# Patient Record
Sex: Male | Born: 1965 | Race: White | Hispanic: No | Marital: Married | Smoking: Current every day smoker
Health system: Southern US, Community
[De-identification: ages and names within clinical notes are randomized; demographics above are authoritative.]

## PROBLEM LIST (undated history)

## (undated) DIAGNOSIS — K219 Gastro-esophageal reflux disease without esophagitis: Secondary | ICD-10-CM

## (undated) DIAGNOSIS — J189 Pneumonia, unspecified organism: Secondary | ICD-10-CM

## (undated) DIAGNOSIS — M542 Cervicalgia: Secondary | ICD-10-CM

## (undated) DIAGNOSIS — R200 Anesthesia of skin: Secondary | ICD-10-CM

## (undated) DIAGNOSIS — J45909 Unspecified asthma, uncomplicated: Secondary | ICD-10-CM

## (undated) DIAGNOSIS — F419 Anxiety disorder, unspecified: Secondary | ICD-10-CM

## (undated) DIAGNOSIS — I509 Heart failure, unspecified: Secondary | ICD-10-CM

## (undated) DIAGNOSIS — I10 Essential (primary) hypertension: Secondary | ICD-10-CM

## (undated) DIAGNOSIS — D518 Other vitamin B12 deficiency anemias: Secondary | ICD-10-CM

## (undated) DIAGNOSIS — E785 Hyperlipidemia, unspecified: Secondary | ICD-10-CM

## (undated) DIAGNOSIS — J439 Emphysema, unspecified: Secondary | ICD-10-CM

## (undated) DIAGNOSIS — G629 Polyneuropathy, unspecified: Secondary | ICD-10-CM

## (undated) DIAGNOSIS — G2581 Restless legs syndrome: Secondary | ICD-10-CM

## (undated) DIAGNOSIS — R202 Paresthesia of skin: Secondary | ICD-10-CM

## (undated) DIAGNOSIS — J449 Chronic obstructive pulmonary disease, unspecified: Secondary | ICD-10-CM

## (undated) DIAGNOSIS — G4700 Insomnia, unspecified: Secondary | ICD-10-CM

## (undated) DIAGNOSIS — G894 Chronic pain syndrome: Secondary | ICD-10-CM

## (undated) HISTORY — PX: OTHER SURGICAL HISTORY: SHX169

## (undated) HISTORY — DX: Restless legs syndrome: G25.81

## (undated) HISTORY — DX: Polyneuropathy, unspecified: G62.9

## (undated) HISTORY — DX: Heart failure, unspecified: I50.9

## (undated) HISTORY — DX: Cervicalgia: M54.2

## (undated) HISTORY — DX: Anxiety disorder, unspecified: F41.9

## (undated) HISTORY — DX: Essential (primary) hypertension: I10

## (undated) HISTORY — DX: Anesthesia of skin: R20.2

## (undated) HISTORY — DX: Anesthesia of skin: R20.0

## (undated) HISTORY — DX: Hyperlipidemia, unspecified: E78.5

## (undated) HISTORY — DX: Chronic pain syndrome: G89.4

## (undated) HISTORY — DX: Other vitamin B12 deficiency anemias: D51.8

## (undated) HISTORY — DX: Insomnia, unspecified: G47.00

## (undated) HISTORY — PX: APPENDECTOMY: SHX54

## (undated) HISTORY — DX: Gastro-esophageal reflux disease without esophagitis: K21.9

---

## 2005-08-30 HISTORY — PX: APPENDECTOMY: SHX54

## 2006-08-30 HISTORY — PX: BACK SURGERY: SHX140

## 2006-08-30 HISTORY — PX: ELBOW SURGERY: SHX618

## 2013-02-28 ENCOUNTER — Other Ambulatory Visit: Payer: Self-pay | Admitting: Neurosurgery

## 2013-02-28 DIAGNOSIS — M5412 Radiculopathy, cervical region: Secondary | ICD-10-CM

## 2013-03-07 ENCOUNTER — Ambulatory Visit
Admission: RE | Admit: 2013-03-07 | Discharge: 2013-03-07 | Disposition: A | Discharge status: Home / Self Care | Payer: Medicaid Other | Source: Ambulatory Visit | Attending: Neurosurgery | Admitting: Neurosurgery

## 2013-03-07 VITALS — BP 113/72 | HR 63

## 2013-03-07 DIAGNOSIS — M5412 Radiculopathy, cervical region: Secondary | ICD-10-CM

## 2013-03-07 MED ORDER — ONDANSETRON HCL 4 MG/2ML IJ SOLN
4.0000 mg | Freq: Once | INTRAMUSCULAR | Status: AC
  Administered 2013-03-07: 4 mg via INTRAMUSCULAR

## 2013-03-07 MED ORDER — DIAZEPAM 5 MG PO TABS
10.0000 mg | ORAL_TABLET | Freq: Once | ORAL | Status: AC
  Administered 2013-03-07: 10 mg via ORAL

## 2013-03-07 MED ORDER — MEPERIDINE HCL 100 MG/ML IJ SOLN
75.0000 mg | Freq: Once | INTRAMUSCULAR | Status: AC
  Administered 2013-03-07: 75 mg via INTRAMUSCULAR

## 2013-03-07 MED ORDER — IOHEXOL 300 MG/ML  SOLN
10.0000 mL | Freq: Once | INTRAMUSCULAR | Status: AC | PRN
  Administered 2013-03-07: 10 mL via INTRATHECAL

## 2013-03-07 NOTE — Progress Notes (Signed)
Discharge instructions explained to pt and his wife. 

## 2013-05-01 ENCOUNTER — Encounter (HOSPITAL_COMMUNITY): Payer: Self-pay | Admitting: Pharmacy Technician

## 2013-05-01 ENCOUNTER — Other Ambulatory Visit: Payer: Self-pay | Admitting: Neurosurgery

## 2013-05-02 ENCOUNTER — Encounter (HOSPITAL_COMMUNITY): Payer: Self-pay | Admitting: *Deleted

## 2013-05-03 ENCOUNTER — Ambulatory Visit (HOSPITAL_COMMUNITY)
Admission: RE | Admit: 2013-05-03 | Discharge: 2013-05-04 | Disposition: A | Discharge status: Home / Self Care | Payer: Medicaid Other | Source: Ambulatory Visit | Attending: Neurosurgery | Admitting: Neurosurgery

## 2013-05-03 ENCOUNTER — Encounter (HOSPITAL_COMMUNITY): Payer: Self-pay | Admitting: Anesthesiology

## 2013-05-03 ENCOUNTER — Encounter (HOSPITAL_COMMUNITY): Payer: Self-pay | Admitting: *Deleted

## 2013-05-03 ENCOUNTER — Ambulatory Visit (HOSPITAL_COMMUNITY): Payer: Medicaid Other

## 2013-05-03 ENCOUNTER — Encounter (HOSPITAL_COMMUNITY)
Admission: RE | Disposition: A | Discharge status: Home / Self Care | Payer: Medicaid Other | Source: Ambulatory Visit | Attending: Neurosurgery

## 2013-05-03 ENCOUNTER — Ambulatory Visit (HOSPITAL_COMMUNITY): Payer: Medicaid Other | Admitting: Anesthesiology

## 2013-05-03 DIAGNOSIS — M5020 Other cervical disc displacement, unspecified cervical region: Secondary | ICD-10-CM

## 2013-05-03 DIAGNOSIS — M47812 Spondylosis without myelopathy or radiculopathy, cervical region: Secondary | ICD-10-CM | POA: Insufficient documentation

## 2013-05-03 DIAGNOSIS — J449 Chronic obstructive pulmonary disease, unspecified: Secondary | ICD-10-CM | POA: Insufficient documentation

## 2013-05-03 DIAGNOSIS — J4489 Other specified chronic obstructive pulmonary disease: Secondary | ICD-10-CM | POA: Insufficient documentation

## 2013-05-03 HISTORY — DX: Unspecified asthma, uncomplicated: J45.909

## 2013-05-03 HISTORY — PX: ANTERIOR CERVICAL DECOMP/DISCECTOMY FUSION: SHX1161

## 2013-05-03 HISTORY — DX: Emphysema, unspecified: J43.9

## 2013-05-03 HISTORY — DX: Chronic obstructive pulmonary disease, unspecified: J44.9

## 2013-05-03 LAB — CBC
HCT: 44.4 % (ref 39.0–52.0)
Hemoglobin: 16 g/dL (ref 13.0–17.0)
MCH: 31.1 pg (ref 26.0–34.0)
MCHC: 36 g/dL (ref 30.0–36.0)

## 2013-05-03 LAB — BASIC METABOLIC PANEL
BUN: 14 mg/dL (ref 6–23)
GFR calc non Af Amer: >90 mL/min (ref >90)
Glucose, Bld: 101 mg/dL — ABNORMAL HIGH (ref 70–99)
Potassium: 4.2 mEq/L (ref 3.5–5.1)

## 2013-05-03 SURGERY — ANTERIOR CERVICAL DECOMPRESSION/DISCECTOMY FUSION 1 LEVEL
Anesthesia: General | Wound class: Clean

## 2013-05-03 MED ORDER — HYDROMORPHONE HCL PF 1 MG/ML IJ SOLN
0.2500 mg | INTRAMUSCULAR | Status: DC | PRN
  Administered 2013-05-03 (×4): 0.5 mg via INTRAVENOUS

## 2013-05-03 MED ORDER — MOMETASONE FURO-FORMOTEROL FUM 100-5 MCG/ACT IN AERO
2.0000 | INHALATION_SPRAY | Freq: Two times a day (BID) | RESPIRATORY_TRACT | Status: DC
  Administered 2013-05-03 – 2013-05-04 (×2): 2 via RESPIRATORY_TRACT
  Filled 2013-05-03 (×2): qty 8.8

## 2013-05-03 MED ORDER — HEMOSTATIC AGENTS (NO CHARGE) OPTIME
TOPICAL | Status: DC | PRN
  Administered 2013-05-03: 1 via TOPICAL

## 2013-05-03 MED ORDER — ROCURONIUM BROMIDE 100 MG/10ML IV SOLN
INTRAVENOUS | Status: DC | PRN
  Administered 2013-05-03: 50 mg via INTRAVENOUS

## 2013-05-03 MED ORDER — PHENOL 1.4 % MT LIQD: 1.0000 | OROMUCOSAL | Status: DC | PRN

## 2013-05-03 MED ORDER — CYCLOBENZAPRINE HCL 10 MG PO TABS
ORAL_TABLET | ORAL | Status: AC
  Filled 2013-05-03: qty 1

## 2013-05-03 MED ORDER — MIDAZOLAM HCL 5 MG/5ML IJ SOLN
INTRAMUSCULAR | Status: DC | PRN
  Administered 2013-05-03: 2 mg via INTRAVENOUS

## 2013-05-03 MED ORDER — ONDANSETRON HCL 4 MG/2ML IJ SOLN: 4.0000 mg | Freq: Once | INTRAMUSCULAR | Status: DC | PRN

## 2013-05-03 MED ORDER — SODIUM CHLORIDE 0.9 % IJ SOLN: 3.0000 mL | INTRAMUSCULAR | Status: DC | PRN

## 2013-05-03 MED ORDER — NEOSTIGMINE METHYLSULFATE 1 MG/ML IJ SOLN
INTRAMUSCULAR | Status: DC | PRN
  Administered 2013-05-03: 4 mg via INTRAVENOUS

## 2013-05-03 MED ORDER — PHENYLEPHRINE HCL 10 MG/ML IJ SOLN
INTRAMUSCULAR | Status: DC | PRN
  Administered 2013-05-03: 40 ug via INTRAVENOUS

## 2013-05-03 MED ORDER — FENTANYL CITRATE 0.05 MG/ML IJ SOLN
INTRAMUSCULAR | Status: DC | PRN
  Administered 2013-05-03: 100 ug via INTRAVENOUS
  Administered 2013-05-03: 50 ug via INTRAVENOUS
  Administered 2013-05-03: 100 ug via INTRAVENOUS
  Administered 2013-05-03: 50 ug via INTRAVENOUS

## 2013-05-03 MED ORDER — OXYCODONE HCL 5 MG PO TABS
5.0000 mg | ORAL_TABLET | Freq: Once | ORAL | Status: AC | PRN
  Administered 2013-05-03: 5 mg via ORAL

## 2013-05-03 MED ORDER — ZOLPIDEM TARTRATE 5 MG PO TABS: 5.0000 mg | ORAL_TABLET | Freq: Every evening | ORAL | Status: DC | PRN

## 2013-05-03 MED ORDER — HYDROMORPHONE HCL PF 1 MG/ML IJ SOLN
INTRAMUSCULAR | Status: AC
  Filled 2013-05-03: qty 1

## 2013-05-03 MED ORDER — 0.9 % SODIUM CHLORIDE (POUR BTL) OPTIME
TOPICAL | Status: DC | PRN
  Administered 2013-05-03: 1000 mL

## 2013-05-03 MED ORDER — CEFAZOLIN SODIUM-DEXTROSE 2-3 GM-% IV SOLR
INTRAVENOUS | Status: AC
  Administered 2013-05-03: 2 g via INTRAVENOUS
  Filled 2013-05-03: qty 50

## 2013-05-03 MED ORDER — ALBUTEROL SULFATE HFA 108 (90 BASE) MCG/ACT IN AERS
INHALATION_SPRAY | RESPIRATORY_TRACT | Status: DC | PRN
  Administered 2013-05-03: 4 via RESPIRATORY_TRACT

## 2013-05-03 MED ORDER — THROMBIN 5000 UNITS EX SOLR
CUTANEOUS | Status: DC | PRN
  Administered 2013-05-03 (×2): 5000 [IU] via TOPICAL

## 2013-05-03 MED ORDER — CEFAZOLIN SODIUM-DEXTROSE 2-3 GM-% IV SOLR
2.0000 g | Freq: Three times a day (TID) | INTRAVENOUS | Status: AC
  Administered 2013-05-03 – 2013-05-04 (×2): 2 g via INTRAVENOUS
  Filled 2013-05-03 (×2): qty 50

## 2013-05-03 MED ORDER — ACETAMINOPHEN 650 MG RE SUPP: 650.0000 mg | RECTAL | Status: DC | PRN

## 2013-05-03 MED ORDER — MEPERIDINE HCL 25 MG/ML IJ SOLN: 6.2500 mg | INTRAMUSCULAR | Status: DC | PRN

## 2013-05-03 MED ORDER — HYDROMORPHONE HCL PF 1 MG/ML IJ SOLN
1.0000 mg | INTRAMUSCULAR | Status: DC | PRN
  Administered 2013-05-03 – 2013-05-04 (×3): 1 mg via INTRAMUSCULAR
  Filled 2013-05-03 (×3): qty 1

## 2013-05-03 MED ORDER — VECURONIUM BROMIDE 10 MG IV SOLR
INTRAVENOUS | Status: DC | PRN
  Administered 2013-05-03: 2 mg via INTRAVENOUS

## 2013-05-03 MED ORDER — PROPOFOL 10 MG/ML IV BOLUS
INTRAVENOUS | Status: DC | PRN
  Administered 2013-05-03: 120 mg via INTRAVENOUS

## 2013-05-03 MED ORDER — SODIUM CHLORIDE 0.9 % IJ SOLN
3.0000 mL | Freq: Two times a day (BID) | INTRAMUSCULAR | Status: DC
  Administered 2013-05-03: 3 mL via INTRAVENOUS

## 2013-05-03 MED ORDER — DEXAMETHASONE SODIUM PHOSPHATE 4 MG/ML IJ SOLN
4.0000 mg | Freq: Four times a day (QID) | INTRAMUSCULAR | Status: AC
  Administered 2013-05-03: 4 mg via INTRAVENOUS
  Filled 2013-05-03: qty 1

## 2013-05-03 MED ORDER — OXYCODONE HCL 5 MG PO TABS
ORAL_TABLET | ORAL | Status: AC
  Filled 2013-05-03: qty 1

## 2013-05-03 MED ORDER — LACTATED RINGERS IV SOLN
INTRAVENOUS | Status: DC | PRN
  Administered 2013-05-03 (×2): via INTRAVENOUS

## 2013-05-03 MED ORDER — CYCLOBENZAPRINE HCL 10 MG PO TABS
10.0000 mg | ORAL_TABLET | Freq: Three times a day (TID) | ORAL | Status: DC | PRN
  Administered 2013-05-03 – 2013-05-04 (×2): 10 mg via ORAL
  Filled 2013-05-03: qty 1

## 2013-05-03 MED ORDER — MUPIROCIN 2 % EX OINT
TOPICAL_OINTMENT | CUTANEOUS | Status: AC
  Filled 2013-05-03: qty 22

## 2013-05-03 MED ORDER — ROFLUMILAST 500 MCG PO TABS
500.0000 ug | ORAL_TABLET | Freq: Every day | ORAL | Status: DC
  Administered 2013-05-03 – 2013-05-04 (×2): 500 ug via ORAL
  Filled 2013-05-03 (×2): qty 1

## 2013-05-03 MED ORDER — ALBUTEROL SULFATE (5 MG/ML) 0.5% IN NEBU
2.5000 mg | INHALATION_SOLUTION | Freq: Four times a day (QID) | RESPIRATORY_TRACT | Status: DC | PRN
  Administered 2013-05-04: 2.5 mg via RESPIRATORY_TRACT
  Filled 2013-05-03: qty 0.5

## 2013-05-03 MED ORDER — KCL IN DEXTROSE-NACL 20-5-0.45 MEQ/L-%-% IV SOLN
80.0000 mL/h | INTRAVENOUS | Status: DC
  Administered 2013-05-03: 80 mL/h via INTRAVENOUS
  Filled 2013-05-03 (×3): qty 1000

## 2013-05-03 MED ORDER — HYDROCODONE-ACETAMINOPHEN 5-325 MG PO TABS
1.0000 | ORAL_TABLET | ORAL | Status: DC | PRN
  Administered 2013-05-03 – 2013-05-04 (×4): 2 via ORAL
  Filled 2013-05-03 (×4): qty 2

## 2013-05-03 MED ORDER — ACETAMINOPHEN 325 MG PO TABS: 650.0000 mg | ORAL_TABLET | ORAL | Status: DC | PRN

## 2013-05-03 MED ORDER — MENTHOL 3 MG MT LOZG: 1.0000 | LOZENGE | OROMUCOSAL | Status: DC | PRN

## 2013-05-03 MED ORDER — DEXAMETHASONE 4 MG PO TABS
4.0000 mg | ORAL_TABLET | Freq: Four times a day (QID) | ORAL | Status: AC
  Administered 2013-05-04: 4 mg via ORAL
  Filled 2013-05-03 (×2): qty 1

## 2013-05-03 MED ORDER — SODIUM CHLORIDE 0.9 % IR SOLN
Status: DC | PRN
  Administered 2013-05-03: 16:00:00

## 2013-05-03 MED ORDER — GLYCOPYRROLATE 0.2 MG/ML IJ SOLN
INTRAMUSCULAR | Status: DC | PRN
  Administered 2013-05-03: 0.6 mg via INTRAVENOUS

## 2013-05-03 MED ORDER — ALBUTEROL SULFATE HFA 108 (90 BASE) MCG/ACT IN AERS
2.0000 | INHALATION_SPRAY | Freq: Four times a day (QID) | RESPIRATORY_TRACT | Status: DC | PRN
  Filled 2013-05-03: qty 6.7

## 2013-05-03 MED ORDER — ONDANSETRON HCL 4 MG/2ML IJ SOLN: 4.0000 mg | INTRAMUSCULAR | Status: DC | PRN

## 2013-05-03 MED ORDER — IPRATROPIUM BROMIDE 0.02 % IN SOLN
500.0000 ug | Freq: Four times a day (QID) | RESPIRATORY_TRACT | Status: DC | PRN
  Administered 2013-05-04: 500 ug via RESPIRATORY_TRACT
  Filled 2013-05-03: qty 2.5

## 2013-05-03 MED ORDER — ALPRAZOLAM 0.5 MG PO TABS
0.5000 mg | ORAL_TABLET | Freq: Three times a day (TID) | ORAL | Status: DC
  Administered 2013-05-03 – 2013-05-04 (×2): 0.5 mg via ORAL
  Filled 2013-05-03 (×2): qty 1

## 2013-05-03 MED ORDER — LIDOCAINE HCL (CARDIAC) 20 MG/ML IV SOLN
INTRAVENOUS | Status: DC | PRN
  Administered 2013-05-03: 80 mg via INTRATRACHEAL
  Administered 2013-05-03: 100 mg via INTRAVENOUS

## 2013-05-03 MED ORDER — MUPIROCIN CALCIUM 2 % EX CREA
TOPICAL_CREAM | Freq: Two times a day (BID) | CUTANEOUS | Status: DC
  Filled 2013-05-03: qty 15

## 2013-05-03 MED ORDER — OXYCODONE HCL 5 MG/5ML PO SOLN: 5.0000 mg | Freq: Once | ORAL | Status: AC | PRN

## 2013-05-03 MED ORDER — SODIUM CHLORIDE 0.9 % IV SOLN: 250.0000 mL | INTRAVENOUS | Status: DC

## 2013-05-03 SURGICAL SUPPLY — 62 items
BAG DECANTER FOR FLEXI CONT (MISCELLANEOUS) ×2 IMPLANT
BENZOIN TINCTURE PRP APPL 2/3 (GAUZE/BANDAGES/DRESSINGS) ×2 IMPLANT
BIT DRILL TRINICA 2.3MM (BIT) ×1 IMPLANT
BRUSH SCRUB EZ PLAIN DRY (MISCELLANEOUS) ×2 IMPLANT
BUR MATCHSTICK NEURO 3.0 LAGG (BURR) ×2 IMPLANT
CANISTER SUCTION 2500CC (MISCELLANEOUS) ×2 IMPLANT
CLOTH BEACON ORANGE TIMEOUT ST (SAFETY) ×2 IMPLANT
CONT SPEC 4OZ CLIKSEAL STRL BL (MISCELLANEOUS) ×2 IMPLANT
DERMABOND ADVANCED (GAUZE/BANDAGES/DRESSINGS) ×1
DERMABOND ADVANCED .7 DNX12 (GAUZE/BANDAGES/DRESSINGS) ×1 IMPLANT
DRAPE C-ARM 42X72 X-RAY (DRAPES) ×4 IMPLANT
DRAPE LAPAROTOMY 100X72 PEDS (DRAPES) ×2 IMPLANT
DRAPE MICROSCOPE LEICA (MISCELLANEOUS) ×2 IMPLANT
DRAPE MICROSCOPE ZEISS OPMI (DRAPES) IMPLANT
DRAPE POUCH INSTRU U-SHP 10X18 (DRAPES) ×2 IMPLANT
DRAPE SURG 17X23 STRL (DRAPES) ×4 IMPLANT
DRESSING TELFA 8X3 (GAUZE/BANDAGES/DRESSINGS) ×2 IMPLANT
DRILL BIT TRINICA 2.3MM (BIT) ×2
DRSG OPSITE POSTOP 3X4 (GAUZE/BANDAGES/DRESSINGS) ×2 IMPLANT
DURAPREP 6ML APPLICATOR 50/CS (WOUND CARE) ×2 IMPLANT
ELECT COATED BLADE 2.86 ST (ELECTRODE) ×2 IMPLANT
ELECT REM PT RETURN 9FT ADLT (ELECTROSURGICAL) ×2
ELECTRODE REM PT RTRN 9FT ADLT (ELECTROSURGICAL) ×1 IMPLANT
GAUZE SPONGE 4X4 16PLY XRAY LF (GAUZE/BANDAGES/DRESSINGS) IMPLANT
GLOVE BIO SURGEON STRL SZ 6.5 (GLOVE) ×4 IMPLANT
GLOVE BIO SURGEON STRL SZ8 (GLOVE) ×2 IMPLANT
GLOVE BIOGEL PI IND STRL 6.5 (GLOVE) ×1 IMPLANT
GLOVE BIOGEL PI IND STRL 8.5 (GLOVE) ×1 IMPLANT
GLOVE BIOGEL PI INDICATOR 6.5 (GLOVE) ×1
GLOVE BIOGEL PI INDICATOR 8.5 (GLOVE) ×1
GLOVE ECLIPSE 8.0 STRL XLNG CF (GLOVE) ×2 IMPLANT
GLOVE EXAM NITRILE LRG STRL (GLOVE) IMPLANT
GLOVE EXAM NITRILE XL STR (GLOVE) IMPLANT
GLOVE EXAM NITRILE XS STR PU (GLOVE) IMPLANT
GOWN BRE IMP SLV AUR LG STRL (GOWN DISPOSABLE) ×2 IMPLANT
GOWN BRE IMP SLV AUR XL STRL (GOWN DISPOSABLE) ×4 IMPLANT
GOWN STRL REIN 2XL LVL4 (GOWN DISPOSABLE) IMPLANT
HEAD HALTER (SOFTGOODS) ×2 IMPLANT
INTERBODY TM 11X14X5-7DEG ANG (Metal Cage) ×2 IMPLANT
KIT BASIN OR (CUSTOM PROCEDURE TRAY) ×2 IMPLANT
KIT ROOM TURNOVER OR (KITS) ×2 IMPLANT
NEEDLE SPNL 20GX3.5 QUINCKE YW (NEEDLE) ×2 IMPLANT
NS IRRIG 1000ML POUR BTL (IV SOLUTION) ×2 IMPLANT
PACK LAMINECTOMY NEURO (CUSTOM PROCEDURE TRAY) ×2 IMPLANT
PAD ARMBOARD 7.5X6 YLW CONV (MISCELLANEOUS) ×2 IMPLANT
PATTIES SURGICAL .25X.25 (GAUZE/BANDAGES/DRESSINGS) IMPLANT
PATTIES SURGICAL .75X.75 (GAUZE/BANDAGES/DRESSINGS) ×2 IMPLANT
PLATE 22MM (Plate) ×2 IMPLANT
PUTTY BONE GRAFT KIT 2.5ML (Bone Implant) ×2 IMPLANT
RUBBERBAND STERILE (MISCELLANEOUS) ×4 IMPLANT
SCREW SELF DRILL FIXED 14MM (Screw) ×8 IMPLANT
SPONGE GAUZE 4X4 12PLY (GAUZE/BANDAGES/DRESSINGS) ×2 IMPLANT
SPONGE INTESTINAL PEANUT (DISPOSABLE) ×2 IMPLANT
SPONGE SURGIFOAM ABS GEL SZ50 (HEMOSTASIS) ×2 IMPLANT
STRIP CLOSURE SKIN 1/2X4 (GAUZE/BANDAGES/DRESSINGS) ×2 IMPLANT
SUT PDS AB 5-0 P3 18 (SUTURE) ×2 IMPLANT
SUT VIC AB 3-0 CP2 18 (SUTURE) ×2 IMPLANT
SYR 20ML ECCENTRIC (SYRINGE) ×2 IMPLANT
TOWEL OR 17X24 6PK STRL BLUE (TOWEL DISPOSABLE) ×2 IMPLANT
TOWEL OR 17X26 10 PK STRL BLUE (TOWEL DISPOSABLE) ×2 IMPLANT
TRAP SPECIMEN MUCOUS 40CC (MISCELLANEOUS) IMPLANT
WATER STERILE IRR 1000ML POUR (IV SOLUTION) ×2 IMPLANT

## 2013-05-03 NOTE — Op Note (Signed)
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 Localizing fluoroscopy was used prior to incision to identify the appropriate level. Transverse incision was made in the right anterior neck started the midline and headed towards the medial aspect of the sternocleidomastoid muscle. The platysma muscle was incised transversely and the natural fascial plane between the strap muscles medially and the sternal cremaster laterally was identified and followed down to the anterior aspect the cervical spine. Longus Cole muscles were identified split in the midline to play bilaterally with unipolar coagulation and Kitner dissection. Subcutaneous tract was placed for exposure and x-ray showed approach the appropriate level. Using a 15 blade the Buxton disc at C3-4 was incised. Using pituitary rongeurs and curettes approximately 90% of the disc material was removed. High-speed drill was used to widen the interspace and bony shavings were saved for use later in the case. At this time the microscope was draped brought in the field and used for the remainder of the case. Using microdissection technique the remainder of the disc material down the posterior longitudinal ligament was removed. Ligament was then incised transversely and the cut edges removed a Kerrison punch. Thorough decompression was carried out on the spinal dura into the foramen bilaterally particularly on the right, symptomatic side where aggressive decompression was carried out of the underlying C4 nerve root. At this time inspection was carried out in all directions for any evidence of residual compression and none could be identified.  Irrigation was carried out and any bleeding control proper coagulation Gelfoam. Measurements were taken and a 5 mm lordotic trabecular metal graft was chosen and filled with a mixture of autologous bone morselized allograft. After irrigated once more and confirming hemostasis the plug was impacted difficulty and fossae showed to be in excellent position. An appropriately length Trinica anterior cervical plate was then chosen. Under fluoroscopic guidance drill holes were placed followed by placing of 14 mm screws x4. Locking mechanism was rotated locked position and final fluoroscopy showed excellent position of the plates screws and plugs. Irrigation was carried out and any bleeding control proper coagulation. The was then closed with inverted Vicryl on the platysma slow the subcuticular layer and Steri-Strips on the skin. A sterile dressing a soft collar applied and the patient was extubated and taken to recovery room in stable condition.

## 2013-05-03 NOTE — Transfer of Care (Signed)
Immediate Anesthesia Transfer of Care Note  Patient: Jason Ballard  Procedure(s) Performed: Procedure(s): ANTERIOR CERVICAL DECOMPRESSION/DISCECTOMY FUSION Cervical Three-Four (N/A)  Patient Location: PACU  Anesthesia Type:General  Level of Consciousness: awake, alert  and oriented  Airway & Oxygen Therapy: Patient Spontanous Breathing and Patient connected to nasal cannula oxygen  Post-op Assessment: Report given to PACU RN and Post -op Vital signs reviewed and stable  Post vital signs: Reviewed and stable  Complications: No apparent anesthesia complications

## 2013-05-03 NOTE — Anesthesia Preprocedure Evaluation (Signed)
Anesthesia Evaluation  Patient identified by MRN, date of birth, ID band Patient awake    Reviewed: Allergy & Precautions, H&P , NPO status , Patient's Chart, lab work & pertinent test results  Airway Mallampati: I TM Distance: >3 FB Neck ROM: Full    Dental   Pulmonary COPD         Cardiovascular     Neuro/Psych    GI/Hepatic   Endo/Other    Renal/GU      Musculoskeletal   Abdominal   Peds  Hematology   Anesthesia Other Findings   Reproductive/Obstetrics                           Anesthesia Physical Anesthesia Plan  ASA: III  Anesthesia Plan: General   Post-op Pain Management:    Induction: Intravenous  Airway Management Planned: Oral ETT  Additional Equipment:   Intra-op Plan:   Post-operative Plan: Extubation in OR  Informed Consent: I have reviewed the patients History and Physical, chart, labs and discussed the procedure including the risks, benefits and alternatives for the proposed anesthesia with the patient or authorized representative who has indicated his/her understanding and acceptance.     Plan Discussed with: CRNA and Surgeon  Anesthesia Plan Comments:         Anesthesia Quick Evaluation

## 2013-05-03 NOTE — Progress Notes (Signed)
Orthopedic Tech Progress Note Patient Details:  Jibri Schriefer Jan 30, 1966 [id number]  Ortho Devices Type of Ortho Device: Soft collar Ortho Device/Splint Location: neck Ortho Device/Splint Interventions: Ordered;Application   Jennye Moccasin 05/03/2013, 6:15 PM

## 2013-05-03 NOTE — H&P (Signed)
Jason Ballard is an 47 y.o. male.   Chief Complaint: Neck pain into the right arm HPI: The patient is a 47 year old gentleman who was evaluated in the office for neck and radiation the right arm and some cramping of his hands. He's had this problem for 18 months. He had an anterior cervical discectomy 2008 which occurred report we 6 months after the accident. Initially did better after surgery but has not been getting worse for the last 18 months. He is seen in a pain clinic [place] MRI scan of the neck was done is referred for evaluation. Was in the office his left arm is asymptomatic. The MRI scan was reviewed which is that of a fairly unremarkable. Because of the persistent difficulty and went myelography which showed nerve root impingement at C3-4 on the right side. This level was clinical problem. Discussed the options the patient requested surgery and now comes for an anterior cervical discectomy with fusion and plating. I had a long discussion with him regarding the risks and benefits of surgical intervention. The risks discussed include but are not limited to bleeding infection weakness and also spinal fluid leak coma quadriplegia hoarseness and death. We've discussed alternative methods of therapy although risks and benefits of nonintervention. He's had the opportunity to ask numerous questions and appears to understand. With this information in hand he has requested we proceed with surgery.  Past Medical History  Diagnosis Date  . COPD (chronic obstructive pulmonary disease)   . Asthma   . Emphysema     Past Surgical History  Procedure Laterality Date  . Back surgery  2008    neck surgery  . Nerve replacement in right elbow, 2008    . Appendectomy      History reviewed. No pertinent family history. Social History:  reports that he quit smoking about 8 months ago. His smoking use included Cigarettes. He has a 33 pack-year smoking history. He has never used smokeless tobacco. He  reports that  drinks alcohol. He reports that he does not use illicit drugs.  Allergies: No Known Allergies  Medications Prior to Admission  Medication Sig Dispense Refill  . Aclidinium Bromide (TUDORZA PRESSAIR) 400 MCG/ACT AEPB Inhale 2 puffs into the lungs 2 (two) times daily.      Marland Kitchen albuterol (PROVENTIL HFA;VENTOLIN HFA) 108 (90 BASE) MCG/ACT inhaler Inhale 2 puffs into the lungs every 6 (six) hours as needed for wheezing.      Marland Kitchen albuterol (PROVENTIL) (2.5 MG/3ML) 0.083% nebulizer solution Take 2.5 mg by nebulization every 6 (six) hours as needed for wheezing.      Marland Kitchen ALPRAZolam (XANAX) 0.5 MG tablet Take 0.5 mg by mouth 3 (three) times daily.      Marland Kitchen HYDROcodone-acetaminophen (NORCO/VICODIN) 5-325 MG per tablet Take 1 tablet by mouth 3 (three) times daily.      Marland Kitchen ipratropium (ATROVENT) 0.02 % nebulizer solution Take 500 mcg by nebulization every 6 (six) hours as needed for wheezing.      . mometasone-formoterol (DULERA) 100-5 MCG/ACT AERO Inhale 2 puffs into the lungs 2 (two) times daily.      . roflumilast (DALIRESP) 500 MCG TABS tablet Take 500 mcg by mouth daily.        Results for orders placed during the hospital encounter of 05/03/13 (from the past 48 hour(s))  CBC     Status: None   Collection Time    05/03/13  1:02 PM      Result Value Range   WBC 8.8  4.0 - 10.5 K/uL   RBC 5.14  4.22 - 5.81 MIL/uL   Hemoglobin 16.0  13.0 - 17.0 g/dL   HCT 45.4  09.8 - 11.9 %   MCV 86.4  78.0 - 100.0 fL   MCH 31.1  26.0 - 34.0 pg   MCHC 36.0  30.0 - 36.0 g/dL   RDW 14.7  82.9 - 56.2 %   Platelets 317  150 - 400 K/uL  BASIC METABOLIC PANEL     Status: Abnormal   Collection Time    05/03/13  1:02 PM      Result Value Range   Sodium 137  135 - 145 mEq/L   Potassium 4.2  3.5 - 5.1 mEq/L   Chloride 102  96 - 112 mEq/L   CO2 27  19 - 32 mEq/L   Glucose, Bld 101 (*) 70 - 99 mg/dL   BUN 14  6 - 23 mg/dL   Creatinine, Ser 1.30  0.50 - 1.35 mg/dL   Calcium 9.4  8.4 - 86.5 mg/dL   GFR calc  non Af Amer >90  >90 mL/min   GFR calc Af Amer >90  >90 mL/min   Comment: (NOTE)     The eGFR has been calculated using the CKD EPI equation.     This calculation has not been validated in all clinical situations.     eGFR's persistently <90 mL/min signify possible Chronic Kidney     Disease.   Dg Chest 2 View  05/03/2013   CLINICAL DATA:  COPD. Preop respiratory exam.  EXAM: CHEST  2 VIEW  COMPARISON:  None.  FINDINGS: Pulmonary hyperinflation seen, consistent with COPD. No evidence of pulmonary infiltrate or edema. No evidence of pleural effusion. Heart size and mediastinal contours are normal.  IMPRESSION: COPD. No active disease.   Electronically Signed   By: Myles Rosenthal   On: 05/03/2013 13:14    Review of systems is positive for asthma emphysema shortness of breath loss of arm weakness because of problems with sinus problem chest pain high blood pressure gastrointestinal or genitourinary problems  Blood pressure 133/91, pulse 70, temperature 96.7 F (35.9 C), temperature source Oral, resp. rate 18, SpO2 97.00%.  The patient is awake alert and oriented. His no facial asymmetry. His gait is nonantalgic. His decreased reflexes on the left upper extremity. His sensation is intact. He is normal strength. 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 Plan is for a C3-4 anterior cervical discectomy with fusion and plating.  Reinaldo Meeker, MD 05/03/2013, 3:15 PM

## 2013-05-03 NOTE — Anesthesia Procedure Notes (Signed)
Procedure Name: Intubation Date/Time: 05/03/2013 3:51 PM Performed by: Lovie Chol Pre-anesthesia Checklist: Patient identified, Emergency Drugs available, Suction available, Patient being monitored and Timeout performed Patient Re-evaluated:Patient Re-evaluated prior to inductionOxygen Delivery Method: Circle system utilized Preoxygenation: Pre-oxygenation with 100% oxygen Intubation Type: IV induction Ventilation: Mask ventilation without difficulty Laryngoscope Size: Miller and 2 Grade View: Grade I Tube type: Oral Tube size: 7.5 mm Number of attempts: 1 Airway Equipment and Method: Stylet Placement Confirmation: ETT inserted through vocal cords under direct vision,  positive ETCO2,  CO2 detector and breath sounds checked- equal and bilateral Secured at: 22 cm Tube secured with: Tape Dental Injury: Teeth and Oropharynx as per pre-operative assessment  Comments: Tracheal/laryngeal topical anesthesia applied. Lidocaine 2% 80mg  via atomizer.

## 2013-05-03 NOTE — Anesthesia Postprocedure Evaluation (Signed)
Anesthesia Post Note  Patient: Jason Ballard  Procedure(s) Performed: Procedure(s) (LRB): ANTERIOR CERVICAL DECOMPRESSION/DISCECTOMY FUSION Cervical Three-Four (N/A)  Anesthesia type: General  Patient location: PACU  Post pain: Pain level controlled  Post assessment: Patient's Cardiovascular Status Stable  Last Vitals:  Filed Vitals:   05/03/13 1931  BP: 124/72  Pulse: 73  Temp:   Resp: 22    Post vital signs: Reviewed and stable  Level of consciousness: alert  Complications: No apparent anesthesia complications

## 2013-05-03 NOTE — Preoperative (Signed)
Beta Blockers   Reason not to administer Beta Blockers:Not Applicable 

## 2013-05-04 ENCOUNTER — Encounter (HOSPITAL_COMMUNITY): Payer: Self-pay | Admitting: Neurosurgery

## 2013-05-04 MED ORDER — PNEUMOCOCCAL VAC POLYVALENT 25 MCG/0.5ML IJ INJ
0.5000 mL | INJECTION | Freq: Once | INTRAMUSCULAR | Status: AC
  Administered 2013-05-04: 0.5 mL via INTRAMUSCULAR
  Filled 2013-05-04: qty 0.5

## 2013-05-04 MED ORDER — PNEUMOCOCCAL VAC POLYVALENT 25 MCG/0.5ML IJ INJ: 0.5000 mL | INJECTION | INTRAMUSCULAR | Status: DC

## 2013-05-04 MED ORDER — HYDROMORPHONE HCL 4 MG PO TABS: 4.0000 mg | ORAL_TABLET | ORAL | Status: DC | PRN

## 2013-05-04 MED FILL — Mupirocin Oint 2%: CUTANEOUS | Qty: 22 | Status: AC

## 2013-05-04 NOTE — Progress Notes (Signed)
Patient ambulated in the hallway with no assistance. Tolerated very well.

## 2013-05-04 NOTE — Anesthesia Postprocedure Evaluation (Signed)
  Anesthesia Post-op Note  Patient: Jason Ballard  Procedure(s) Performed: Procedure(s): ANTERIOR CERVICAL DECOMPRESSION/DISCECTOMY FUSION Cervical Three-Four (N/A)  Patient discharged from PACU with no apparent anesthetic complications

## 2013-05-04 NOTE — Discharge Summary (Signed)
  Physician Discharge Summary  Patient ID: Jason Ballard MRN: [id number] DOB/AGE: 1965/10/19 47 y.o.  Admit date: 05/03/2013 Discharge date: 05/04/2013  Admission Diagnoses:  Discharge Diagnoses:  Active Problems:   * No active hospital problems. *   Discharged Condition: good  Hospital Course: Surgery one day with one level acdf. Did well. Arm pain much better post op. Wound fine. Ambulated well. Home pod 1, specific instructions given.  Consults: None  Significant Diagnostic Studies: none  Treatments: surgery: C 34 acdf with plating  Discharge Exam: Blood pressure 118/58, pulse 89, temperature 97.2 F (36.2 C), temperature source Axillary, resp. rate 18, height 5\' 9"  (1.753 m), weight 79.833 kg (176 lb), SpO2 95.00%. Incision/Wound:clean and dry; no new neuro issues   Disposition: Final discharge disposition not confirmed     Medication List    ASK your doctor about these medications       albuterol (2.5 MG/3ML) 0.083% nebulizer solution  Commonly known as:  PROVENTIL  Take 2.5 mg by nebulization every 6 (six) hours as needed for wheezing.     albuterol 108 (90 BASE) MCG/ACT inhaler  Commonly known as:  PROVENTIL HFA;VENTOLIN HFA  Inhale 2 puffs into the lungs every 6 (six) hours as needed for wheezing.     ALPRAZolam 0.5 MG tablet  Commonly known as:  XANAX  Take 0.5 mg by mouth 3 (three) times daily.     DALIRESP 500 MCG Tabs tablet  Generic drug:  roflumilast  Take 500 mcg by mouth daily.     DULERA 100-5 MCG/ACT Aero  Generic drug:  mometasone-formoterol  Inhale 2 puffs into the lungs 2 (two) times daily.     HYDROcodone-acetaminophen 5-325 MG per tablet  Commonly known as:  NORCO/VICODIN  Take 1 tablet by mouth 3 (three) times daily.     ipratropium 0.02 % nebulizer solution  Commonly known as:  ATROVENT  Take 500 mcg by nebulization every 6 (six) hours as needed for wheezing.     TUDORZA PRESSAIR 400 MCG/ACT Aepb  Generic drug:  Aclidinium  Bromide  Inhale 2 puffs into the lungs 2 (two) times daily.         At home rest most of the time. Get up 9 or 10 times each day and take a 15 or 20 minute walk. No riding in the car and to your first postoperative appointment. If you have neck surgery you may shower from the chest down starting on the third postoperative day. If you had back surgery he may start showering on the third postoperative day with saran wrap wrapped around your incisional area 3 times. After the shower remove the saran wrap. Take pain medicine as needed and other medications as instructed. Call my office for an appointment.  SignedReinaldo Meeker, MD 05/04/2013, 8:49 AM

## 2014-12-11 ENCOUNTER — Inpatient Hospital Stay (HOSPITAL_COMMUNITY)
Admission: EM | Admit: 2014-12-11 | Discharge: 2014-12-24 | DRG: 163 | Disposition: A | Discharge status: Home / Self Care | Payer: Medicare Other | Source: Other Acute Inpatient Hospital | Attending: Internal Medicine | Admitting: Internal Medicine

## 2014-12-11 ENCOUNTER — Inpatient Hospital Stay (HOSPITAL_COMMUNITY): Payer: Medicare Other

## 2014-12-11 DIAGNOSIS — J441 Chronic obstructive pulmonary disease with (acute) exacerbation: Secondary | ICD-10-CM | POA: Diagnosis present

## 2014-12-11 DIAGNOSIS — J984 Other disorders of lung: Secondary | ICD-10-CM

## 2014-12-11 DIAGNOSIS — G894 Chronic pain syndrome: Secondary | ICD-10-CM | POA: Diagnosis present

## 2014-12-11 DIAGNOSIS — J9383 Other pneumothorax: Secondary | ICD-10-CM | POA: Diagnosis present

## 2014-12-11 DIAGNOSIS — J939 Pneumothorax, unspecified: Secondary | ICD-10-CM

## 2014-12-11 DIAGNOSIS — J9620 Acute and chronic respiratory failure, unspecified whether with hypoxia or hypercapnia: Secondary | ICD-10-CM | POA: Diagnosis present

## 2014-12-11 DIAGNOSIS — I10 Essential (primary) hypertension: Secondary | ICD-10-CM | POA: Diagnosis present

## 2014-12-11 DIAGNOSIS — J95812 Postprocedural air leak: Secondary | ICD-10-CM | POA: Diagnosis present

## 2014-12-11 DIAGNOSIS — J860 Pyothorax with fistula: Secondary | ICD-10-CM | POA: Diagnosis present

## 2014-12-11 DIAGNOSIS — J9621 Acute and chronic respiratory failure with hypoxia: Secondary | ICD-10-CM | POA: Diagnosis not present

## 2014-12-11 DIAGNOSIS — J9382 Other air leak: Secondary | ICD-10-CM | POA: Diagnosis present

## 2014-12-11 DIAGNOSIS — J869 Pyothorax without fistula: Secondary | ICD-10-CM | POA: Diagnosis not present

## 2014-12-11 DIAGNOSIS — T797XXA Traumatic subcutaneous emphysema, initial encounter: Secondary | ICD-10-CM | POA: Diagnosis present

## 2014-12-11 DIAGNOSIS — Z87891 Personal history of nicotine dependence: Secondary | ICD-10-CM | POA: Diagnosis not present

## 2014-12-11 DIAGNOSIS — J9311 Primary spontaneous pneumothorax: Secondary | ICD-10-CM

## 2014-12-11 DIAGNOSIS — R0781 Pleurodynia: Secondary | ICD-10-CM | POA: Diagnosis not present

## 2014-12-11 DIAGNOSIS — R0602 Shortness of breath: Secondary | ICD-10-CM

## 2014-12-11 DIAGNOSIS — S270XXD Traumatic pneumothorax, subsequent encounter: Secondary | ICD-10-CM | POA: Diagnosis not present

## 2014-12-11 DIAGNOSIS — J90 Pleural effusion, not elsewhere classified: Secondary | ICD-10-CM | POA: Diagnosis present

## 2014-12-11 DIAGNOSIS — T797XXD Traumatic subcutaneous emphysema, subsequent encounter: Secondary | ICD-10-CM | POA: Diagnosis not present

## 2014-12-11 DIAGNOSIS — J189 Pneumonia, unspecified organism: Secondary | ICD-10-CM

## 2014-12-11 DIAGNOSIS — Z9889 Other specified postprocedural states: Secondary | ICD-10-CM

## 2014-12-11 DIAGNOSIS — J850 Gangrene and necrosis of lung: Principal | ICD-10-CM | POA: Diagnosis present

## 2014-12-11 DIAGNOSIS — Z9289 Personal history of other medical treatment: Secondary | ICD-10-CM

## 2014-12-11 DIAGNOSIS — Z9689 Presence of other specified functional implants: Secondary | ICD-10-CM

## 2014-12-11 LAB — CBC WITH DIFFERENTIAL/PLATELET
BASOS ABS: 0 10*3/uL (ref 0.0–0.1)
Basophils Relative: 0 % (ref 0–1)
EOS ABS: 0 10*3/uL (ref 0.0–0.7)
EOS PCT: 0 % (ref 0–5)
HCT: 37.9 % — ABNORMAL LOW (ref 39.0–52.0)
Hemoglobin: 12.7 g/dL — ABNORMAL LOW (ref 13.0–17.0)
LYMPHS PCT: 4 % — AB (ref 12–46)
Lymphs Abs: 0.8 10*3/uL (ref 0.7–4.0)
MCH: 30.2 pg (ref 26.0–34.0)
MCHC: 33.5 g/dL (ref 30.0–36.0)
MCV: 90.2 fL (ref 78.0–100.0)
Monocytes Absolute: 0.5 10*3/uL (ref 0.1–1.0)
Monocytes Relative: 3 % (ref 3–12)
NEUTROS PCT: 93 % — AB (ref 43–77)
Neutro Abs: 19 10*3/uL — ABNORMAL HIGH (ref 1.7–7.7)
Platelets: 272 10*3/uL (ref 150–400)
RBC: 4.2 MIL/uL — ABNORMAL LOW (ref 4.22–5.81)
RDW: 13 % (ref 11.5–15.5)
WBC: 20.3 10*3/uL — ABNORMAL HIGH (ref 4.0–10.5)

## 2014-12-11 LAB — COMPREHENSIVE METABOLIC PANEL
ALBUMIN: 2.5 g/dL — AB (ref 3.5–5.2)
ALK PHOS: 72 U/L (ref 39–117)
ALT: 19 U/L (ref 0–53)
AST: 24 U/L (ref 0–37)
Anion gap: 10 (ref 5–15)
BILIRUBIN TOTAL: 0.5 mg/dL (ref 0.3–1.2)
BUN: 17 mg/dL (ref 6–23)
CHLORIDE: 99 mmol/L (ref 96–112)
CO2: 30 mmol/L (ref 19–32)
Calcium: 8.4 mg/dL (ref 8.4–10.5)
Creatinine, Ser: 0.7 mg/dL (ref 0.50–1.35)
GFR calc Af Amer: >90 mL/min (ref >90)
GFR calc non Af Amer: >90 mL/min (ref >90)
Glucose, Bld: 173 mg/dL — ABNORMAL HIGH (ref 70–99)
POTASSIUM: 4.5 mmol/L (ref 3.5–5.1)
Sodium: 139 mmol/L (ref 135–145)
Total Protein: 5.5 g/dL — ABNORMAL LOW (ref 6.0–8.3)

## 2014-12-11 LAB — GLUCOSE, CAPILLARY: Glucose-Capillary: 131 mg/dL — ABNORMAL HIGH (ref 70–99)

## 2014-12-11 LAB — MRSA PCR SCREENING: MRSA by PCR: NEGATIVE

## 2014-12-11 MED ORDER — CETYLPYRIDINIUM CHLORIDE 0.05 % MT LIQD
7.0000 mL | Freq: Two times a day (BID) | OROMUCOSAL | Status: DC
  Administered 2014-12-11 – 2014-12-18 (×15): 7 mL via OROMUCOSAL

## 2014-12-11 MED ORDER — IPRATROPIUM-ALBUTEROL 0.5-2.5 (3) MG/3ML IN SOLN
3.0000 mL | RESPIRATORY_TRACT | Status: DC
  Administered 2014-12-11 – 2014-12-12 (×2): 3 mL via RESPIRATORY_TRACT
  Filled 2014-12-11 (×2): qty 3

## 2014-12-11 MED ORDER — INSULIN ASPART 100 UNIT/ML ~~LOC~~ SOLN: 0.0000 [IU] | Freq: Every day | SUBCUTANEOUS | Status: DC

## 2014-12-11 MED ORDER — ACETAMINOPHEN 650 MG RE SUPP: 650.0000 mg | Freq: Four times a day (QID) | RECTAL | Status: DC | PRN

## 2014-12-11 MED ORDER — ALBUTEROL SULFATE (2.5 MG/3ML) 0.083% IN NEBU: 2.5000 mg | INHALATION_SOLUTION | RESPIRATORY_TRACT | Status: DC | PRN

## 2014-12-11 MED ORDER — GUAIFENESIN 100 MG/5ML PO SYRP
200.0000 mg | ORAL_SOLUTION | ORAL | Status: DC | PRN
  Administered 2014-12-16 (×2): 200 mg via ORAL
  Filled 2014-12-11 (×4): qty 10

## 2014-12-11 MED ORDER — HYDROMORPHONE HCL 1 MG/ML IJ SOLN
0.5000 mg | INTRAMUSCULAR | Status: DC | PRN
  Administered 2014-12-11 (×2): 1 mg via INTRAVENOUS
  Administered 2014-12-11: 0.5 mg via INTRAVENOUS
  Administered 2014-12-12 (×3): 1 mg via INTRAVENOUS
  Filled 2014-12-11 (×6): qty 1

## 2014-12-11 MED ORDER — ROFLUMILAST 500 MCG PO TABS
500.0000 ug | ORAL_TABLET | Freq: Every day | ORAL | Status: DC
  Administered 2014-12-11 – 2014-12-24 (×13): 500 ug via ORAL
  Filled 2014-12-11 (×14): qty 1

## 2014-12-11 MED ORDER — PANTOPRAZOLE SODIUM 40 MG PO TBEC
40.0000 mg | DELAYED_RELEASE_TABLET | Freq: Every day | ORAL | Status: DC
  Administered 2014-12-11 – 2014-12-24 (×13): 40 mg via ORAL
  Filled 2014-12-11 (×13): qty 1

## 2014-12-11 MED ORDER — METHYLPREDNISOLONE SODIUM SUCC 40 MG IJ SOLR
40.0000 mg | Freq: Four times a day (QID) | INTRAMUSCULAR | Status: DC
  Administered 2014-12-11: 40 mg via INTRAVENOUS
  Filled 2014-12-11 (×2): qty 1

## 2014-12-11 MED ORDER — SODIUM CHLORIDE 0.9 % IV SOLN: INTRAVENOUS | Status: DC

## 2014-12-11 MED ORDER — MOMETASONE FURO-FORMOTEROL FUM 100-5 MCG/ACT IN AERO
2.0000 | INHALATION_SPRAY | Freq: Two times a day (BID) | RESPIRATORY_TRACT | Status: DC
  Filled 2014-12-11: qty 8.8

## 2014-12-11 MED ORDER — ACETAMINOPHEN 325 MG PO TABS
650.0000 mg | ORAL_TABLET | Freq: Four times a day (QID) | ORAL | Status: DC | PRN
  Administered 2014-12-18 – 2014-12-19 (×3): 650 mg via ORAL
  Filled 2014-12-11 (×3): qty 2

## 2014-12-11 MED ORDER — INSULIN ASPART 100 UNIT/ML ~~LOC~~ SOLN
0.0000 [IU] | Freq: Three times a day (TID) | SUBCUTANEOUS | Status: DC
  Administered 2014-12-12 (×3): 2 [IU] via SUBCUTANEOUS
  Administered 2014-12-13: 1 [IU] via SUBCUTANEOUS
  Administered 2014-12-13 (×2): 2 [IU] via SUBCUTANEOUS
  Administered 2014-12-14 (×2): 1 [IU] via SUBCUTANEOUS

## 2014-12-11 MED ORDER — CEFTRIAXONE SODIUM IN DEXTROSE 20 MG/ML IV SOLN
1.0000 g | INTRAVENOUS | Status: DC
  Administered 2014-12-11 – 2014-12-15 (×5): 1 g via INTRAVENOUS
  Filled 2014-12-11 (×7): qty 50

## 2014-12-11 MED ORDER — HEPARIN SODIUM (PORCINE) 5000 UNIT/ML IJ SOLN
5000.0000 [IU] | Freq: Three times a day (TID) | INTRAMUSCULAR | Status: DC
  Administered 2014-12-11 – 2014-12-19 (×23): 5000 [IU] via SUBCUTANEOUS
  Filled 2014-12-11 (×31): qty 1

## 2014-12-11 MED ORDER — DEXTROSE 5 % IV SOLN
250.0000 mg | INTRAVENOUS | Status: DC
  Administered 2014-12-11 – 2014-12-12 (×2): 250 mg via INTRAVENOUS
  Filled 2014-12-11 (×4): qty 250

## 2014-12-11 MED ORDER — ALPRAZOLAM 0.5 MG PO TABS
0.5000 mg | ORAL_TABLET | Freq: Three times a day (TID) | ORAL | Status: DC
  Administered 2014-12-11 – 2014-12-24 (×37): 0.5 mg via ORAL
  Filled 2014-12-11 (×38): qty 1

## 2014-12-11 NOTE — H&P (Signed)
Name: Jason Ballard MRN: [id number] DOB: 1966-05-01    LOS: 0  PCCM ADMISSION NOTE  History of Present Illness:  Pt is a 49 y/o male w/ PMHx of COPD and asthma who presents from [facility]  for management of rt chest tube and possible VADS. He was admitted to [facility] on 4/10 for PNA and tx with azithromycin and rocephin for RML CAP found to be a cavitating PNA by CT chest and developed a pneumothorax after which a chest tube was placed. Pt was placed on BIPAP while admitted which caused a chest tube leak and subsequent subcutaneous emphysema. Pt had a fever of 103.0 deg F on presentation to [facility]. Sputum culture obtained there was negative.   Lines / Drains: Rt chest tube 4/11>> Lt PIV 4/13>>  Cultures: Blood cultures x 2 4/11, from OSH >>NGTD (verified by phone on 4/13) Sputum culture 4/11, from OSH>> negative  Antibiotics: Rocephin 4/11>>> Azithromycin 4/11>>>  Tests / Events: CT chest 4/12>> cavitating PNA rt upper lobe, largest cavity 18mm, small parapneumonic effusion, rt chest tube w/ trace residual pneumothorax PCXR 4/13>> stable chest tube w/ progressive subcutaneous emphysema on the rt, no pneumothorax.    Past Medical History  Diagnosis Date  . COPD (chronic obstructive pulmonary disease)   . Asthma   . Emphysema    Past Surgical History  Procedure Laterality Date  . Back surgery  2008    neck surgery  . Nerve replacement in right elbow, 2008    . Appendectomy    . Anterior cervical decomp/discectomy fusion N/A 05/03/2013    Procedure: ANTERIOR CERVICAL DECOMPRESSION/DISCECTOMY FUSION Cervical Three-Four;  Surgeon: Jason Ghee, MD;  Location: [facility];  Service: Neurosurgery;  Laterality: N/A;   Prior to Admission medications   Medication Sig Start Date End Date Taking? Authorizing Provider  Aclidinium Bromide (TUDORZA PRESSAIR) 400 MCG/ACT AEPB Inhale 2 puffs into the lungs 2 (two) times daily.    Historical Provider, MD   albuterol (PROVENTIL HFA;VENTOLIN HFA) 108 (90 BASE) MCG/ACT inhaler Inhale 2 puffs into the lungs every 6 (six) hours as needed for wheezing.    Historical Provider, MD  albuterol (PROVENTIL) (2.5 MG/3ML) 0.083% nebulizer solution Take 2.5 mg by nebulization every 6 (six) hours as needed for wheezing.    Historical Provider, MD  ALPRAZolam Jason Ballard) 0.5 MG tablet Take 0.5 mg by mouth 3 (three) times daily.    Historical Provider, MD  HYDROmorphone (DILAUDID) 4 MG tablet Take 1 tablet (4 mg total) by mouth every 4 (four) hours as needed for pain. 05/04/13   Jason Chimera, MD  ipratropium (ATROVENT) 0.02 % nebulizer solution Take 500 mcg by nebulization every 6 (six) hours as needed for wheezing.    Historical Provider, MD  mometasone-formoterol (DULERA) 100-5 MCG/ACT AERO Inhale 2 puffs into the lungs 2 (two) times daily.    Historical Provider, MD  roflumilast (DALIRESP) 500 MCG TABS tablet Take 500 mcg by mouth daily.    Historical Provider, MD   Allergies No Known Allergies  Family History No family history on file.  Social History  reports that he quit smoking about 2 years ago. His smoking use included Cigarettes. He has a 33 pack-year smoking history. He has never used smokeless tobacco. He reports that he drinks alcohol. He reports that he does not use illicit drugs.  Review Of Systems  11 points review of systems is negative with an exception of listed in HPI.  Vital Signs: Temp:  [98.5 F (36.9 C)]  98.5 F (36.9 C) (04/13 1817) Pulse Rate:  [85] 85 (04/13 1900) Resp:  [15] 15 (04/13 1900) BP: (120-125)/(69-75) 125/75 mmHg (04/13 1900) SpO2:  [97 %-100 %] 97 % (04/13 1900) FiO2 (%):  [35 %] 35 % (04/13 1817)    Physical Examination: General:  NAD Neuro:  AAOx3, moving all 4 extremities HEENT:  Moist mucous membranes, 2L O2 [place] Neck:  Neg for crepitus and neck swelling Chest Wall: rt chest wall crepitus and tenderness to palpation, rt chest tube in place, dressing dry and  intact Cardiovascular:  RRR, no murmurs Lungs:  Coarse crackles and wheezing b/l Abdomen:  Active bowel sounds, non tender, soft Musculoskeletal:  Neg for pedal edema Skin:  Dry, warm, and intact   Labs and Imaging:  Reviewed.  Please refer to the Assessment and Plan section for relevant results.  Assessment and Plan:  PULMONARY A: Acute on chronic respiratory failure- due to PNA on COPD, not intubated Cavitating PNA- seen on CT chest at OSH, CT sx following Pneumothorax- resolved w/ rt chest tube per CXR on admission COPD- on high dose steroids, scheduled nebs  P: [place] to maintain sats >92% abx for CAP and solumedrol 40mg  q6h Resumed home dulera and daliresp Would avoid BIPAP to avoid spread of subcutaneous emphysema  Robitussin for cough   CARDIOVASCULAR A:  Hx of HTN, not on any home meds P:  Will continue to monitor  GI A: no active issues P:  Carb mod diet PPI while on steroids   ENDOCRINE A:  On high dose steroids No hx of diabetes ?adrenal insufficiency P: SSI sensitive Solumedrol 40mg  q6h  INFECTIOUS A: Abx: Rocephin 4/11>> Azithromycin 4/11 >> P: Continue abx for CAP CXR ordered for the morning  Hematology A: Leukocytosis 2/2 CAP vs steroid use P: CBC in the morning Cont abx   NEURO A:  Mild confusion, AAOx3 Chronic benzo use, at risk for benzo w/d P: Resumed home xanax TID Caution for steroid psychosis    Best practices / Disposition: -->ICU status under PCCM -->full code -->Heparin for DVT Px -->wife updated at bedside 4/13  In summary Jason Ballard is a 49 y/o male w/ PMHx of COPD and asthma who presents for chest tube management and VADs  from cavitating PNA complicated by pneumothorax and subcutaneous emphysema. Pneumothorax resolved per CXR 4/13. Pt is hemodynamically stable, CT sx following w/ no indication for sx at this time. Will continue abx and steroids.     Jason Ballard 12/11/2014, 8:04 PM   PCCM ATTENDING: I  have reviewed pt's initial presentation, consultants notes and hospital database in detail.  The above assessment and plan was formulated under my direction.  In summary: Hx above has been confirmed by me i have examined pt and confirmed the above findings The principal diagnosis is RUL necrotizing PNA complicated by PTX.  He has been seen in consultation by Dr Cyndia Bent of [facility] and there is no indication for surgical intervention presently Will treat with abx-he will need a prolonged course for necrotizing PNA Chest tube is well positioned and appears to be functional Dr Vivi Martens input is appreciated Will also treat COPD with BDs I have counseled re need for smoking cessation He has chronic pain syndrome - continue PRN analgesics  Merton Border, MD;  PCCM service; Mobile [phone]

## 2014-12-11 NOTE — Progress Notes (Signed)
Patient arrived to ICU on Bipap.  Unable to obtain good seal to place patient on Bipap.  Noticed that patient was not in distress and placed on 3L nasal cannula.  Sats currently 97%.  Will continue to monitor.

## 2014-12-11 NOTE — Consult Note (Signed)
[address]       [place] [address]             [phone]      Cardiothoracic Surgery Consultation   Reason for Consult: Right lung necrotizing pneumonia with spontaneous pneumothorax Referring Physician: Dr. Norva Karvonen Gravlin is an 49 y.o. male.  HPI:   The patient is a 49 year old smoker with a history of COPD on disability and chronic pain medicine dependence who was admitted to [facility] on 12/09/2014 with a few day history of cough with brownish sputum, hemoptysis, fever and feeling poorly. He was admitted with a diagnosis of pneumonia and COPD flare and treated with antibiotics. He reportedly had a fever of 103 and a leukocytosis of 25 K on admission. He was treated with Bipap for hypoxemic respiratory failure and developed a spontaneous right pneumothorax treated with a right chest tube by general surgery two days ago. He had a persistent air leak and I was called by the hospitalist physician at [facility] today who requested transfer. His wife is here with him.  Past Medical History  Diagnosis Date  . COPD (chronic obstructive pulmonary disease)   . Asthma   . Emphysema     Past Surgical History  Procedure Laterality Date  . Back surgery  2008    neck surgery  . Nerve replacement in right elbow, 2008    . Appendectomy    . Anterior cervical decomp/discectomy fusion N/A 05/03/2013    Procedure: ANTERIOR CERVICAL DECOMPRESSION/DISCECTOMY FUSION Cervical Three-Four;  Surgeon: Faythe Ghee, MD;  Location: [facility];  Service: Neurosurgery;  Laterality: N/A;    No family history on file.  Social History:  reports that he quit smoking about 2 years ago. His smoking use included Cigarettes. He has a 33 pack-year smoking history. He has never used smokeless tobacco. He reports that he drinks alcohol. He reports that he does not use illicit drugs.  Allergies: No Known Allergies  Medications:  I have reviewed the patient's current  medications. Prior to Admission:  Prescriptions prior to admission  Medication Sig Dispense Refill Last Dose  . Aclidinium Bromide (TUDORZA PRESSAIR) 400 MCG/ACT AEPB Inhale 2 puffs into the lungs 2 (two) times daily.   05/03/2013 at 0500  . albuterol (PROVENTIL HFA;VENTOLIN HFA) 108 (90 BASE) MCG/ACT inhaler Inhale 2 puffs into the lungs every 6 (six) hours as needed for wheezing.   05/03/2013 at 0500  . albuterol (PROVENTIL) (2.5 MG/3ML) 0.083% nebulizer solution Take 2.5 mg by nebulization every 6 (six) hours as needed for wheezing.   05/02/2013 at Unknown  . ALPRAZolam (XANAX) 0.5 MG tablet Take 0.5 mg by mouth 3 (three) times daily.   05/02/2013 at Unknown  . HYDROmorphone (DILAUDID) 4 MG tablet Take 1 tablet (4 mg total) by mouth every 4 (four) hours as needed for pain. 45 tablet 0   . ipratropium (ATROVENT) 0.02 % nebulizer solution Take 500 mcg by nebulization every 6 (six) hours as needed for wheezing.   05/03/2013 at 0500  . mometasone-formoterol (DULERA) 100-5 MCG/ACT AERO Inhale 2 puffs into the lungs 2 (two) times daily.   05/03/2013 at 0500  . roflumilast (DALIRESP) 500 MCG TABS tablet Take 500 mcg by mouth daily.   05/03/2013 at 0500   Scheduled: . ALPRAZolam  0.5 mg Oral TID  . azithromycin  250 mg Intravenous Q24H  . cefTRIAXone (ROCEPHIN)  IV  1 g Intravenous Q24H  . heparin  5,000 Units Subcutaneous 3 times per day  . insulin aspart  0-5 Units Subcutaneous QHS  . [START ON 12/12/2014] insulin aspart  0-9 Units Subcutaneous TID WC  . ipratropium-albuterol  3 mL Nebulization Q4H  . [START ON 12/12/2014] methylPREDNISolone (SOLU-MEDROL) injection  40 mg Intravenous Q6H  . mometasone-formoterol  2 puff Inhalation BID  . pantoprazole  40 mg Oral Daily  . roflumilast  500 mcg Oral Daily   Continuous:  [id number] **OR** acetaminophen, albuterol, guaifenesin, HYDROmorphone (DILAUDID) injection Anti-infectives    Start     Dose/Rate Route Frequency Ordered Stop   12/11/14 2100   azithromycin (ZITHROMAX) 250 mg in dextrose 5 % 125 mL IVPB     250 mg 125 mL/hr over 60 Minutes Intravenous Every 24 hours 12/11/14 2024     12/11/14 2045  cefTRIAXone (ROCEPHIN) 1 g in dextrose 5 % 50 mL IVPB - Premix     1 g 100 mL/hr over 30 Minutes Intravenous Every 24 hours 12/11/14 2031         Results for orders placed or performed during the hospital encounter of 12/11/14 (from the past 48 hour(s))  Glucose, capillary     Status: Abnormal   Collection Time: 12/11/14  8:09 PM  Result Value Ref Range   Glucose-Capillary 131 (H) 70 - 99 mg/dL   Comment 1 Venous Specimen     Dg Chest Port 1 View  12/11/2014   CLINICAL DATA:  Pneumonia.  COPD.  EXAM: PORTABLE CHEST - 1 VIEW  COMPARISON:  12/11/2014.  FINDINGS: The right-sided chest tube is stable. Interval worsening of subcutaneous emphysema suggesting an air leak. No obvious right-sided pneumothorax. Persistent right mid lung infiltrate. The left lung is clear.  IMPRESSION: Stable chest tube but progressive subcutaneous emphysema on the right side. No obvious pneumothorax.   Electronically Signed   By: Marijo Sanes M.D.   On: 12/11/2014 19:30    Review of Systems  Constitutional: Positive for fever and malaise/fatigue. Negative for chills and weight loss.  HENT:       Has had problems with his teeth for years.   Eyes: Negative.   Respiratory: Positive for cough, hemoptysis, sputum production, shortness of breath and wheezing.   Cardiovascular: Negative for chest pain.  Gastrointestinal: Negative.   Genitourinary: Negative.   Musculoskeletal: Positive for back pain and neck pain.  Skin: Negative.   Neurological: Negative.   Endo/Heme/Allergies: Negative.   Psychiatric/Behavioral: Positive for substance abuse.       Wife has noticed confusion since this started.   Blood pressure 130/57, pulse 93, temperature 98.5 F (36.9 C), temperature source Oral, resp. rate 21, SpO2 97 %. Physical Exam  Constitutional: He is oriented  to person, place, and time.  Looks ill   HENT:  Head: Normocephalic and atraumatic.  Poor dentition  Eyes: EOM are normal.  Neck: Normal range of motion. Neck supple. No thyromegaly present.  Cardiovascular: Normal rate, regular rhythm, normal heart sounds and intact distal pulses.   No murmur heard. Respiratory: He has wheezes.  Decreased breath sounds on the right.  Subcutaneous emphysema over the right anterior chest and neck, reportedly worse than earlier today.  GI: Soft. Bowel sounds are normal. He exhibits no distension and no mass. There is no tenderness.  Musculoskeletal: He exhibits no edema or tenderness.  Lymphadenopathy:    He has no cervical adenopathy.  Neurological: He is alert and oriented to person, place, and time. He has normal strength. No cranial nerve deficit.  Skin:  Skin is warm and dry.  Psychiatric:  Mild delirium   CLINICAL DATA: Pneumonia. COPD.  EXAM: PORTABLE CHEST - 1 VIEW  COMPARISON: 12/11/2014.  FINDINGS: The right-sided chest tube is stable. Interval worsening of subcutaneous emphysema suggesting an air leak. No obvious right-sided pneumothorax. Persistent right mid lung infiltrate. The left lung is clear.  IMPRESSION: Stable chest tube but progressive subcutaneous emphysema on the right side. No obvious pneumothorax.   Electronically Signed  By: Marijo Sanes M.D.  On: 12/11/2014 19:30   Assessment/Plan:  I have personally reviewed his CT scan of the chest done today at [facility]. There is a necrotizing pneumonia involving the posterior aspect of the right upper lobe with cavity formation with air fluid levels, the largest measuring 2.4 cm. This resulted in a pneumothorax. The chest tube is in good position with minimal residual ptx. There is a small parapneumonic effusion at the base that does not appear loculated. There is a small continuous air leak from the chest tube. There is subcutaneous emphysema that is  probably due to the small continuous air leak with the tube possibly getting kinked during transport and not being on suction. I don't think there is any indication for surgical treatment at this time. I would keep the chest tube to suction and treat his pneumonia with appropriate antibiotics and pulmonary toilet. It is certainly possible that he may develop an increasing right pleural effusion and empyema that could require surgical drainage but I don't think surgical drainage is indicated at this time. I reviewed the CT scan findings and plans with the patient and his wife and answered their questions.  Gaye Pollack 12/11/2014, 9:38 PM

## 2014-12-12 ENCOUNTER — Inpatient Hospital Stay (HOSPITAL_COMMUNITY): Payer: Medicare Other

## 2014-12-12 DIAGNOSIS — J9311 Primary spontaneous pneumothorax: Secondary | ICD-10-CM

## 2014-12-12 LAB — GLUCOSE, CAPILLARY
GLUCOSE-CAPILLARY: 156 mg/dL — AB (ref 70–99)
GLUCOSE-CAPILLARY: 200 mg/dL — AB (ref 70–99)
Glucose-Capillary: 133 mg/dL — ABNORMAL HIGH (ref 70–99)
Glucose-Capillary: 170 mg/dL — ABNORMAL HIGH (ref 70–99)

## 2014-12-12 LAB — BASIC METABOLIC PANEL
Anion gap: 10 (ref 5–15)
BUN: 13 mg/dL (ref 6–23)
CO2: 29 mmol/L (ref 19–32)
Calcium: 8.4 mg/dL (ref 8.4–10.5)
Chloride: 100 mmol/L (ref 96–112)
Creatinine, Ser: 0.65 mg/dL (ref 0.50–1.35)
GFR calc non Af Amer: >90 mL/min (ref >90)
GLUCOSE: 150 mg/dL — AB (ref 70–99)
POTASSIUM: 4 mmol/L (ref 3.5–5.1)
Sodium: 139 mmol/L (ref 135–145)

## 2014-12-12 LAB — CBC
HEMATOCRIT: 37.9 % — AB (ref 39.0–52.0)
HEMOGLOBIN: 12.6 g/dL — AB (ref 13.0–17.0)
MCH: 30.1 pg (ref 26.0–34.0)
MCHC: 33.2 g/dL (ref 30.0–36.0)
MCV: 90.7 fL (ref 78.0–100.0)
PLATELETS: 310 10*3/uL (ref 150–400)
RBC: 4.18 MIL/uL — ABNORMAL LOW (ref 4.22–5.81)
RDW: 13 % (ref 11.5–15.5)
WBC: 21.2 10*3/uL — ABNORMAL HIGH (ref 4.0–10.5)

## 2014-12-12 MED ORDER — METHYLPREDNISOLONE SODIUM SUCC 40 MG IJ SOLR
40.0000 mg | Freq: Two times a day (BID) | INTRAMUSCULAR | Status: AC
  Administered 2014-12-12 – 2014-12-13 (×4): 40 mg via INTRAVENOUS
  Filled 2014-12-12 (×4): qty 1

## 2014-12-12 MED ORDER — OXYCODONE-ACETAMINOPHEN 5-325 MG PO TABS
2.0000 | ORAL_TABLET | ORAL | Status: DC | PRN
Start: 2014-12-12 — End: 2014-12-16
  Administered 2014-12-12 – 2014-12-16 (×22): 2 via ORAL
  Filled 2014-12-12 (×22): qty 2

## 2014-12-12 MED ORDER — HYDROMORPHONE HCL 1 MG/ML IJ SOLN
0.5000 mg | INTRAMUSCULAR | Status: DC | PRN
  Administered 2014-12-12 – 2014-12-16 (×36): 1 mg via INTRAVENOUS
  Filled 2014-12-12 (×38): qty 1

## 2014-12-12 MED ORDER — BUDESONIDE 0.25 MG/2ML IN SUSP
0.2500 mg | Freq: Four times a day (QID) | RESPIRATORY_TRACT | Status: DC
  Administered 2014-12-12 – 2014-12-15 (×13): 0.25 mg via RESPIRATORY_TRACT
  Filled 2014-12-12 (×19): qty 2

## 2014-12-12 MED ORDER — IPRATROPIUM-ALBUTEROL 0.5-2.5 (3) MG/3ML IN SOLN
3.0000 mL | Freq: Four times a day (QID) | RESPIRATORY_TRACT | Status: DC
  Administered 2014-12-12 – 2014-12-15 (×13): 3 mL via RESPIRATORY_TRACT
  Filled 2014-12-12 (×15): qty 3

## 2014-12-12 NOTE — Progress Notes (Signed)
UR Completed.  [phone]  

## 2014-12-12 NOTE — Evaluation (Signed)
Physical Therapy Evaluation Patient Details Name: Jason Ballard MRN: [id number] DOB: 1966-02-14 Today's Date: 12/12/2014   History of Present Illness  Jason Ballard is a 49 y/o male w/ PMHx of COPD, emphysema, and asthma who was admitted for chest tube management and VADs from cavitating PNA complicated by pneumothorax.  Clinical Impression  Pt admitted with the above complications. Pt currently with functional limitations due to the deficits listed below (see PT Problem List). Ambulating slowly with min guard assist for safety. SpO2 87% on room air, 92% with cues for pursed lip breathing. Has strong family support from wife who will be available 24/7 at discharge. Pt with some confusion (unsure of month or city in which he lives until cued,) wife reports she feels this is improving since admission. Pt will benefit from skilled PT to increase their independence and safety with mobility to allow discharge to the venue listed below.       Follow Up Recommendations Home health PT (level of assistance pending resolution of confusion)    Equipment Recommendations  Rolling walker with 5" wheels    Recommendations for Other Services OT consult     Precautions / Restrictions Precautions Precautions: Other (comment) (Chest tube) Precaution Comments: Chest tube management Restrictions Weight Bearing Restrictions: No      Mobility  Bed Mobility Overal bed mobility: Needs Assistance Bed Mobility: Supine to Sit     Supine to sit: HOB elevated;Min guard     General bed mobility comments: Min guard for safety and to manage chest tube. Cues for technique. Did not require physical assist but did require extra time.  Transfers Overall transfer level: Needs assistance Equipment used: Rolling walker (2 wheeled) Transfers: Sit to/from Stand Sit to Stand: Min guard         General transfer comment: Min guard for safety from lowest bed setting and BSC. Cues to place hands on RW for  stability upon standing. Denies dizziness but states he needs a moment to adjust prior to attempting ambulating.  Ambulation/Gait Ambulation/Gait assistance: Min guard;+2 safety/equipment Ambulation Distance (Feet): 165 Feet Assistive device: Rolling walker (2 wheeled) Gait Pattern/deviations: Step-through pattern;Decreased stride length;Trunk flexed Gait velocity: decreased Gait velocity interpretation: Below normal speed for age/gender General Gait Details: Demonstrates good control of RW in open and narrow spaces. Min guard for safety with +2 for equipment. VC for upright posture with forward gaze intermittently. SpO2 lowest at 87% on room air, but flucuating to 92% with cues for pursed lip breathing throughout ambulatory bout. HR low 100s. Minimal dyspnea noted and did not requre physical assist for balance.  Stairs            Wheelchair Mobility    Modified Rankin (Stroke Patients Only)       Balance Overall balance assessment: Needs assistance Sitting-balance support: No upper extremity supported;Feet supported Sitting balance-Leahy Scale: Good     Standing balance support: No upper extremity supported Standing balance-Leahy Scale: Fair Standing balance comment: Stood briefly on 2 occasions without holding RW for support.                             Pertinent Vitals/Pain Pain Assessment: 0-10 Pain Score: 3  (0 at start of therapy ) Pain Location: Rt chest tube site Pain Intervention(s): Monitored during session;Repositioned;RN gave pain meds during session  Start of therapy: HR 78, SpO2 96% on 2L [place], BP 131/72 During therapy: SpO2 87-92% on room air, HR <110, SBP 141  after sitting    Home Living Family/patient expects to be discharged to:: Private residence Living Arrangements: Spouse/significant other Available Help at Discharge: Family;Available 24 hours/day Type of Home: Mobile home Home Access: Stairs to enter Entrance Stairs-Rails:  Left Entrance Stairs-Number of Steps: 3-4 Home Layout: One level Home Equipment: Cane - single point      Prior Function Level of Independence: Independent         Comments: Rides motorcycles     Hand Dominance        Extremity/Trunk Assessment   Upper Extremity Assessment: Defer to OT evaluation           Lower Extremity Assessment: Overall WFL for tasks assessed         Communication   Communication: No difficulties  Cognition Arousal/Alertness: Awake/alert Behavior During Therapy: WFL for tasks assessed/performed Overall Cognitive Status: Impaired/Different from baseline Area of Impairment: Orientation;Memory;Following commands;Problem solving Orientation Level: Disoriented to;Time (Unsure of month, forgetful of where he lives Therapist, art))   Memory: Decreased short-term memory Following Commands: Follows one step commands consistently;Follows multi-step commands with increased time     Problem Solving: Slow processing;Requires verbal cues General Comments: Wife states his confusion is improving slowly since admission. Oriented to year, place, and situation. Unsure of month, where he lives (stated Jason Ballard and Jason Ballard - wife reports Jason Ballard.)     General Comments General comments (skin integrity, edema, etc.): Pt had a BM in BSC. Wife assisted with hygiene while PT guarded for safety and managed lines/leads. Notable suction noise coming from chest tube, RN notified and reports team is aware.    Exercises General Exercises - Lower Extremity Ankle Circles/Pumps: AROM;Both;10 reps;Seated Long Arc Quad: Strengthening;Both;5 reps;Seated Hip Flexion/Marching: Strengthening;Both;5 reps;Seated      Assessment/Plan    PT Assessment Patient needs continued PT services  PT Diagnosis Difficulty walking;Abnormality of gait;Acute pain   PT Problem List Decreased activity tolerance;Decreased balance;Decreased mobility;Decreased cognition;Decreased knowledge of  use of DME;Cardiopulmonary status limiting activity;Pain  PT Treatment Interventions DME instruction;Gait training;Stair training;Functional mobility training;Therapeutic activities;Therapeutic exercise;Balance training;Neuromuscular re-education;Cognitive remediation;Patient/family education;Modalities   PT Goals (Current goals can be found in the Care Plan section) Acute Rehab PT Goals Patient Stated Goal: Ride his motorcycle PT Goal Formulation: With patient/family Time For Goal Achievement: 12/26/14 Potential to Achieve Goals: Good    Frequency Min 3X/week   Barriers to discharge        Co-evaluation               End of Session Equipment Utilized During Treatment: Oxygen (Applied 2L at end of therapy, pt couging sats 87%) Activity Tolerance: Patient tolerated treatment well Patient left: in chair;with call bell/phone within reach;with family/visitor present Nurse Communication: Mobility status;Other (comment) (Audible suction from chest tube)         Time: 1223-1258 PT Time Calculation (min) (ACUTE ONLY): 35 min   Charges:   PT Evaluation $Initial PT Evaluation Tier I: 1 Procedure PT Treatments $Gait Training: 8-22 mins   PT G Codes:        Ellouise Newer 12/12/2014, 1:48 PM Elayne Snare, [place]

## 2014-12-12 NOTE — Progress Notes (Signed)
Notified E-link Re: continued right side chest tube pain.  New orders received at this time.  Will continue to monitor.

## 2014-12-12 NOTE — Progress Notes (Signed)
Chaplain responded to page from 2S to minister to pt's wife who was tearful. I visited with her in 2S waiting area while she waited for pt to be situated in his room. Pt had just arrived from [place] provided empathic listening and ministry of presence.

## 2014-12-12 NOTE — Progress Notes (Signed)
[place] Progress Note Patient Name: Kalim Kissel DOB: 26-Feb-1966 MRN: [id number]   Date of Service  12/12/2014  HPI/Events of Note  Patient c/o pain.  eICU Interventions  Increase Dilaudid dose frequency to Q 1 hour PRN.     Intervention Category Minor Interventions: Routine modifications to care plan (e.g. PRN medications for pain, fever);Communication with other healthcare providers and/or family  Lysle Dingwall 12/12/2014, 5:37 PM

## 2014-12-12 NOTE — Progress Notes (Signed)
Name: Jason Ballard MRN: [id number] DOB: [birth year]    LOS: 1  PCCM ADMISSION NOTE  History of Present Illness:  Pt is a 49 y/o male w/ PMHx of COPD and asthma who presents from [facility]  for management of rt chest tube and possible VADS. He was admitted to [facility] on 4/10 for PNA and tx with azithromycin and rocephin for RML CAP found to be a cavitating PNA by CT chest and developed a pneumothorax after which a chest tube was placed. Pt was placed on BIPAP while admitted which caused a chest tube leak and subsequent subcutaneous emphysema. Pt had a fever of 103.0 deg F on presentation to [facility]. Sputum culture obtained there was negative.   Lines / Drains: Rt chest tube 4/11>> Lt PIV 4/13>>  Cultures: Blood cultures x 2 4/11, from OSH >>NGTD (verified by phone on 4/13) Sputum culture 4/11, from OSH>> negative  Antibiotics: Rocephin 4/11>>> Azithromycin 4/11>>>  Tests / Events: CT chest 4/12>> cavitating PNA rt upper lobe, largest cavity 87mm, small parapneumonic effusion, rt chest tube w/ trace residual pneumothorax PCXR 4/13>> stable chest tube w/ progressive subcutaneous emphysema on the rt, no pneumothorax.    Past Medical History  Diagnosis Date  . COPD (chronic obstructive pulmonary disease)   . Asthma   . Emphysema    Past Surgical History  Procedure Laterality Date  . Back surgery  2008    neck surgery  . Nerve replacement in right elbow, 2008    . Appendectomy    . Anterior cervical decomp/discectomy fusion N/A 05/03/2013    Procedure: ANTERIOR CERVICAL DECOMPRESSION/DISCECTOMY FUSION Cervical Three-Four;  Surgeon: Faythe Ghee, MD;  Location: [place] [facility];  Service: Neurosurgery;  Laterality: N/A;   Prior to Admission medications   Medication Sig Start Date End Date Taking? Authorizing Provider  Aclidinium Bromide (TUDORZA PRESSAIR) 400 MCG/ACT AEPB Inhale 2 puffs into the lungs 2 (two) times daily.    Historical Provider, MD   albuterol (PROVENTIL HFA;VENTOLIN HFA) 108 (90 BASE) MCG/ACT inhaler Inhale 2 puffs into the lungs every 6 (six) hours as needed for wheezing.    Historical Provider, MD  albuterol (PROVENTIL) (2.5 MG/3ML) 0.083% nebulizer solution Take 2.5 mg by nebulization every 6 (six) hours as needed for wheezing.    Historical Provider, MD  ALPRAZolam Duanne Moron) 0.5 MG tablet Take 0.5 mg by mouth 3 (three) times daily.    Historical Provider, MD  HYDROmorphone (DILAUDID) 4 MG tablet Take 1 tablet (4 mg total) by mouth every 4 (four) hours as needed for pain. 05/04/13   Karie Chimera, MD  ipratropium (ATROVENT) 0.02 % nebulizer solution Take 500 mcg by nebulization every 6 (six) hours as needed for wheezing.    Historical Provider, MD  mometasone-formoterol (DULERA) 100-5 MCG/ACT AERO Inhale 2 puffs into the lungs 2 (two) times daily.    Historical Provider, MD  roflumilast (DALIRESP) 500 MCG TABS tablet Take 500 mcg by mouth daily.    Historical Provider, MD   Allergies No Known Allergies  Family History No family history on file.  Social History  reports that he quit smoking about 2 years ago. His smoking use included Cigarettes. He has a 33 pack-year smoking history. He has never used smokeless tobacco. He reports that he drinks alcohol. He reports that he does not use illicit drugs.  Review Of Systems  11 points review of systems is negative with an exception of listed in HPI.  Vital Signs: Temp:  [98 F (36.7 C)-98.9  F (37.2 C)] 98.9 F (37.2 C) (04/14 0812) Pulse Rate:  [78-112] 78 (04/14 0900) Resp:  [14-25] 23 (04/14 0900) BP: (102-138)/(55-92) 132/92 mmHg (04/14 0900) SpO2:  [90 %-100 %] 97 % (04/14 0900) FiO2 (%):  [35 %] 35 % (04/13 1817) Weight:  [80.7 kg (177 lb 14.6 oz)] 80.7 kg (177 lb 14.6 oz) (04/14 0400) I/O last 3 completed shifts: In: 175 [IV Piggyback:175] Out: 825 [Urine:675; Chest Tube:150]  Physical Examination: General:  NAD Neuro:  AAOx3, moving all 4  extremities HEENT:  Moist mucous membranes, 2L O2 [place] Neck:  Neg for crepitus and neck swelling Chest Wall: rt chest wall crepitus and tenderness to palpation, rt chest tube in place, dressing dry and intact Cardiovascular:  RRR, no murmurs Lungs:  Coarse crackles and wheezing b/l Abdomen:  Active bowel sounds, non tender, soft Musculoskeletal:  Neg for pedal edema Skin:  Dry, warm, and intact   Labs and Imaging:  Reviewed.  Please refer to the Assessment and Plan section for relevant results.  Assessment and Plan:  PULMONARY A: Acute on chronic respiratory failure- due to PNA on COPD, not intubated, stable, d/c BiPAP given PTX. Cavitating PNA- seen on CT chest at OSH, CT sx following Pneumothorax- worsening, continue CT to suction COPD- on high dose steroids, scheduled nebs P: [place] to maintain sats >92% Abx for CAP and solumedrol 40mg  q6h Resumed home dulera and daliresp D/C BiPAP No surgical interventions per CVTS Robitussin for cough  CARDIOVASCULAR A:  Worsening hypertension P:  Add beta blockers  GI A: no active issues P:  Regular diet. PPI while on steroids  ENDOCRINE A:  On high dose steroids No hx of diabetes ?adrenal insufficiency P: SSI sensitive Decrease Solumedrol 40mg  q12h  INFECTIOUS A: Abx: Rocephin 4/11>> Azithromycin 4/11 >> P: Continue abx for CAP CXR ordered for the morning  Hematology A: Leukocytosis 2/2 CAP vs steroid use P: CBC in the morning Cont abx  NEURO A:  Mild confusion, AAOx3 Chronic benzo use, at risk for benzo w/d P: Resumed home xanax TID Caution for steroid psychosis   Transfer to SDU and to [place], CVTS to manage chest tube, PCCM will sign off, please call back if needed.  Rush Farmer, M.D. [facility] Pulmonary/Critical Care Medicine. Pager: [phone]. After hours pager: [phone].  12/12/2014, 10:24 AM

## 2014-12-12 NOTE — Progress Notes (Signed)
  Subjective:  Complains of right chest pain today  Objective: Vital signs in last 24 hours: Temp:  [98 F (36.7 C)-98.9 F (37.2 C)] 98.3 F (36.8 C) (04/14 1638) Pulse Rate:  [69-112] 82 (04/14 1700) Cardiac Rhythm:  [-] Normal sinus rhythm (04/14 0800) Resp:  [14-25] 16 (04/14 1700) BP: (102-138)/(50-92) 119/50 mmHg (04/14 1700) SpO2:  [90 %-100 %] 97 % (04/14 1700) FiO2 (%):  [35 %] 35 % (04/13 1817) Weight:  [80.7 kg (177 lb 14.6 oz)] 80.7 kg (177 lb 14.6 oz) (04/14 0400)  Hemodynamic parameters for last 24 hours:    Intake/Output from previous day: 04/13 0701 - 04/14 0700 In: 175 [IV Piggyback:175] Out: 825 [Urine:675; Chest Tube:150] Intake/Output this shift: Total I/O In: 960 [P.O.:960] Out: 100 [Chest Tube:100]  General appearance: alert, cooperative and looks uncomfortable Heart: regular rate and rhythm, S1, S2 normal, no murmur, click, rub or gallop Lungs: diminished breath sounds RLL and RUL Small intermittent air leak from chest tube. Subcutaneous air stable. Lab Results:  Recent Labs  12/11/14 2151 12/12/14 0235  WBC 20.3* 21.2*  HGB 12.7* 12.6*  HCT 37.9* 37.9*  PLT 272 310   BMET:  Recent Labs  12/11/14 2151 12/12/14 0235  NA 139 139  K 4.5 4.0  CL 99 100  CO2 30 29  GLUCOSE 173* 150*  BUN 17 13  CREATININE 0.70 0.65  CALCIUM 8.4 8.4    PT/INR: No results for input(s): LABPROT, INR in the last 72 hours. ABG No results found for: PHART, HCO3, TCO2, ACIDBASEDEF, O2SAT CBG (last 3)   Recent Labs  12/12/14 0839 12/12/14 1157 12/12/14 1634  GLUCAP 156* 133* 200*   CLINICAL DATA: Followup pneumonia.  EXAM: PORTABLE CHEST - 1 VIEW  COMPARISON: 12/11/2014  FINDINGS: Since the previous day's study, a small right pneumothorax has become apparent, estimated at 20%. Right-sided chest tube is stable. Significant right-sided subcutaneous emphysema is also without substantial change.  Airspace consolidation in right upper  lobe extending from the right hilum is similar to the prior exam. Reticular opacities are noted in both lower lungs, increased from the previous day's study, likely atelectasis.  No left pneumothorax. No convincing pleural effusion.  IMPRESSION: 1. Approximately 20% right-sided pneumothorax, new from the previous day's study. 2. Stable right upper lobe consolidation. 3. Mild increase in lung base reticular opacity most likely atelectasis. No other change.   Electronically Signed  By: Lajean Manes M.D.  On: 12/12/2014 08:02  Assessment/Plan:  Necrotizing pneumonia RUL with spontaneous right pneumothorax. The air leak is fairly small. There was a small ptx this am on the CXR that was not apparent yesterday. It is possible that the tube was kinked or got walled off by the lung. It seems to be functioning now. I would continue to suction. He still has a leukocytosis but is on steroids.   LOS: 1 day    Gaye Pollack 12/12/2014

## 2014-12-13 ENCOUNTER — Inpatient Hospital Stay (HOSPITAL_COMMUNITY): Payer: Medicare Other

## 2014-12-13 DIAGNOSIS — T797XXD Traumatic subcutaneous emphysema, subsequent encounter: Secondary | ICD-10-CM

## 2014-12-13 DIAGNOSIS — S270XXD Traumatic pneumothorax, subsequent encounter: Secondary | ICD-10-CM

## 2014-12-13 DIAGNOSIS — J9621 Acute and chronic respiratory failure with hypoxia: Secondary | ICD-10-CM

## 2014-12-13 DIAGNOSIS — J850 Gangrene and necrosis of lung: Principal | ICD-10-CM

## 2014-12-13 LAB — HEMOGLOBIN A1C
Hgb A1c MFr Bld: 5.7 % — ABNORMAL HIGH (ref 4.8–5.6)
Mean Plasma Glucose: 117 mg/dL

## 2014-12-13 LAB — CBC
HCT: 34.2 % — ABNORMAL LOW (ref 39.0–52.0)
HEMOGLOBIN: 11.3 g/dL — AB (ref 13.0–17.0)
MCH: 29.7 pg (ref 26.0–34.0)
MCHC: 33 g/dL (ref 30.0–36.0)
MCV: 90 fL (ref 78.0–100.0)
Platelets: 307 10*3/uL (ref 150–400)
RBC: 3.8 MIL/uL — AB (ref 4.22–5.81)
RDW: 13.1 % (ref 11.5–15.5)
WBC: 14.6 10*3/uL — ABNORMAL HIGH (ref 4.0–10.5)

## 2014-12-13 LAB — MAGNESIUM: MAGNESIUM: 2.5 mg/dL (ref 1.5–2.5)

## 2014-12-13 LAB — BASIC METABOLIC PANEL
ANION GAP: 8 (ref 5–15)
BUN: 15 mg/dL (ref 6–23)
CALCIUM: 7.8 mg/dL — AB (ref 8.4–10.5)
CO2: 31 mmol/L (ref 19–32)
Chloride: 101 mmol/L (ref 96–112)
Creatinine, Ser: 0.51 mg/dL (ref 0.50–1.35)
GFR calc Af Amer: >90 mL/min (ref >90)
GFR calc non Af Amer: >90 mL/min (ref >90)
Glucose, Bld: 146 mg/dL — ABNORMAL HIGH (ref 70–99)
Potassium: 3.6 mmol/L (ref 3.5–5.1)
SODIUM: 140 mmol/L (ref 135–145)

## 2014-12-13 LAB — GLUCOSE, CAPILLARY
GLUCOSE-CAPILLARY: 178 mg/dL — AB (ref 70–99)
Glucose-Capillary: 134 mg/dL — ABNORMAL HIGH (ref 70–99)
Glucose-Capillary: 144 mg/dL — ABNORMAL HIGH (ref 70–99)
Glucose-Capillary: 134 mg/dL — ABNORMAL HIGH (ref 70–99)

## 2014-12-13 LAB — PHOSPHORUS: PHOSPHORUS: 2.4 mg/dL (ref 2.3–4.6)

## 2014-12-13 MED ORDER — AZITHROMYCIN 250 MG PO TABS
250.0000 mg | ORAL_TABLET | Freq: Every day | ORAL | Status: AC
  Administered 2014-12-13 – 2014-12-15 (×3): 250 mg via ORAL
  Filled 2014-12-13 (×3): qty 1

## 2014-12-13 NOTE — Progress Notes (Signed)
  Subjective:  No complaints  Objective: Vital signs in last 24 hours: Temp:  [97.6 F (36.4 C)-98.3 F (36.8 C)] 98.1 F (36.7 C) (04/15 1614) Pulse Rate:  [72-92] 77 (04/15 1600) Cardiac Rhythm:  [-] Normal sinus rhythm (04/15 0845) Resp:  [16-22] 21 (04/15 1600) BP: (114-140)/(50-96) 133/84 mmHg (04/15 1600) SpO2:  [92 %-97 %] 93 % (04/15 1600)  Hemodynamic parameters for last 24 hours:    Intake/Output from previous day: 04/14 0701 - 04/15 0700 In: 1495 [P.O.:1320; IV Piggyback:175] Out: 835 [Urine:600; Chest Tube:235] Intake/Output this shift: Total I/O In: 480 [P.O.:480] Out: 680 [Urine:600; Chest Tube:80]  General appearance: alert and cooperative Heart: regular rate and rhythm, S1, S2 normal, no murmur, click, rub or gallop Lungs: improved breath sounds on the right small intermittent air leak from chest tube with cough  Lab Results:  Recent Labs  12/12/14 0235 12/13/14 0340  WBC 21.2* 14.6*  HGB 12.6* 11.3*  HCT 37.9* 34.2*  PLT 310 307   BMET:  Recent Labs  12/12/14 0235 12/13/14 0340  NA 139 140  K 4.0 3.6  CL 100 101  CO2 29 31  GLUCOSE 150* 146*  BUN 13 15  CREATININE 0.65 0.51  CALCIUM 8.4 7.8*    PT/INR: No results for input(s): LABPROT, INR in the last 72 hours. ABG No results found for: PHART, HCO3, TCO2, ACIDBASEDEF, O2SAT CBG (last 3)   Recent Labs  12/12/14 2203 12/13/14 0806 12/13/14 1154  GLUCAP 170* 134* 144*    Assessment/Plan:  Necrotizing pneumonia with spontaneous ptx. He seems to be improving on antibiotics. No fever and WBC decreasing. Continue chest tube but will decrease suction to 10.  LOS: 2 days    Gaye Pollack 12/13/2014

## 2014-12-13 NOTE — Clinical Social Work Note (Signed)
CSW consulted for advanced directives.  CSW provided pt and pt wife with paper work and explained process.  CSW signing off.  Domenica Reamer, [place] Social Worker [phone]

## 2014-12-13 NOTE — Progress Notes (Signed)
RN received phone call from a male individual that did not want to be named concerning the pt taking medication not provided by the hospital.  The caller stated that the pts wife is giving him outside pills and the caller is concerned about the pills being given to him and the potential outcomes.  The caller wanted to know if we could "check his blood" for the pills.  The caller did not want [place] to discuss the concern with the pt or the individuals in the room and she stated that she has family members that have visited and have seen pills given to him at least two times.  RN advised that there is no way to check for unknown pills being given but we would continue to monitor sedation, levels of consciousness, and pt behaviors.

## 2014-12-13 NOTE — Progress Notes (Signed)
[place] TEAM 1 - Stepdown/ICU TEAM Progress Note  Shae Augello SKA:[id number] DOB: 15-Dec-1965 DOA: 12/11/2014 PCP: Bonnita Nasuti, MD  Admit HPI / Brief Narrative: 49 y/o male w/ Hx of COPD and asthma who presented from [facility] management of rt chest tube and possible VATS. He was admitted to [facility] on 4/10 for RML PNA and tx with azithromycin and rocephin.  CT chest revealed a cavitating PNA and the pt developed a pneumothorax after which a chest tube was placed on 4/11. Pt was placed on BIPAP while at the OSH which caused a chest tube leak and subsequent subcutaneous emphysema. Sputum culture and blood cultures obtained at the OSH were all confimed negative on 4/13.   HPI/Subjective: Patient is presently resting comfortably.  He states that his shortness of breath has improved.  He has some pain the chest tube insertion site.  He denies abdominal pain nausea vomiting or diarrhea at present.  Assessment/Plan:  Acute on chronic respiratory failure due to severe cavitary community-acquired pneumonia Respiratory failure has essentially resolved with patient now requiring only 1 L of nasal cannula oxygen  RUL Cavitary pneumonia Continue empiric antibiotic therapy  Pneumothorax Chest tube management per TCTS  COPD Well compensated at present without active wheeze  Hypertension Reasonably controlled at this time - follow without change in medical therapy today  Chronic benzodiazepine use Continue low dose scheduled benzodiazepine to avoid withdrawal  Code Status: FULL Family Communication: no family present at time of exam Disposition Plan: SDU  Consultants: TCTS  Antibiotics: Rocephin 4/11 > Azithromycin 4/11 >  DVT prophylaxis: Subcutaneous heparin  Objective: Blood pressure 140/76, pulse 81, temperature 98 F (36.7 C), temperature source Oral, resp. rate 16, height 5\' 9"  (1.753 m), weight 80.7 kg (177 lb 14.6 oz), SpO2 95 %.  Intake/Output  Summary (Last 24 hours) at 12/13/14 1539 Last data filed at 12/13/14 1456  Gross per 24 hour  Intake   1015 ml  Output   1415 ml  Net   -400 ml   Exam: General: No acute respiratory distress at rest in bed Lungs: without wheezes or focal crackles - diffuse subcutaneous air auscultated on exam Cardiovascular: Regular rate and rhythm without murmur gallop or rub normal S1 and S2 Abdomen: Nontender, nondistended, soft, bowel sounds positive, no rebound, no ascites, no appreciable mass Extremities: No significant cyanosis, clubbing, or edema bilateral lower extremities  Data Reviewed: Basic Metabolic Panel:  Recent Labs Lab 12/11/14 2151 12/12/14 0235 12/13/14 0340  NA 139 139 140  K 4.5 4.0 3.6  CL 99 100 101  CO2 30 29 31   GLUCOSE 173* 150* 146*  BUN 17 13 15   CREATININE 0.70 0.65 0.51  CALCIUM 8.4 8.4 7.8*  MG  --   --  2.5  PHOS  --   --  2.4    Liver Function Tests:  Recent Labs Lab 12/11/14 2151  AST 24  ALT 19  ALKPHOS 72  BILITOT 0.5  PROT 5.5*  ALBUMIN 2.5*   CBC:  Recent Labs Lab 12/11/14 2151 12/12/14 0235 12/13/14 0340  WBC 20.3* 21.2* 14.6*  NEUTROABS 19.0*  --   --   HGB 12.7* 12.6* 11.3*  HCT 37.9* 37.9* 34.2*  MCV 90.2 90.7 90.0  PLT 272 310 307   CBG:  Recent Labs Lab 12/12/14 1157 12/12/14 1634 12/12/14 2203 12/13/14 0806 12/13/14 1154  GLUCAP 133* 200* 170* 134* 144*    Recent Results (from the past 240 hour(s))  MRSA PCR Screening  Status: None   Collection Time: 12/11/14  9:49 PM  Result Value Ref Range Status   MRSA by PCR NEGATIVE NEGATIVE Final    Comment:        The GeneXpert MRSA Assay (FDA approved for NASAL specimens only), is one component of a comprehensive MRSA colonization surveillance program. It is not intended to diagnose MRSA infection nor to guide or monitor treatment for MRSA infections.      Studies:   Recent x-ray studies have been reviewed in detail by the Attending  Physician  Scheduled Meds:  Scheduled Meds: . ALPRAZolam  0.5 mg Oral TID  . antiseptic oral rinse  7 mL Mouth Rinse BID  . azithromycin  250 mg Oral QHS  . budesonide  0.25 mg Nebulization Q6H  . cefTRIAXone (ROCEPHIN)  IV  1 g Intravenous Q24H  . heparin  5,000 Units Subcutaneous 3 times per day  . insulin aspart  0-5 Units Subcutaneous QHS  . insulin aspart  0-9 Units Subcutaneous TID WC  . ipratropium-albuterol  3 mL Nebulization Q6H  . methylPREDNISolone (SOLU-MEDROL) injection  40 mg Intravenous Q12H  . pantoprazole  40 mg Oral Daily  . roflumilast  500 mcg Oral Daily    Time spent on care of this patient: 35 mins   Primitivo Merkey T , MD   [facility] Office  [phone] Pager - Text Page per Shea Evans as per below:  On-Call/Text Page:      [email or web address]      password [email or web address]  If 7PM-7AM, please contact night-coverage [email or web address] Password [email or web address] 12/13/2014, 3:39 PM   LOS: 2 days

## 2014-12-14 ENCOUNTER — Inpatient Hospital Stay (HOSPITAL_COMMUNITY): Payer: Medicare Other

## 2014-12-14 LAB — GLUCOSE, CAPILLARY
GLUCOSE-CAPILLARY: 137 mg/dL — AB (ref 70–99)
GLUCOSE-CAPILLARY: 122 mg/dL — AB (ref 70–99)
Glucose-Capillary: 147 mg/dL — ABNORMAL HIGH (ref 70–99)
Glucose-Capillary: 121 mg/dL — ABNORMAL HIGH (ref 70–99)

## 2014-12-14 NOTE — Progress Notes (Signed)
   12/14/14 1300  Clinical Encounter Type  Visited With Patient and family together;Health care provider  Visit Type Initial  Referral From Nurse  Advance Directives (For Healthcare)  Does patient have an advance directive? No  Would patient like information on creating an advanced directive? Yes - Educational materials given   Chaplain was paged at 12:12 PM to patient's room. Chaplain was notified that the patient needed some advanced directive assistance. When chaplain arrived patient was being visited by several family members. Chaplain inquired about what the patient needed in regards to an advanced directive. Patient replied that he has completed the healthcare power of attorney portion of the form and wished to get that completed and notarized. Chaplain looked over the patient's advanced directive and it has been filled out correctly. Chaplain let patient know that it is hard to find a notary on the weekend but that he would be happy to send a chaplain to facilitate the completion of document on Monday morning. Patient affirmed that this was ok. Chaplain will notify spiritual care department and ensure that someone follows up with patient on Monday.  Amyri Frenz, Claudius Sis, Chaplain  1:31 PM

## 2014-12-14 NOTE — Progress Notes (Addendum)
       [address]       [place] [address]             [phone]               Subjective: Still having productive cough, sore at CT site.   Objective: Vital signs in last 24 hours: Patient Vitals for the past 24 hrs:  BP Temp Temp src Pulse Resp SpO2 Weight  12/14/14 0853 - - - - - 92 % -  12/14/14 0759 131/72 mmHg 97.6 F (36.4 C) Oral 76 (!) 27 94 % -  12/14/14 0435 110/73 mmHg 97.7 F (36.5 C) Oral 60 16 92 % 175 lb 4.3 oz (79.5 kg)  12/14/14 0255 122/73 mmHg - - 65 (!) 21 95 % -  12/13/14 2344 123/64 mmHg 98.3 F (36.8 C) Oral 68 14 100 % -  12/13/14 2057 131/77 mmHg - - 62 16 93 % -  12/13/14 1952 121/89 mmHg 98.2 F (36.8 C) Oral 74 18 94 % -  12/13/14 1614 - 98.1 F (36.7 C) Oral - - - -  12/13/14 1600 133/84 mmHg - - 77 (!) 21 93 % -  12/13/14 1412 - - - - - 95 % -  12/13/14 1155 140/76 mmHg 98 F (36.7 C) Oral 81 16 92 % -   Current Weight  12/14/14 175 lb 4.3 oz (79.5 kg)     Intake/Output from previous day: 04/15 0701 - 04/16 0700 In: 752 [P.O.:702; IV Piggyback:50] Out: 1060 [Urine:900; Chest Tube:160]    PHYSICAL EXAM:  Heart: RRR Lungs: Coarse rhonchi on R Chest tube: Intermittent air leak    Lab Results: CBC: Recent Labs  12/12/14 0235 12/13/14 0340  WBC 21.2* 14.6*  HGB 12.6* 11.3*  HCT 37.9* 34.2*  PLT 310 307   BMET:  Recent Labs  12/12/14 0235 12/13/14 0340  NA 139 140  K 4.0 3.6  CL 100 101  CO2 29 31  GLUCOSE 150* 146*  BUN 13 15  CREATININE 0.65 0.51  CALCIUM 8.4 7.8*    PT/INR: No results for input(s): LABPROT, INR in the last 72 hours.  CXR: FINDINGS: Cardiac shadow is within normal limits. The left lung is clear. A right-sided chest tube is again identified. No definitive pneumothorax is seen. Considerable subcutaneous emphysema is again noted. Mild right upper lobe cavitary lesion is again identified and stable.  IMPRESSION: No right-sided pneumothorax. The overall appearance  is stable from the prior exam.   Assessment/Plan: Necrotizing pneumonia with spontaneous ptx- CXR stable, CT with small intermittent air leak. Continue CT to low suction for now. Medical care per hospitalist service.   LOS: 3 days    COLLINS,GINA H 12/14/2014  Patient seen and examined, agree with above No pneumo, still has subcutaneous air and a small air leak  Remo Lipps C. Roxan Hockey, MD [facility] [phone]

## 2014-12-14 NOTE — Progress Notes (Signed)
[place] TEAM 1 - Stepdown/ICU TEAM Progress Note  Jason Ballard BSW:[id number] DOB: March 26, 1966 DOA: 12/11/2014 PCP: Bonnita Nasuti, MD  Admit HPI / Brief Narrative: 49 y/o male w/ Hx of COPD and asthma who presented from [facility] management of rt chest tube and possible VATS. He was admitted to [facility] on 4/10 for RML PNA and tx with azithromycin and rocephin.  CT chest revealed a cavitating PNA and the pt developed a pneumothorax after which a chest tube was placed on 4/11. Pt was placed on BIPAP while at the OSH which caused a chest tube leak and subsequent subcutaneous emphysema. Sputum culture and blood cultures obtained at the OSH were all confimed negative on 4/13.   HPI/Subjective: No new complaints today.  Continues to experience some pain in the insertion site of his chest tube.  Assessment/Plan:  Acute on chronic respiratory failure due to severe cavitary community-acquired pneumonia Respiratory failure has essentially resolved with patient now requiring only 1 L of nasal cannula oxygen  RUL Cavitary pneumonia Continue empiric antibiotic therapy  Pneumothorax Chest tube management per TCTS - air leak persists  COPD Well compensated at present without active wheeze  Hypertension Reasonably controlled at this time - follow without change in medical therapy today  Chronic benzodiazepine use Continue low dose scheduled benzodiazepine to avoid withdrawal  Code Status: FULL Family Communication: Discussed plan of care with patient and his wife in the bedside Disposition Plan: SDU  Consultants: TCTS  Antibiotics: Rocephin 4/11 > Azithromycin 4/11 >  DVT prophylaxis: Subcutaneous heparin  Objective: Blood pressure 116/55, pulse 66, temperature 98 F (36.7 C), temperature source Oral, resp. rate 16, height 5\' 9"  (1.753 m), weight 79.5 kg (175 lb 4.3 oz), SpO2 93 %.  Intake/Output Summary (Last 24 hours) at 12/14/14 1606 Last data filed at  12/14/14 1300  Gross per 24 hour  Intake    752 ml  Output    580 ml  Net    172 ml   Exam: General: No acute respiratory distress in bed Lungs: without wheezes or focal crackles - diffuse subcutaneous air auscultated on exam again Cardiovascular: Regular rate and rhythm without murmur gallop or rub normal S1 and S2 Abdomen: Nontender, nondistended, soft, bowel sounds positive, no rebound, no ascites, no appreciable mass Extremities: No significant cyanosis, clubbing, or edema bilateral lower extremities  Data Reviewed: Basic Metabolic Panel:  Recent Labs Lab 12/11/14 2151 12/12/14 0235 12/13/14 0340  NA 139 139 140  K 4.5 4.0 3.6  CL 99 100 101  CO2 30 29 31   GLUCOSE 173* 150* 146*  BUN 17 13 15   CREATININE 0.70 0.65 0.51  CALCIUM 8.4 8.4 7.8*  MG  --   --  2.5  PHOS  --   --  2.4    Liver Function Tests:  Recent Labs Lab 12/11/14 2151  AST 24  ALT 19  ALKPHOS 72  BILITOT 0.5  PROT 5.5*  ALBUMIN 2.5*   CBC:  Recent Labs Lab 12/11/14 2151 12/12/14 0235 12/13/14 0340  WBC 20.3* 21.2* 14.6*  NEUTROABS 19.0*  --   --   HGB 12.7* 12.6* 11.3*  HCT 37.9* 37.9* 34.2*  MCV 90.2 90.7 90.0  PLT 272 310 307   CBG:  Recent Labs Lab 12/13/14 1154 12/13/14 1701 12/13/14 2128 12/14/14 0758 12/14/14 1148  GLUCAP 144* 178* 134* 147* 137*    Recent Results (from the past 240 hour(s))  MRSA PCR Screening     Status: None  Collection Time: 12/11/14  9:49 PM  Result Value Ref Range Status   MRSA by PCR NEGATIVE NEGATIVE Final    Comment:        The GeneXpert MRSA Assay (FDA approved for NASAL specimens only), is one component of a comprehensive MRSA colonization surveillance program. It is not intended to diagnose MRSA infection nor to guide or monitor treatment for MRSA infections.      Studies:   Recent x-ray studies have been reviewed in detail by the Attending Physician  Scheduled Meds:  Scheduled Meds: . ALPRAZolam  0.5 mg Oral TID  .  antiseptic oral rinse  7 mL Mouth Rinse BID  . azithromycin  250 mg Oral QHS  . budesonide  0.25 mg Nebulization Q6H  . cefTRIAXone (ROCEPHIN)  IV  1 g Intravenous Q24H  . heparin  5,000 Units Subcutaneous 3 times per day  . insulin aspart  0-5 Units Subcutaneous QHS  . insulin aspart  0-9 Units Subcutaneous TID WC  . ipratropium-albuterol  3 mL Nebulization Q6H  . pantoprazole  40 mg Oral Daily  . roflumilast  500 mcg Oral Daily    Time spent on care of this patient: 25 mins   Cherene Altes , MD   [facility] Office  [phone] Pager - Text Page per Shea Evans as per below:  On-Call/Text Page:      [email or web address]      password [email or web address]  If 7PM-7AM, please contact night-coverage [email or web address] Password [email or web address] 12/14/2014, 4:06 PM   LOS: 3 days

## 2014-12-15 ENCOUNTER — Inpatient Hospital Stay (HOSPITAL_COMMUNITY): Payer: Medicare Other

## 2014-12-15 DIAGNOSIS — J9383 Other pneumothorax: Secondary | ICD-10-CM

## 2014-12-15 LAB — GLUCOSE, CAPILLARY
GLUCOSE-CAPILLARY: 91 mg/dL (ref 70–99)
GLUCOSE-CAPILLARY: 96 mg/dL (ref 70–99)
Glucose-Capillary: 134 mg/dL — ABNORMAL HIGH (ref 70–99)
Glucose-Capillary: 144 mg/dL — ABNORMAL HIGH (ref 70–99)

## 2014-12-15 MED ORDER — UMECLIDINIUM BROMIDE 62.5 MCG/INH IN AEPB: 1.0000 | INHALATION_SPRAY | Freq: Every day | RESPIRATORY_TRACT | Status: DC

## 2014-12-15 MED ORDER — IPRATROPIUM-ALBUTEROL 0.5-2.5 (3) MG/3ML IN SOLN
3.0000 mL | RESPIRATORY_TRACT | Status: DC | PRN
  Administered 2014-12-17: 3 mL via RESPIRATORY_TRACT
  Filled 2014-12-15 (×2): qty 3

## 2014-12-15 MED ORDER — MOMETASONE FURO-FORMOTEROL FUM 200-5 MCG/ACT IN AERO
2.0000 | INHALATION_SPRAY | Freq: Two times a day (BID) | RESPIRATORY_TRACT | Status: DC
  Administered 2014-12-15 – 2014-12-24 (×15): 2 via RESPIRATORY_TRACT
  Filled 2014-12-15 (×4): qty 8.8

## 2014-12-15 MED ORDER — FLUTICASONE FUROATE-VILANTEROL 100-25 MCG/INH IN AEPB: 1.0000 | INHALATION_SPRAY | Freq: Every day | RESPIRATORY_TRACT | Status: DC

## 2014-12-15 NOTE — Progress Notes (Signed)
  [place] TEAM 1 - Stepdown/ICU TEAM  Vitals, laboratories, chart in general reviewed.  All active medical issues at present are being addressed per TCTS.  I have opted to not visit the patient today and will follow-up in the morning.  Cherene Altes, MD [facility] For Consults/Admissions - Flow Manager [phone] Office  [phone]  Contact MD directly via text page:      [email or web address]      password [facility]

## 2014-12-15 NOTE — Progress Notes (Addendum)
       [address]       [place], [address]             [phone]               Subjective: Still with cough, but feels better today.   Objective: Vital signs in last 24 hours: Patient Vitals for the past 24 hrs:  BP Temp Temp src Pulse Resp SpO2  12/15/14 0800 110/66 mmHg - - 85 (!) 23 95 %  12/15/14 0718 - - - - - 93 %  12/15/14 0417 (!) 119/59 mmHg 98.3 F (36.8 C) Oral 69 14 96 %  12/15/14 0156 - - - 70 15 95 %  12/14/14 2328 123/66 mmHg 98.1 F (36.7 C) Oral 70 16 94 %  12/14/14 2040 - - - - - 94 %  12/14/14 2021 124/75 mmHg 98 F (36.7 C) Oral 68 19 91 %  12/14/14 1600 138/78 mmHg - - 63 (!) 27 95 %  12/14/14 1200 125/67 mmHg - - 64 17 93 %  12/14/14 1147 (!) 116/55 mmHg 98 F (36.7 C) Oral 66 16 93 %   Current Weight  12/14/14 175 lb 4.3 oz (79.5 kg)     Intake/Output from previous day: 04/16 0701 - 04/17 0700 In: 650 [P.O.:600; IV Piggyback:50] Out: 0768 [Urine:1275; Chest Tube:400]    PHYSICAL EXAM:  Heart: RRR Lungs: Few coarse BS on R, overall improved from yesterday Chest tube: Mostly tidaling with cough, minimal air leak    Lab Results: CBC: Recent Labs  12/13/14 0340  WBC 14.6*  HGB 11.3*  HCT 34.2*  PLT 307   BMET:  Recent Labs  12/13/14 0340  NA 140  K 3.6  CL 101  CO2 31  GLUCOSE 146*  BUN 15  CREATININE 0.51  CALCIUM 7.8*    PT/INR: No results for input(s): LABPROT, INR in the last 72 hours.  CXR: FINDINGS: Unchanged positioning of the right-sided chest tube. A trace right apical pneumothorax is present, 5% or less. Extensive right chest wall emphysema is stable.  The cavitary pneumonia in the right upper lobe it is again noted. Clear left chest. Normal heart size.  IMPRESSION: 1. Trace right apical pneumothorax with unchanged chest tube positioning. 2. Cavitary right upper lobe pneumonia.  Assessment/Plan: Necrotizing pneumonia with spontaneous ptx- CXR stable, CT with small  intermittent air leak. May be able to decrease CT to water seal. Medical care per hospitalist service.   LOS: 4 days    COLLINS,GINA H 12/15/2014  Patient seen and examined Agree with above, but he definitely does have a small air leak at present time  Remo Lipps C. Roxan Hockey, MD [facility] [phone]

## 2014-12-16 ENCOUNTER — Inpatient Hospital Stay (HOSPITAL_COMMUNITY): Payer: Medicare Other

## 2014-12-16 DIAGNOSIS — J189 Pneumonia, unspecified organism: Secondary | ICD-10-CM

## 2014-12-16 DIAGNOSIS — R0781 Pleurodynia: Secondary | ICD-10-CM

## 2014-12-16 LAB — COMPREHENSIVE METABOLIC PANEL
ALT: 53 U/L (ref 0–53)
AST: 27 U/L (ref 0–37)
Albumin: 2.1 g/dL — ABNORMAL LOW (ref 3.5–5.2)
Alkaline Phosphatase: 72 U/L (ref 39–117)
Anion gap: 9 (ref 5–15)
BILIRUBIN TOTAL: 0.7 mg/dL (ref 0.3–1.2)
BUN: 11 mg/dL (ref 6–23)
CALCIUM: 7.9 mg/dL — AB (ref 8.4–10.5)
CHLORIDE: 98 mmol/L (ref 96–112)
CO2: 29 mmol/L (ref 19–32)
CREATININE: 0.67 mg/dL (ref 0.50–1.35)
GFR calc Af Amer: >90 mL/min (ref >90)
Glucose, Bld: 104 mg/dL — ABNORMAL HIGH (ref 70–99)
Potassium: 4.2 mmol/L (ref 3.5–5.1)
SODIUM: 136 mmol/L (ref 135–145)
Total Protein: 5.1 g/dL — ABNORMAL LOW (ref 6.0–8.3)

## 2014-12-16 LAB — CBC
HCT: 43.7 % (ref 39.0–52.0)
Hemoglobin: 14.4 g/dL (ref 13.0–17.0)
MCH: 29.9 pg (ref 26.0–34.0)
MCHC: 33 g/dL (ref 30.0–36.0)
MCV: 90.7 fL (ref 78.0–100.0)
Platelets: 336 10*3/uL (ref 150–400)
RBC: 4.82 MIL/uL (ref 4.22–5.81)
RDW: 13.5 % (ref 11.5–15.5)
WBC: 24.2 10*3/uL — ABNORMAL HIGH (ref 4.0–10.5)

## 2014-12-16 LAB — GLUCOSE, CAPILLARY
GLUCOSE-CAPILLARY: 88 mg/dL (ref 70–99)
GLUCOSE-CAPILLARY: 107 mg/dL — AB (ref 70–99)
Glucose-Capillary: 113 mg/dL — ABNORMAL HIGH (ref 70–99)

## 2014-12-16 MED ORDER — DEXTROSE 5 % IV SOLN
1.0000 g | Freq: Three times a day (TID) | INTRAVENOUS | Status: DC
  Administered 2014-12-16 – 2014-12-18 (×6): 1 g via INTRAVENOUS
  Filled 2014-12-16 (×11): qty 1

## 2014-12-16 MED ORDER — OXYCODONE HCL 5 MG PO TABS
5.0000 mg | ORAL_TABLET | ORAL | Status: DC | PRN
  Administered 2014-12-16 – 2014-12-17 (×5): 10 mg via ORAL
  Filled 2014-12-16 (×5): qty 2

## 2014-12-16 MED ORDER — VANCOMYCIN HCL IN DEXTROSE 1-5 GM/200ML-% IV SOLN
1000.0000 mg | Freq: Three times a day (TID) | INTRAVENOUS | Status: DC
Start: 2014-12-16 — End: 2014-12-18
  Administered 2014-12-16 – 2014-12-18 (×6): 1000 mg via INTRAVENOUS
  Filled 2014-12-16 (×13): qty 200

## 2014-12-16 MED ORDER — HYDROMORPHONE HCL 1 MG/ML IJ SOLN
0.5000 mg | INTRAMUSCULAR | Status: DC | PRN
  Administered 2014-12-16 – 2014-12-17 (×6): 1 mg via INTRAVENOUS
  Filled 2014-12-16 (×6): qty 1

## 2014-12-16 NOTE — Progress Notes (Addendum)
Pt with Chest drainage system from [facility]. Drainage system changed to the [place] chest drain system pt tolerated well will monitor patient. Ediberto Sens, Bettina Gavia RN

## 2014-12-16 NOTE — Progress Notes (Signed)
ANTIBIOTIC CONSULT NOTE - INITIAL  Pharmacy Consult for Vancomcyin Indication: pneumonia  No Known Allergies  Patient Measurements: Height: 5\' 9"  (175.3 cm) Weight: 165 lb 9.1 oz (75.1 kg) IBW/kg (Calculated) : 70.7  Vital Signs: Temp: 98 F (36.7 C) (04/18 1200) Temp Source: Oral (04/18 1200) BP: 113/72 mmHg (04/18 1200) Pulse Rate: 76 (04/18 1200) Intake/Output from previous day: 04/17 0701 - 04/18 0700 In: 770 [P.O.:720; IV Piggyback:50] Out: 1630 [Urine:1000; Chest Tube:630] Intake/Output from this shift: Total I/O In: 120 [P.O.:120] Out: 1 [Stool:1]  Labs:  Recent Labs  12/16/14 0305  WBC 24.2*  HGB 14.4  PLT 336  CREATININE 0.67   Estimated Creatinine Clearance: 111.7 mL/min (by C-G formula based on Cr of 0.67). No results for input(s): VANCOTROUGH, VANCOPEAK, VANCORANDOM, GENTTROUGH, GENTPEAK, GENTRANDOM, TOBRATROUGH, TOBRAPEAK, TOBRARND, AMIKACINPEAK, AMIKACINTROU, AMIKACIN in the last 72 hours.   Microbiology: Recent Results (from the past 720 hour(s))  MRSA PCR Screening     Status: None   Collection Time: 12/11/14  9:49 PM  Result Value Ref Range Status   MRSA by PCR NEGATIVE NEGATIVE Final    Comment:        The GeneXpert MRSA Assay (FDA approved for NASAL specimens only), is one component of a comprehensive MRSA colonization surveillance program. It is not intended to diagnose MRSA infection nor to guide or monitor treatment for MRSA infections.     Medical History: Past Medical History  Diagnosis Date  . COPD (chronic obstructive pulmonary disease)   . Asthma   . Emphysema     Medications:  Prescriptions prior to admission  Medication Sig Dispense Refill Last Dose  . acetaminophen (TYLENOL) 500 MG tablet Take 500 mg by mouth every 6 (six) hours as needed for fever.   12/08/2014  . albuterol (PROVENTIL HFA;VENTOLIN HFA) 108 (90 BASE) MCG/ACT inhaler Inhale 2 puffs into the lungs every 6 (six) hours as needed for wheezing.   12/05/2014   . ALPRAZolam (XANAX) 1 MG tablet Take 1 mg by mouth 3 (three) times daily.   12/05/2014  . BREO ELLIPTA 100-25 MCG/INH AEPB Inhale 1 puff into the lungs daily at 12 noon.   12/05/2014  . ibuprofen (ADVIL,MOTRIN) 200 MG tablet Take 400 mg by mouth every 6 (six) hours as needed for fever.   12/08/2014  . oxyCODONE-acetaminophen (PERCOCET) 10-325 MG per tablet Take 1 tablet by mouth 5 (five) times daily.   12/05/2014  . roflumilast (DALIRESP) 500 MCG TABS tablet Take 500 mcg by mouth daily.   12/05/2014  . Umeclidinium Bromide 62.5 MCG/INH AEPB Inhale 1 puff into the lungs daily at 12 noon.   12/05/2014  . HYDROmorphone (DILAUDID) 4 MG tablet Take 1 tablet (4 mg total) by mouth every 4 (four) hours as needed for pain. (Patient not taking: Reported on 12/12/2014) 45 tablet 0 Not Taking at Unknown time   Assessment: 49 y.o. male presents from [facility] on 4/13 for management of R chest tube. Pt has been on abx (Rocephin and Azithromycin) Day #6 for RUL cavitary PNA. To broaden antibiotics to Vancomycin and Cefepime to provide HCAP coverage with newly developed LLL infiltrate on CXR. SCr 0.67, est CrCl > 100 ml/min. WBC up to 24.2. Afeb.  Goal of Therapy:  Vancomycin trough level 15-20 mcg/ml  Plan:  Vancomycin 1gm IV q8h Will f/u micro data, pt's clinical condition, and renal function Vanc trough prn  Sherlon Handing, PharmD, BCPS Clinical pharmacist, pager [phone] 12/16/2014,2:58 PM

## 2014-12-16 NOTE — Progress Notes (Signed)
[place] TEAM 1 - Stepdown/ICU TEAM Progress Note  Jason Ballard QPR:[id number] DOB: 1966/03/05 DOA: 12/11/2014 PCP: Bonnita Nasuti, MD  Admit HPI / Brief Narrative: 49 y/o male w/ Hx of COPD and asthma who presented from [facility] management of rt chest tube and possible VATS. He was admitted to [facility] on 4/10 for RML PNA and tx with azithromycin and rocephin.  CT chest revealed a cavitating PNA and the pt developed a pneumothorax after which a chest tube was placed on 4/11. Pt was placed on BIPAP while at the OSH which caused a chest tube leak and subsequent subcutaneous emphysema. Sputum culture and blood cultures obtained at the OSH were all confimed negative on 4/13.   HPI/Subjective: Patient is having more difficulty with chest pain focused on his chest tube today.  He denies current shortness of breath abdominal pain nausea or vomiting.  Assessment/Plan:  Acute on chronic respiratory failure due to severe cavitary community-acquired pneumonia Respiratory failure has essentially resolved with patient now requiring only 1-2 L of nasal cannula oxygen  RUL Cavitary pneumonia Continue empiric antibiotic therapy - given her newly developed left lower lobe infiltrate on x-ray (which may simply be atelectasis) combined with climbing white blood cell count will broaden antibiotic coverage to provide healthcare acquired pneumonia coverage and follow  Pneumothorax Chest tube management per TCTS  COPD Well compensated at present without active wheeze  Hypertension controlled at this time - follow without change in medical therapy today  Chronic benzodiazepine use Continue low dose scheduled benzodiazepine to avoid withdrawal  Code Status: FULL Family Communication: Discussed plan of care with patient and his wife in the bedside Disposition Plan: Stable for transfer to unit 2 W - chest tube removal at discretion of thoracic surgery - follow with broadening  antibiotic therapy  Consultants: TCTS  Antibiotics: Rocephin 4/11 > 4/17 Azithromycin 4/11 > 4/17  DVT prophylaxis: Subcutaneous heparin  Objective: Blood pressure 113/72, pulse 76, temperature 98 F (36.7 C), temperature source Oral, resp. rate 22, height 5\' 9"  (1.753 m), weight 75.1 kg (165 lb 9.1 oz), SpO2 96 %.  Intake/Output Summary (Last 24 hours) at 12/16/14 1359 Last data filed at 12/16/14 0847  Gross per 24 hour  Intake    650 ml  Output   1356 ml  Net   -706 ml   Exam: General: No acute respiratory distress in bed  -alert and conversant Lungs: Left basilar crackles - no wheeze  Cardiovascular: Regular rate and rhythm without murmur gallop or rub  Abdomen: Nontender, nondistended, soft, bowel sounds positive, no rebound, no ascites, no appreciable mass Extremities: No significant cyanosis, clubbing, or edema bilateral lower extremities  Data Reviewed: Basic Metabolic Panel:  Recent Labs Lab 12/11/14 2151 12/12/14 0235 12/13/14 0340 12/16/14 0305  NA 139 139 140 136  K 4.5 4.0 3.6 4.2  CL 99 100 101 98  CO2 30 29 31 29   GLUCOSE 173* 150* 146* 104*  BUN 17 13 15 11   CREATININE 0.70 0.65 0.51 0.67  CALCIUM 8.4 8.4 7.8* 7.9*  MG  --   --  2.5  --   PHOS  --   --  2.4  --     Liver Function Tests:  Recent Labs Lab 12/11/14 2151 12/16/14 0305  AST 24 27  ALT 19 53  ALKPHOS 72 72  BILITOT 0.5 0.7  PROT 5.5* 5.1*  ALBUMIN 2.5* 2.1*   CBC:  Recent Labs Lab 12/11/14 2151 12/12/14 0235 12/13/14 0340 12/16/14 0305  WBC 20.3* 21.2* 14.6* 24.2*  NEUTROABS 19.0*  --   --   --   HGB 12.7* 12.6* 11.3* 14.4  HCT 37.9* 37.9* 34.2* 43.7  MCV 90.2 90.7 90.0 90.7  PLT 272 310 307 336   CBG:  Recent Labs Lab 12/15/14 1240 12/15/14 1635 12/15/14 2116 12/16/14 0844 12/16/14 1205  GLUCAP 96 134* 144* 113* 88    Recent Results (from the past 240 hour(s))  MRSA PCR Screening     Status: None   Collection Time: 12/11/14  9:49 PM  Result Value  Ref Range Status   MRSA by PCR NEGATIVE NEGATIVE Final    Comment:        The GeneXpert MRSA Assay (FDA approved for NASAL specimens only), is one component of a comprehensive MRSA colonization surveillance program. It is not intended to diagnose MRSA infection nor to guide or monitor treatment for MRSA infections.      Studies:   Recent x-ray studies have been reviewed in detail by the Attending Physician  Scheduled Meds:  Scheduled Meds: . ALPRAZolam  0.5 mg Oral TID  . antiseptic oral rinse  7 mL Mouth Rinse BID  . cefTRIAXone (ROCEPHIN)  IV  1 g Intravenous Q24H  . heparin  5,000 Units Subcutaneous 3 times per day  . mometasone-formoterol  2 puff Inhalation BID  . pantoprazole  40 mg Oral Daily  . roflumilast  500 mcg Oral Daily  . Umeclidinium Bromide  1 puff Inhalation Q1200    Time spent on care of this patient: 35 mins   Sufian Ravi T , MD   [facility] Office  [phone] Pager - Text Page per Shea Evans as per below:  On-Call/Text Page:      [email or web address]      password [email or web address]  If 7PM-7AM, please contact night-coverage [email or web address] Password [email or web address] 12/16/2014, 1:59 PM   LOS: 5 days

## 2014-12-16 NOTE — Progress Notes (Signed)
  Subjective:  Soreness at chest tube site.  Objective: Vital signs in last 24 hours: Temp:  [97.8 F (36.6 C)-98.2 F (36.8 C)] 98 F (36.7 C) (04/18 1200) Pulse Rate:  [70-94] 76 (04/18 1200) Cardiac Rhythm:  [-] Normal sinus rhythm (04/18 1200) Resp:  [17-28] 22 (04/18 1200) BP: (109-128)/(62-78) 113/72 mmHg (04/18 1200) SpO2:  [90 %-96 %] 96 % (04/18 1200) Weight:  [75.1 kg (165 lb 9.1 oz)] 75.1 kg (165 lb 9.1 oz) (04/18 0435)  Hemodynamic parameters for last 24 hours:    Intake/Output from previous day: 04/17 0701 - 04/18 0700 In: 770 [P.O.:720; IV Piggyback:50] Out: 1630 [Urine:1000; Chest Tube:630] Intake/Output this shift: Total I/O In: 360 [P.O.:360] Out: 201 [Urine:200; Stool:1]  General appearance: alert and cooperative Lungs: bibasilar crackles no air leak from chest tube but still tidals  Lab Results:  Recent Labs  12/16/14 0305  WBC 24.2*  HGB 14.4  HCT 43.7  PLT 336   BMET:  Recent Labs  12/16/14 0305  NA 136  K 4.2  CL 98  CO2 29  GLUCOSE 104*  BUN 11  CREATININE 0.67  CALCIUM 7.9*    PT/INR: No results for input(s): LABPROT, INR in the last 72 hours. ABG No results found for: PHART, HCO3, TCO2, ACIDBASEDEF, O2SAT CBG (last 3)   Recent Labs  12/15/14 2116 12/16/14 0844 12/16/14 1205  GLUCAP 144* 113* 88   CXR: no ptx.  Assessment/Plan:  Necrotizing pneumonia with right ptx. No air leak today so will put to water seal and observe. Check CXR in am.  Antibiotic coverage broadened by medicine due to rising WBC ct. I think his CXR looks about the same. It is certainly possible that he could develop an empyema so may need to repeat his chest CT.    LOS: 5 days    Gaye Pollack 12/16/2014

## 2014-12-16 NOTE — Progress Notes (Signed)
Report given to receiving nurse Cyril Mourning. Patient transferred to 2W20.

## 2014-12-16 NOTE — Progress Notes (Signed)
Pt transferred from [place] to 2w20, upon initial assessment VSS, CT to water seal and Air leak present. Will monitor patient. Shaia Porath, Bettina Gavia RN

## 2014-12-16 NOTE — Progress Notes (Signed)
PT Cancellation Note  Patient Details Name: Javone Ybanez MRN: [id number] DOB: 10-14-1965   Cancelled Treatment:    Reason Eval/Treat Not Completed: Pain limiting ability to participate (pt reports pain 8/10 and doesn't want to move until pain under control. RN aware. Will attempt later as time and pt status allow.)   Lanetta Inch [facility] 12/16/2014, 8:44 AM Elwyn Reach, [place]

## 2014-12-17 ENCOUNTER — Inpatient Hospital Stay (HOSPITAL_COMMUNITY): Payer: Medicare Other

## 2014-12-17 ENCOUNTER — Encounter (HOSPITAL_COMMUNITY): Payer: Self-pay | Admitting: Radiology

## 2014-12-17 LAB — CBC
HCT: 45.9 % (ref 39.0–52.0)
Hemoglobin: 15.7 g/dL (ref 13.0–17.0)
MCH: 31 pg (ref 26.0–34.0)
MCHC: 34.2 g/dL (ref 30.0–36.0)
MCV: 90.5 fL (ref 78.0–100.0)
PLATELETS: ADEQUATE 10*3/uL (ref 150–400)
RBC: 5.07 MIL/uL (ref 4.22–5.81)
RDW: 13.8 % (ref 11.5–15.5)
WBC: 38.3 10*3/uL — ABNORMAL HIGH (ref 4.0–10.5)

## 2014-12-17 LAB — HIV ANTIBODY (ROUTINE TESTING W REFLEX): HIV Screen 4th Generation wRfx: NONREACTIVE

## 2014-12-17 MED ORDER — IOHEXOL 350 MG/ML SOLN
80.0000 mL | Freq: Once | INTRAVENOUS | Status: AC | PRN
  Administered 2014-12-17: 80 mL via INTRAVENOUS

## 2014-12-17 MED ORDER — OXYCODONE HCL 5 MG PO TABS
5.0000 mg | ORAL_TABLET | ORAL | Status: DC | PRN
  Administered 2014-12-17 – 2014-12-18 (×5): 10 mg via ORAL
  Filled 2014-12-17 (×6): qty 2

## 2014-12-17 MED ORDER — HYDROMORPHONE HCL 1 MG/ML IJ SOLN
2.0000 mg | INTRAMUSCULAR | Status: DC | PRN
  Administered 2014-12-17 – 2014-12-18 (×8): 2 mg via INTRAVENOUS
  Filled 2014-12-17 (×9): qty 2

## 2014-12-17 MED ORDER — BENZONATATE 100 MG PO CAPS
100.0000 mg | ORAL_CAPSULE | Freq: Three times a day (TID) | ORAL | Status: DC
  Administered 2014-12-17 – 2014-12-24 (×20): 100 mg via ORAL
  Filled 2014-12-17 (×28): qty 1

## 2014-12-17 MED ORDER — GUAIFENESIN-CODEINE 100-10 MG/5ML PO SOLN: 5.0000 mL | Freq: Four times a day (QID) | ORAL | Status: DC | PRN

## 2014-12-17 NOTE — Progress Notes (Signed)
Physical Therapy Treatment Patient Details Name: Jason Ballard MRN: [id number] DOB: [birth year] Today's Date: 12/17/2014    History of Present Illness Jason Ballard is a 49 y/o male w/ PMHx of COPD, emphysema, and asthma who was admitted for chest tube management and VADs from cavitating PNA complicated by pneumothorax.    PT Comments    Pt with increased pain at chest tube site at time of therapy session. Pt was sleeping on his stomach when therapist entered and refuses to participate in session until he ate breakfast. Pt was agreeable to get set-up in the recliner chair to eat. Will continue to follow and progress as able per POC.   Follow Up Recommendations  Home health PT (evel of assistance pending resolution of confusion)     Equipment Recommendations  Rolling walker with 5" wheels    Recommendations for Other Services       Precautions / Restrictions Precautions Precautions: Other (comment) (Chest tube) Precaution Comments: Chest tube management Restrictions Weight Bearing Restrictions: No    Mobility  Bed Mobility Overal bed mobility: Needs Assistance Bed Mobility: Supine to Sit     Supine to sit: Supervision     General bed mobility comments: Pt initially laying on his stomach sleeping and was able to maneuver himself around to sitting EOB without assist and with management of chest tube.   Transfers Overall transfer level: Needs assistance Equipment used: Rolling walker (2 wheeled) Transfers: Sit to/from Stand Sit to Stand: Min guard         General transfer comment: Min guard for safety as pt powered up to full standing position. Initially slight unsteadiness noted.   Ambulation/Gait Ambulation/Gait assistance: Min guard Ambulation Distance (Feet): 5 Feet Assistive device: 1 person hand held assist Gait Pattern/deviations: Decreased stride length;Trunk flexed;Step-through pattern Gait velocity: decreased Gait velocity interpretation: Below normal  speed for age/gender General Gait Details: Pt only agreeable to transition to the chair which was about ~5 feet from bed.    Stairs            Wheelchair Mobility    Modified Rankin (Stroke Patients Only)       Balance Overall balance assessment: Needs assistance Sitting-balance support: Feet supported;No upper extremity supported Sitting balance-Leahy Scale: Good     Standing balance support: Single extremity supported Standing balance-Leahy Scale: Fair                      Cognition Arousal/Alertness: Awake/alert Behavior During Therapy: WFL for tasks assessed/performed Overall Cognitive Status: Impaired/Different from baseline Area of Impairment: Memory;Following commands;Safety/judgement;Problem solving     Memory: Decreased short-term memory Following Commands: Follows one step commands consistently;Follows one step commands with increased time Safety/Judgement: Decreased awareness of safety;Decreased awareness of deficits   Problem Solving: Slow processing;Decreased initiation;Difficulty sequencing;Requires verbal cues General Comments: Pt not able to recall if RN had given pain meds. Check with nursing who states pt has asked multiple times if he had gotten pain medication (which he had).     Exercises      General Comments        Pertinent Vitals/Pain Pain Assessment: Faces Pain Score: 0-No pain Faces Pain Scale: Hurts little more Pain Location: R chest tube site Pain Descriptors / Indicators: Aching Pain Intervention(s): Limited activity within patient's tolerance;Monitored during session;Repositioned;Premedicated before session    Home Living                      Prior Function  PT Goals (current goals can now be found in the care plan section) Acute Rehab PT Goals Patient Stated Goal: Ride his motorcycle PT Goal Formulation: With patient Time For Goal Achievement: 12/26/14 Potential to Achieve Goals:  Good Progress towards PT goals: Progressing toward goals    Frequency  Min 3X/week    PT Plan Current plan remains appropriate    Co-evaluation             End of Session   Activity Tolerance: Patient tolerated treatment well Patient left: in chair;with call bell/phone within reach;with family/visitor present     Time: 0851-0905 PT Time Calculation (min) (ACUTE ONLY): 14 min  Charges:  $Therapeutic Activity: 8-22 mins                    G Codes:      Rolinda Roan December 28, 2014, 1:03 PM  Rolinda Roan, PT, DPT Acute Rehabilitation Services Pager: [phone]

## 2014-12-17 NOTE — Progress Notes (Signed)
PRN HHN given for wheezing

## 2014-12-17 NOTE — Progress Notes (Signed)
[facility]                                                                              Patient Demographics  Toribio Seiber, is a 49 y.o. male, DOB - Oct 15, 1965, XAJ:[id number]  Admit date - 12/11/2014   Admitting Physician Juanito Doom, MD  Outpatient Primary MD for the patient is HAGUE, Rosalyn Charters, MD  LOS - 6    chief complaint : Shortness of breath      Brief HPI   Patient is a 49 year old male with history of COPD, asthma, presented from [facility] with acute respiratory failure. He was admitted to [facility] on 4/10 with a right middle lobe pneumonia. Patient was treated with Zithromax and Rocephin. CT chest revealed a cavitating pneumonia and subsequently patient developed pneumothorax. Chest tube was placed on 4/11 and patient was placed on BiPAP while at the OSH which caused a chest tube leak and subsequent subcutaneous emphysema. Sputum culture and blood cultures obtained at the OSH were all confimed negative on 4/13. Patient was transferred to [facility] for right chest tube and possible VATS.    Assessment & Plan    Principal Problems: Acute on chronic respiratory failure due to severe cavitary community-acquired pneumonia, right upper lung cavitary pneumonia - Currently improving, transferred out of the stepdown, O2 sats 94% on 2 L  - Currently on IV vancomycin and cefepime   Active problems  Pneumothorax Chest tube management per TCTS  COPD currently improving, no active wheezing  Hypertension controlled at this time   Chronic benzodiazepine use Continue low dose xanax to avoid withdrawal  Chronic pain issues- states pain is not well controlled Placed on IV Dilaudid, patient takes oral Dilaudid and Percocet at home  Code Status: full code  Family Communication: Discussed in detail with the patient, all imaging results, lab results explained to the patient   Disposition Plan: No disposition plans until chest tubes  still on  Time Spent in minutes   25 minutes  Procedures  Chest tube   Consults   Cardiothoracic surgery  DVT Prophylaxis   heparin Medications  Scheduled Meds: . ALPRAZolam  0.5 mg Oral TID  . antiseptic oral rinse  7 mL Mouth Rinse BID  . benzonatate  100 mg Oral TID  . ceFEPime (MAXIPIME) IV  1 g Intravenous Q8H  . heparin  5,000 Units Subcutaneous 3 times per day  . mometasone-formoterol  2 puff Inhalation BID  . pantoprazole  40 mg Oral Daily  . roflumilast  500 mcg Oral Daily  . Umeclidinium Bromide  1 puff Inhalation Q1200  . vancomycin  1,000 mg Intravenous Q8H   Continuous Infusions:  PRN Meds:.acetaminophen **OR** acetaminophen, guaiFENesin-codeine, HYDROmorphone (DILAUDID) injection, ipratropium-albuterol, oxyCODONE   Antibiotics   Anti-infectives    Start     Dose/Rate Route Frequency Ordered Stop   12/16/14 1600  vancomycin (VANCOCIN) IVPB 1000 mg/200 mL premix     1,000 mg 200 mL/hr over 60 Minutes Intravenous Every 8 hours 12/16/14 1508     12/16/14 1500  ceFEPIme (MAXIPIME) 1 g in dextrose 5 % 50 mL IVPB  1 g 100 mL/hr over 30 Minutes Intravenous Every 8 hours 12/16/14 1430 12/24/14 1459   12/13/14 2200  azithromycin (ZITHROMAX) tablet 250 mg     250 mg Oral Daily at bedtime 12/13/14 0821 12/15/14 2232   12/11/14 2100  azithromycin (ZITHROMAX) 250 mg in dextrose 5 % 125 mL IVPB  Status:  Discontinued     250 mg 125 mL/hr over 60 Minutes Intravenous Every 24 hours 12/11/14 2024 12/13/14 0821   12/11/14 2045  cefTRIAXone (ROCEPHIN) 1 g in dextrose 5 % 50 mL IVPB - Premix  Status:  Discontinued     1 g 100 mL/hr over 30 Minutes Intravenous Every 24 hours 12/11/14 2031 12/16/14 1427        Subjective:   Tivon Lemoine was seen and examined today. Complaining of chronic pain issues, states that current pain medications are not working. Patient denies dizziness, abdominal pain, N/V/D/C, new weakness, numbess, tingling. No acute events overnight.  No  chest pain at current time.  Objective:   Blood pressure 108/58, pulse 77, temperature 98.6 F (37 C), temperature source Oral, resp. rate 18, height 5\' 9"  (1.753 m), weight 75.1 kg (165 lb 9.1 oz), SpO2 94 %.  Wt Readings from Last 3 Encounters:  12/16/14 75.1 kg (165 lb 9.1 oz)     Intake/Output Summary (Last 24 hours) at 12/17/14 1221 Last data filed at 12/17/14 0900  Gross per 24 hour  Intake    120 ml  Output   1150 ml  Net  -1030 ml    Exam  General: Alert and oriented x 3, NAD  HEENT:  PERRLA, EOMI, Anicteic Sclera, mucous membranes moist.   Neck: Supple, no JVD, no masses  CVS: S1 S2 auscultated, no rubs, murmurs or gallops. Regular rate and rhythm.  Respiratory:Left basilar crackles, chest tube  Extremities: cyanosis clubbing or edema  Neuro: AAOx3, Cr N's II- XII. Strength 5/5 upper and lower extremities bilaterally  Skin: No rashes  Psych: Normal affect and demeanor, alert and oriented x3    Data Review   Micro Results Recent Results (from the past 240 hour(s))  MRSA PCR Screening     Status: None   Collection Time: 12/11/14  9:49 PM  Result Value Ref Range Status   MRSA by PCR NEGATIVE NEGATIVE Final    Comment:        The GeneXpert MRSA Assay (FDA approved for NASAL specimens only), is one component of a comprehensive MRSA colonization surveillance program. It is not intended to diagnose MRSA infection nor to guide or monitor treatment for MRSA infections.     Radiology Reports Dg Chest 2 View  12/17/2014   CLINICAL DATA:  49 year old male with shortness of breath and cough. Cavitary right upper lobe pneumonia. Right chest tube. Initial encounter.  EXAM: CHEST  2 VIEW  COMPARISON:  12/16/2014 and earlier.  FINDINGS: Right chest tube in place with moderate volume right chest wall and neck subcutaneous emphysema. Small right pneumothorax, pleural edge now visible in the apex.  Since 12/09/2014 mildly regressed right upper lobe airspace  opacity, but increased right lung base opacity. Right pleural effusion evident on 12/11/2014 CT. The left lung is stable and clear. Stable cardiac size and mediastinal contours. Visualized tracheal air column is within normal limits. No acute osseous abnormality identified.  IMPRESSION: 1. Stable right chest tube with moderate volume right chest wall and neck subcutaneous emphysema. 2. Recurrent small right apical pneumothorax. 3. Mildly regressed right upper lobe pneumonia since 12/09/2014, but worsening  right lung base opacity in part due to pleural effusion.   Electronically Signed   By: Genevie Ann M.D.   On: 12/17/2014 08:04   Dg Chest Port 1 View  12/16/2014   CLINICAL DATA:  Pneumothorax follow-up, acute and chronic respiratory failure, long history of tobacco use now discontinued.  EXAM: PORTABLE CHEST - 1 VIEW  COMPARISON:  Portable chest x-ray of December 15, 2014  FINDINGS: There remains a large amount of subcutaneous emphysema in the right axillary region and base of the neck. Two right-sided chest tubes are present and unchanged in position. No pneumothorax is visible. Confluent alveolar opacities persist in the right perihilar region with areas of lucency consistent with known cavitation. Increased right infrahilar alveolar opacities are noted as well. There is no significant pleural effusion. There is no mediastinal shift. The left lung is clear. The heart and pulmonary vascularity are normal.  IMPRESSION: 1. No pneumothorax is visible today. The chest tubes are unchanged in position. 2. Known cavitary pneumonia on the right, unchanged. Increasing right basilar airspace disease.   Electronically Signed   By: David  Martinique   On: 12/16/2014 07:26   Dg Chest Port 1 View  12/15/2014   CLINICAL DATA:  Pneumothorax  EXAM: PORTABLE CHEST - 1 VIEW  COMPARISON:  12/14/2014  FINDINGS: Unchanged positioning of the right-sided chest tube. A trace right apical pneumothorax is present, 5% or less. Extensive right  chest wall emphysema is stable.  The cavitary pneumonia in the right upper lobe it is again noted. Clear left chest. Normal heart size.  IMPRESSION: 1. Trace right apical pneumothorax with unchanged chest tube positioning. 2. Cavitary right upper lobe pneumonia.   Electronically Signed   By: Monte Fantasia M.D.   On: 12/15/2014 06:08   Dg Chest Port 1 View  12/14/2014   CLINICAL DATA:  Status post chest tube placement  EXAM: PORTABLE CHEST - 1 VIEW  COMPARISON:  12/13/2014  FINDINGS: Cardiac shadow is within normal limits. The left lung is clear. A right-sided chest tube is again identified. No definitive pneumothorax is seen. Considerable subcutaneous emphysema is again noted. Mild right upper lobe cavitary lesion is again identified and stable.  IMPRESSION: No right-sided pneumothorax. The overall appearance is stable from the prior exam.   Electronically Signed   By: Inez Catalina M.D.   On: 12/14/2014 07:14   Dg Chest Port 1 View  12/13/2014   CLINICAL DATA:  Right pneumothorax.  Chest tube.  EXAM: PORTABLE CHEST - 1 VIEW  COMPARISON:  12/12/2014  FINDINGS: The right pneumothorax has resolved. Decreased subcutaneous emphysema on the right. Chest tube remains in good position. Improving cavitary infiltrate in the right upper lobe. Slight progression of new atelectasis at the left lung base laterally. Heart size and pulmonary vascularity are normal.  IMPRESSION: 1. Resolution of right pneumothorax. 2. Improving cavitary infiltrate in the right upper lobe. 3. Slightly increasing left base atelectasis laterally.   Electronically Signed   By: Lorriane Shire M.D.   On: 12/13/2014 07:52   Portable Chest 1 View  12/12/2014   CLINICAL DATA:  Followup pneumonia.  EXAM: PORTABLE CHEST - 1 VIEW  COMPARISON:  12/11/2014  FINDINGS: Since the previous day's study, a small right pneumothorax has become apparent, estimated at 20%. Right-sided chest tube is stable. Significant right-sided subcutaneous emphysema is also  without substantial change.  Airspace consolidation in right upper lobe extending from the right hilum is similar to the prior exam. Reticular opacities are noted in  both lower lungs, increased from the previous day's study, likely atelectasis.  No left pneumothorax.  No convincing pleural effusion.  IMPRESSION: 1. Approximately 20% right-sided pneumothorax, new from the previous day's study. 2. Stable right upper lobe consolidation. 3. Mild increase in lung base reticular opacity most likely atelectasis. No other change.   Electronically Signed   By: Lajean Manes M.D.   On: 12/12/2014 08:02   Dg Chest Port 1 View  12/11/2014   CLINICAL DATA:  Pneumonia.  COPD.  EXAM: PORTABLE CHEST - 1 VIEW  COMPARISON:  12/11/2014.  FINDINGS: The right-sided chest tube is stable. Interval worsening of subcutaneous emphysema suggesting an air leak. No obvious right-sided pneumothorax. Persistent right mid lung infiltrate. The left lung is clear.  IMPRESSION: Stable chest tube but progressive subcutaneous emphysema on the right side. No obvious pneumothorax.   Electronically Signed   By: Marijo Sanes M.D.   On: 12/11/2014 19:30    CBC  Recent Labs Lab 12/11/14 2151 12/12/14 0235 12/13/14 0340 12/16/14 0305 12/17/14 0550  WBC 20.3* 21.2* 14.6* 24.2* 38.3*  HGB 12.7* 12.6* 11.3* 14.4 15.7  HCT 37.9* 37.9* 34.2* 43.7 45.9  PLT 272 310 307 336 PLATELET CLUMPS NOTED ON SMEAR, COUNT APPEARS ADEQUATE  MCV 90.2 90.7 90.0 90.7 90.5  MCH 30.2 30.1 29.7 29.9 31.0  MCHC 33.5 33.2 33.0 33.0 34.2  RDW 13.0 13.0 13.1 13.5 13.8  LYMPHSABS 0.8  --   --   --   --   MONOABS 0.5  --   --   --   --   EOSABS 0.0  --   --   --   --   BASOSABS 0.0  --   --   --   --     Chemistries   Recent Labs Lab 12/11/14 2151 12/12/14 0235 12/13/14 0340 12/16/14 0305  NA 139 139 140 136  K 4.5 4.0 3.6 4.2  CL 99 100 101 98  CO2 30 29 31 29   GLUCOSE 173* 150* 146* 104*  BUN 17 13 15 11   CREATININE 0.70 0.65 0.51 0.67    CALCIUM 8.4 8.4 7.8* 7.9*  MG  --   --  2.5  --   AST 24  --   --  27  ALT 19  --   --  53  ALKPHOS 72  --   --  72  BILITOT 0.5  --   --  0.7   ------------------------------------------------------------------------------------------------------------------ estimated creatinine clearance is 111.7 mL/min (by C-G formula based on Cr of 0.67). ------------------------------------------------------------------------------------------------------------------ No results for input(s): HGBA1C in the last 72 hours. ------------------------------------------------------------------------------------------------------------------ No results for input(s): CHOL, HDL, LDLCALC, TRIG, CHOLHDL, LDLDIRECT in the last 72 hours. ------------------------------------------------------------------------------------------------------------------ No results for input(s): TSH, T4TOTAL, T3FREE, THYROIDAB in the last 72 hours.  Invalid input(s): FREET3 ------------------------------------------------------------------------------------------------------------------ No results for input(s): VITAMINB12, FOLATE, FERRITIN, TIBC, IRON, RETICCTPCT in the last 72 hours.  Coagulation profile No results for input(s): INR, PROTIME in the last 168 hours.  No results for input(s): DDIMER in the last 72 hours.  Cardiac Enzymes No results for input(s): CKMB, TROPONINI, MYOGLOBIN in the last 168 hours.  Invalid input(s): CK ------------------------------------------------------------------------------------------------------------------ Invalid input(s): [place]  12/15/14 1240 12/15/14 1635 12/15/14 2116 12/16/14 0844 12/16/14 1205 12/16/14 1655  GLUCAP 96 134* 144* 113* 88 107*     Faviola Klare M.D. [facility] 12/17/2014, 12:21 PM  Pager: [phone]   Between 7am to 7pm - call Pager - [phone]  After 7pm go  to [email or web address] - password [email or web address]  Call night coverage person covering  after 7pm

## 2014-12-17 NOTE — Progress Notes (Signed)
Neither I nor the Nurse were able to locate this patient's Dulera. I left a note for pharmacy.

## 2014-12-17 NOTE — Progress Notes (Addendum)
      [address]       ,[place] [id number]             [phone]      Subjective:  Jason Ballard complains of pain at chest tube site.  Objective: Vital signs in last 24 hours: Temp:  [98 F (36.7 C)-99.8 F (37.7 C)] 98.6 F (37 C) (04/19 0412) Pulse Rate:  [76-105] 77 (04/19 0412) Cardiac Rhythm:  [-] Normal sinus rhythm;Sinus tachycardia (04/19 0746) Resp:  [17-26] 18 (04/19 0412) BP: (108-133)/(58-76) 108/58 mmHg (04/19 0412) SpO2:  [93 %-96 %] 94 % (04/19 0412)  Intake/Output from previous day: 04/18 0701 - 04/19 0700 In: 360 [P.O.:360] Out: 1051 [Urine:1000; Stool:1; Chest Tube:50]  General appearance: alert, cooperative and no distress Heart: regular rate and rhythm Lungs: diminished breath sounds bibasilar, some expiratory wheezing Wound: clean and dry  Lab Results:  Recent Labs  12/16/14 0305 12/17/14 0550  WBC 24.2* 38.3*  HGB 14.4 15.7  HCT 43.7 45.9  PLT 336 PLATELET CLUMPS NOTED ON SMEAR, COUNT APPEARS ADEQUATE   BMET:  Recent Labs  12/16/14 0305  NA 136  K 4.2  CL 98  CO2 29  GLUCOSE 104*  BUN 11  CREATININE 0.67  CALCIUM 7.9*    PT/INR: No results for input(s): LABPROT, INR in the last 72 hours. ABG No results found for: PHART, HCO3, TCO2, ACIDBASEDEF, O2SAT CBG (last 3)   Recent Labs  12/16/14 0844 12/16/14 1205 12/16/14 1655  GLUCAP 113* 88 107*   Assessment/Plan:  1. Chest tube- no air leak present, 50 ml output yesterday leave chest tube for now 2. Pain control- patient is chronic narcotic user at home, may benefit from restarting home pain medications for control 3. Dispo- care pre primary    LOS: 6 days    Jason Ballard 12/17/2014   Chart reviewed, patient examined, agree with above. He is afebrile but WBC count is rising to 38K today. He is coughing but not bringing up much sputum.  Will repeat chest CT to assess for undrained infection. Continue chest tube to water seal. I don't see a  definite air leak but there is tidaling.

## 2014-12-18 ENCOUNTER — Inpatient Hospital Stay (HOSPITAL_COMMUNITY): Payer: Medicare Other

## 2014-12-18 ENCOUNTER — Encounter (HOSPITAL_COMMUNITY): Payer: Self-pay | Admitting: Cardiology

## 2014-12-18 DIAGNOSIS — J869 Pyothorax without fistula: Secondary | ICD-10-CM | POA: Diagnosis present

## 2014-12-18 DIAGNOSIS — G894 Chronic pain syndrome: Secondary | ICD-10-CM | POA: Diagnosis present

## 2014-12-18 DIAGNOSIS — J984 Other disorders of lung: Secondary | ICD-10-CM

## 2014-12-18 DIAGNOSIS — J189 Pneumonia, unspecified organism: Secondary | ICD-10-CM

## 2014-12-18 LAB — BLOOD GAS, ARTERIAL
Acid-Base Excess: 6.1 mmol/L — ABNORMAL HIGH (ref 0.0–2.0)
Bicarbonate: 30.7 mEq/L — ABNORMAL HIGH (ref 20.0–24.0)
Drawn by: 41308
FIO2: 0.21 %
O2 Saturation: 88.6 %
Patient temperature: 98.6
TCO2: 32.2 mmol/L (ref 0–100)
pCO2 arterial: 49.8 mmHg — ABNORMAL HIGH (ref 35.0–45.0)
pH, Arterial: 7.406 (ref 7.350–7.450)
pO2, Arterial: 57.3 mmHg — ABNORMAL LOW (ref 80.0–100.0)

## 2014-12-18 LAB — PREPARE RBC (CROSSMATCH)

## 2014-12-18 LAB — COMPREHENSIVE METABOLIC PANEL
ALT: 41 U/L (ref 0–53)
AST: 39 U/L — ABNORMAL HIGH (ref 0–37)
Albumin: 2.1 g/dL — ABNORMAL LOW (ref 3.5–5.2)
Alkaline Phosphatase: 88 U/L (ref 39–117)
Anion gap: 9 (ref 5–15)
BUN: 13 mg/dL (ref 6–23)
CO2: 29 mmol/L (ref 19–32)
Calcium: 8 mg/dL — ABNORMAL LOW (ref 8.4–10.5)
Chloride: 92 mmol/L — ABNORMAL LOW (ref 96–112)
Creatinine, Ser: 0.8 mg/dL (ref 0.50–1.35)
GFR calc Af Amer: >90 mL/min (ref >90)
GFR calc non Af Amer: >90 mL/min (ref >90)
Glucose, Bld: 104 mg/dL — ABNORMAL HIGH (ref 70–99)
Potassium: 4.9 mmol/L (ref 3.5–5.1)
Sodium: 130 mmol/L — ABNORMAL LOW (ref 135–145)
Total Bilirubin: 1.2 mg/dL (ref 0.3–1.2)
Total Protein: 6.3 g/dL (ref 6.0–8.3)

## 2014-12-18 LAB — URINALYSIS, ROUTINE W REFLEX MICROSCOPIC
Bilirubin Urine: NEGATIVE
Glucose, UA: NEGATIVE mg/dL
Hgb urine dipstick: NEGATIVE
Ketones, ur: NEGATIVE mg/dL
Leukocytes, UA: NEGATIVE
Nitrite: NEGATIVE
Protein, ur: NEGATIVE mg/dL
Specific Gravity, Urine: 1.021 (ref 1.005–1.030)
Urobilinogen, UA: 0.2 mg/dL (ref 0.0–1.0)
pH: 5 (ref 5.0–8.0)

## 2014-12-18 LAB — PROTIME-INR
INR: 1.09 (ref 0.00–1.49)
Prothrombin Time: 14.2 seconds (ref 11.6–15.2)

## 2014-12-18 LAB — CBC
HCT: 42.7 % (ref 39.0–52.0)
Hemoglobin: 14 g/dL (ref 13.0–17.0)
MCH: 30.2 pg (ref 26.0–34.0)
MCHC: 32.8 g/dL (ref 30.0–36.0)
MCV: 92 fL (ref 78.0–100.0)
Platelets: 330 10*3/uL (ref 150–400)
RBC: 4.64 MIL/uL (ref 4.22–5.81)
RDW: 14 % (ref 11.5–15.5)
WBC: 16.7 10*3/uL — ABNORMAL HIGH (ref 4.0–10.5)

## 2014-12-18 LAB — VANCOMYCIN, TROUGH: Vancomycin Tr: 11 ug/mL (ref 10.0–20.0)

## 2014-12-18 LAB — APTT: aPTT: 35 seconds (ref 24–37)

## 2014-12-18 MED ORDER — OXYCODONE HCL ER 15 MG PO T12A
15.0000 mg | EXTENDED_RELEASE_TABLET | Freq: Two times a day (BID) | ORAL | Status: DC
  Administered 2014-12-18 (×2): 15 mg via ORAL
  Filled 2014-12-18 (×2): qty 1

## 2014-12-18 MED ORDER — SODIUM CHLORIDE 0.9 % IV SOLN
1250.0000 mg | INTRAVENOUS | Status: AC
  Administered 2014-12-18: 1250 mg via INTRAVENOUS
  Filled 2014-12-18: qty 1250

## 2014-12-18 MED ORDER — DEXTROSE 5 % IV SOLN
1.5000 g | INTRAVENOUS | Status: AC
  Administered 2014-12-19: 1.5 g via INTRAVENOUS
  Filled 2014-12-18: qty 1.5

## 2014-12-18 MED ORDER — HYDROMORPHONE HCL 1 MG/ML IJ SOLN
2.0000 mg | INTRAMUSCULAR | Status: DC | PRN
  Administered 2014-12-18 – 2014-12-19 (×7): 2 mg via INTRAVENOUS
  Filled 2014-12-18 (×6): qty 2

## 2014-12-18 MED ORDER — PIPERACILLIN-TAZOBACTAM 3.375 G IVPB
3.3750 g | Freq: Three times a day (TID) | INTRAVENOUS | Status: DC
  Administered 2014-12-18 – 2014-12-23 (×14): 3.375 g via INTRAVENOUS
  Filled 2014-12-18 (×20): qty 50

## 2014-12-18 MED ORDER — VANCOMYCIN HCL 10 G IV SOLR
1250.0000 mg | Freq: Three times a day (TID) | INTRAVENOUS | Status: DC
  Administered 2014-12-19 – 2014-12-23 (×12): 1250 mg via INTRAVENOUS
  Filled 2014-12-18 (×16): qty 1250

## 2014-12-18 NOTE — Progress Notes (Signed)
[facility]                                                                              Patient Demographics  Jason Ballard, is a 49 y.o. male, DOB - 1966-03-28, KKO:[id number]  Admit date - 12/11/2014   Admitting Physician Juanito Doom, MD  Outpatient Primary MD for the patient is HAGUE, Rosalyn Charters, MD  LOS - 7    chief complaint : Shortness of breath      Brief HPI   Patient is a 49 year old male with history of COPD, asthma, presented from [facility] with acute respiratory failure. He was admitted to [facility] on 4/10 with a right middle lobe pneumonia. Patient was treated with Zithromax and Rocephin. CT chest revealed a cavitating pneumonia and subsequently patient developed pneumothorax. Chest tube was placed on 4/11 and patient was placed on BiPAP while at the OSH which caused a chest tube leak and subsequent subcutaneous emphysema. Sputum culture and blood cultures obtained at the OSH were all confimed negative on 4/13. Patient was transferred to [facility] for right chest tube and possible VATS.    Assessment & Plan    Principal Problems: Acute on chronic respiratory failure due to severe cavitary community-acquired pneumonia, right upper lung cavitary pneumonia - Patient now spiking fevers, Tmax 101.7 this morning - change to IV vancomycin and Zosyn - CT angios of the chest showed large multiloculated thick-walled cavitary mass with a moderate to large probably loculated pleural effusion posteriorly with which the right chest tube does not communicate. Possibility of broncho-pleural fistula. - CT surgery following, recommending posterolateral thoracotomy to drain the empyema in the right chest is not drained effectively percutaneously.  Active problems  Pneumothorax Chest tube management and further planning of possible posterolateral thoracotomy per TCTS  COPD currently improving, no active wheezing  Hypertension controlled at  this time   Chronic benzodiazepine use Continue low dose xanax to avoid withdrawal  Chronic pain issues- states pain is not well controlled, patient is on oral Dilaudid and oxycodone at home chronically likely causing the narcotic tolerance I have increased IV Dilaudid to 2 mg every 2 hours as needed, continue OxyIR for breakthrough pain, placed on OxyContin sustained release 15 mg twice a day   Code Status: full code  Family Communication: Discussed in detail with the patient, all imaging results, lab results explained to the patient   Disposition Plan: No disposition plans until chest tubes still on  Time Spent in minutes   25 minutes  Procedures  Chest tube   Consults   Cardiothoracic surgery  DVT Prophylaxis   heparin Medications  Scheduled Meds: . ALPRAZolam  0.5 mg Oral TID  . antiseptic oral rinse  7 mL Mouth Rinse BID  . benzonatate  100 mg Oral TID  . ceFEPime (MAXIPIME) IV  1 g Intravenous Q8H  . heparin  5,000 Units Subcutaneous 3 times per day  . mometasone-formoterol  2 puff Inhalation BID  . pantoprazole  40 mg Oral Daily  . roflumilast  500 mcg Oral Daily  . Umeclidinium Bromide  1 puff Inhalation Q1200  . vancomycin  1,000 mg  Intravenous Q8H   Continuous Infusions:  PRN Meds:.acetaminophen **OR** acetaminophen, guaiFENesin-codeine, HYDROmorphone (DILAUDID) injection, ipratropium-albuterol, oxyCODONE   Antibiotics   Anti-infectives    Start     Dose/Rate Route Frequency Ordered Stop   12/16/14 1600  vancomycin (VANCOCIN) IVPB 1000 mg/200 mL premix     1,000 mg 200 mL/hr over 60 Minutes Intravenous Every 8 hours 12/16/14 1508     12/16/14 1500  ceFEPIme (MAXIPIME) 1 g in dextrose 5 % 50 mL IVPB     1 g 100 mL/hr over 30 Minutes Intravenous Every 8 hours 12/16/14 1430 12/24/14 1459   12/13/14 2200  azithromycin (ZITHROMAX) tablet 250 mg     250 mg Oral Daily at bedtime 12/13/14 0821 12/15/14 2232   12/11/14 2100  azithromycin (ZITHROMAX) 250 mg in  dextrose 5 % 125 mL IVPB  Status:  Discontinued     250 mg 125 mL/hr over 60 Minutes Intravenous Every 24 hours 12/11/14 2024 12/13/14 0821   12/11/14 2045  cefTRIAXone (ROCEPHIN) 1 g in dextrose 5 % 50 mL IVPB - Premix  Status:  Discontinued     1 g 100 mL/hr over 30 Minutes Intravenous Every 24 hours 12/11/14 2031 12/16/14 1427        Subjective:   Jason Ballard was seen and examined today. Patient denies dizziness, abdominal pain, N/V/D/C, new weakness, numbess, tingling. Starting to spike fevers today, complaining of chest pain and chronic pain not well controlled.  Objective:   Blood pressure 122/58, pulse 109, temperature 100.4 F (38 C), temperature source Oral, resp. rate 20, height 5\' 9"  (1.753 m), weight 74.5 kg (164 lb 3.9 oz), SpO2 93 %.  Wt Readings from Last 3 Encounters:  12/18/14 74.5 kg (164 lb 3.9 oz)     Intake/Output Summary (Last 24 hours) at 12/18/14 1419 Last data filed at 12/18/14 0800  Gross per 24 hour  Intake    360 ml  Output   1018 ml  Net   -658 ml    Exam  General: Alert and oriented x 3, somewhat uncomfortable due to chest tube, no visible acute pain  HEENT:  PERRLA, EOMI, Anicteic Sclera, mucous membranes moist.   Neck: Supple, no JVD, no masses  CVS: S1 and S2 clear, regular rate and rhythm  Respiratory :Left basilar crackles, chest tube  Extremities: no cyanosis clubbing or edema lower extremities bilaterally  Neuro: AAOx3, Cr N's II- XII. Strength 5/5 upper and lower extremities bilaterally  Skin: No rashes  Psych: Normal affect and demeanor, alert and oriented x3    Data Review   Micro Results Recent Results (from the past 240 hour(s))  MRSA PCR Screening     Status: None   Collection Time: 12/11/14  9:49 PM  Result Value Ref Range Status   MRSA by PCR NEGATIVE NEGATIVE Final    Comment:        The GeneXpert MRSA Assay (FDA approved for NASAL specimens only), is one component of a comprehensive MRSA  colonization surveillance program. It is not intended to diagnose MRSA infection nor to guide or monitor treatment for MRSA infections.   Culture, blood (routine x 2)     Status: None (Preliminary result)   Collection Time: 12/17/14  1:35 PM  Result Value Ref Range Status   Specimen Description BLOOD LEFT ARM  Final   Special Requests BOTTLES DRAWN AEROBIC ONLY 2CC  Final   Culture   Final           BLOOD CULTURE RECEIVED  NO GROWTH TO DATE CULTURE WILL BE HELD FOR 5 DAYS BEFORE ISSUING A FINAL NEGATIVE REPORT Note: Culture results may be compromised due to an inadequate volume of blood received in culture bottles. Performed at [facility]    Report Status PENDING  Incomplete  Culture, blood (routine x 2)     Status: None (Preliminary result)   Collection Time: 12/17/14  1:45 PM  Result Value Ref Range Status   Specimen Description BLOOD BLOOD LEFT WRIST  Final   Special Requests BOTTLES DRAWN AEROBIC ONLY 3CC  Final   Culture   Final           BLOOD CULTURE RECEIVED NO GROWTH TO DATE CULTURE WILL BE HELD FOR 5 DAYS BEFORE ISSUING A FINAL NEGATIVE REPORT Note: Culture results may be compromised due to an inadequate volume of blood received in culture bottles. Performed at [facility]    Report Status PENDING  Incomplete    Radiology Reports Dg Chest 2 View  12/17/2014   CLINICAL DATA:  49 year old male with shortness of breath and cough. Cavitary right upper lobe pneumonia. Right chest tube. Initial encounter.  EXAM: CHEST  2 VIEW  COMPARISON:  12/16/2014 and earlier.  FINDINGS: Right chest tube in place with moderate volume right chest wall and neck subcutaneous emphysema. Small right pneumothorax, pleural edge now visible in the apex.  Since 12/09/2014 mildly regressed right upper lobe airspace opacity, but increased right lung base opacity. Right pleural effusion evident on 12/11/2014 CT. The left lung is stable and clear. Stable cardiac size and mediastinal  contours. Visualized tracheal air column is within normal limits. No acute osseous abnormality identified.  IMPRESSION: 1. Stable right chest tube with moderate volume right chest wall and neck subcutaneous emphysema. 2. Recurrent small right apical pneumothorax. 3. Mildly regressed right upper lobe pneumonia since 12/09/2014, but worsening right lung base opacity in part due to pleural effusion.   Electronically Signed   By: Genevie Ann M.D.   On: 12/17/2014 08:04   Ct Angio Chest Pe W/cm &/or Wo Cm  12/17/2014   CLINICAL DATA:  Patient has history of COPD, asthma, presented from [facility] with acute respiratory failure. He was admitted to [facility] on 4/10 with a right middle lobe pneumonia. with Zithromax and cT chest revealed a cavitating pneumonia and subsequently patient developed pneumothorax. Chest tube was placed on 4/11 and patient was placed on BiPAP  EXAM: CT ANGIOGRAPHY CHEST WITH CONTRAST  TECHNIQUE: Multidetector CT imaging of the chest was performed using the standard protocol during bolus administration of intravenous contrast. Multiplanar CT image reconstructions and MIPs were obtained to evaluate the vascular anatomy.  CONTRAST:  7mL OMNIPAQUE IOHEXOL 350 MG/ML SOLN  COMPARISON:  Numerous recent prior studies  FINDINGS: No filling defects in the pulmonary arterial system. Thoracic aorta shows no dissection or dilatation. No significant hilar or mediastinal adenopathy. Mild reactive adenopathy is present.  Thoracic inlet is normal. Trace fluid in the pericardium present. No left effusion. On the right, there is a pleural effusion with numerous air-fluid levels within it suggesting infection. Numerous air-fluid levels are seen at various heights within the moderate to large right pleural effusion.  Left lung is clear. On the right, there is a cavitating mass posteriorly in the inferior aspect of the right upper lobe. There are numerous air-filled cavitary areas within this mass, with  its base along the posterior fissure. The mass measures 76 by 42 by 59 mm. There is less airspace infiltrate  surrounding the thick-walled cavitary mass currently.  There is a right chest to which enters the thorax anterior laterally in the right middle lobe and then move cranially to terminate within the pleural space near the apex. There is a large volume of subcutaneous emphysema in the right thorax. Other than the air contained within the pleural effusion, there is no significant pneumothorax.  Probable flash fill hemangioma right liver stable. No compression deformities.  Review of the MIP images confirms the above findings.  IMPRESSION: When compared to the prior CT scan performed 12/11/2014, the volume infiltrate throughout posterior segment of the right upper lobe has decreased, but the previous pneumonia is now largely replaced by a large multiloculated thick walled cavitary mass. Adjacent to this there is a moderate to large probably loculated pleural effusion posteriorly, with which the right chest tube does not communicate. Numerous pockets of gas within the effusion suggest empyema and possibility of bronchopleural fistula.  Large volume subcutaneous emphysema on the right has increased.   Electronically Signed   By: Skipper Cliche M.D.   On: 12/17/2014 16:24   Dg Chest Port 1 View  12/18/2014   CLINICAL DATA:  Known right-sided pneumothorax with chest tube treatment, suspected empyema and bronchopleural fistula  EXAM: PORTABLE CHEST - 1 VIEW  COMPARISON:  PA and lateral chest x-ray and chest CT scan of December 17, 2014  FINDINGS: The left lung is well-expanded and clear. On the right there is persistent increased density in the perihilar and infrahilar region. No pneumothorax is visible today. The right chest tube tip projects over the lateral aspect of the second rib. Considerable subcutaneous emphysema on the right persists. There is no mediastinal shift. The cardiac silhouette and pulmonary  vascularity are normal.  IMPRESSION: The small right apical pneumothorax is not visible today. There is persistent density in the right hemi thorax consistent with known empyema.   Electronically Signed   By: David  Martinique M.D.   On: 12/18/2014 07:31   Dg Chest Port 1 View  12/16/2014   CLINICAL DATA:  Pneumothorax follow-up, acute and chronic respiratory failure, long history of tobacco use now discontinued.  EXAM: PORTABLE CHEST - 1 VIEW  COMPARISON:  Portable chest x-ray of December 15, 2014  FINDINGS: There remains a large amount of subcutaneous emphysema in the right axillary region and base of the neck. Two right-sided chest tubes are present and unchanged in position. No pneumothorax is visible. Confluent alveolar opacities persist in the right perihilar region with areas of lucency consistent with known cavitation. Increased right infrahilar alveolar opacities are noted as well. There is no significant pleural effusion. There is no mediastinal shift. The left lung is clear. The heart and pulmonary vascularity are normal.  IMPRESSION: 1. No pneumothorax is visible today. The chest tubes are unchanged in position. 2. Known cavitary pneumonia on the right, unchanged. Increasing right basilar airspace disease.   Electronically Signed   By: David  Martinique   On: 12/16/2014 07:26   Dg Chest Port 1 View  12/15/2014   CLINICAL DATA:  Pneumothorax  EXAM: PORTABLE CHEST - 1 VIEW  COMPARISON:  12/14/2014  FINDINGS: Unchanged positioning of the right-sided chest tube. A trace right apical pneumothorax is present, 5% or less. Extensive right chest wall emphysema is stable.  The cavitary pneumonia in the right upper lobe it is again noted. Clear left chest. Normal heart size.  IMPRESSION: 1. Trace right apical pneumothorax with unchanged chest tube positioning. 2. Cavitary right upper lobe pneumonia.  Electronically Signed   By: Monte Fantasia M.D.   On: 12/15/2014 06:08   Dg Chest Port 1 View  12/14/2014   CLINICAL  DATA:  Status post chest tube placement  EXAM: PORTABLE CHEST - 1 VIEW  COMPARISON:  12/13/2014  FINDINGS: Cardiac shadow is within normal limits. The left lung is clear. A right-sided chest tube is again identified. No definitive pneumothorax is seen. Considerable subcutaneous emphysema is again noted. Mild right upper lobe cavitary lesion is again identified and stable.  IMPRESSION: No right-sided pneumothorax. The overall appearance is stable from the prior exam.   Electronically Signed   By: Inez Catalina M.D.   On: 12/14/2014 07:14   Dg Chest Port 1 View  12/13/2014   CLINICAL DATA:  Right pneumothorax.  Chest tube.  EXAM: PORTABLE CHEST - 1 VIEW  COMPARISON:  12/12/2014  FINDINGS: The right pneumothorax has resolved. Decreased subcutaneous emphysema on the right. Chest tube remains in good position. Improving cavitary infiltrate in the right upper lobe. Slight progression of new atelectasis at the left lung base laterally. Heart size and pulmonary vascularity are normal.  IMPRESSION: 1. Resolution of right pneumothorax. 2. Improving cavitary infiltrate in the right upper lobe. 3. Slightly increasing left base atelectasis laterally.   Electronically Signed   By: Lorriane Shire M.D.   On: 12/13/2014 07:52   Portable Chest 1 View  12/12/2014   CLINICAL DATA:  Followup pneumonia.  EXAM: PORTABLE CHEST - 1 VIEW  COMPARISON:  12/11/2014  FINDINGS: Since the previous day's study, a small right pneumothorax has become apparent, estimated at 20%. Right-sided chest tube is stable. Significant right-sided subcutaneous emphysema is also without substantial change.  Airspace consolidation in right upper lobe extending from the right hilum is similar to the prior exam. Reticular opacities are noted in both lower lungs, increased from the previous day's study, likely atelectasis.  No left pneumothorax.  No convincing pleural effusion.  IMPRESSION: 1. Approximately 20% right-sided pneumothorax, new from the previous  day's study. 2. Stable right upper lobe consolidation. 3. Mild increase in lung base reticular opacity most likely atelectasis. No other change.   Electronically Signed   By: Lajean Manes M.D.   On: 12/12/2014 08:02   Dg Chest Port 1 View  12/11/2014   CLINICAL DATA:  Pneumonia.  COPD.  EXAM: PORTABLE CHEST - 1 VIEW  COMPARISON:  12/11/2014.  FINDINGS: The right-sided chest tube is stable. Interval worsening of subcutaneous emphysema suggesting an air leak. No obvious right-sided pneumothorax. Persistent right mid lung infiltrate. The left lung is clear.  IMPRESSION: Stable chest tube but progressive subcutaneous emphysema on the right side. No obvious pneumothorax.   Electronically Signed   By: Marijo Sanes M.D.   On: 12/11/2014 19:30    CBC  Recent Labs Lab 12/11/14 2151 12/12/14 0235 12/13/14 0340 12/16/14 0305 12/17/14 0550  WBC 20.3* 21.2* 14.6* 24.2* 38.3*  HGB 12.7* 12.6* 11.3* 14.4 15.7  HCT 37.9* 37.9* 34.2* 43.7 45.9  PLT 272 310 307 336 PLATELET CLUMPS NOTED ON SMEAR, COUNT APPEARS ADEQUATE  MCV 90.2 90.7 90.0 90.7 90.5  MCH 30.2 30.1 29.7 29.9 31.0  MCHC 33.5 33.2 33.0 33.0 34.2  RDW 13.0 13.0 13.1 13.5 13.8  LYMPHSABS 0.8  --   --   --   --   MONOABS 0.5  --   --   --   --   EOSABS 0.0  --   --   --   --   BASOSABS  0.0  --   --   --   --     Chemistries   Recent Labs Lab 12/11/14 2151 12/12/14 0235 12/13/14 0340 12/16/14 0305  NA 139 139 140 136  K 4.5 4.0 3.6 4.2  CL 99 100 101 98  CO2 30 29 31 29   GLUCOSE 173* 150* 146* 104*  BUN 17 13 15 11   CREATININE 0.70 0.65 0.51 0.67  CALCIUM 8.4 8.4 7.8* 7.9*  MG  --   --  2.5  --   AST 24  --   --  27  ALT 19  --   --  53  ALKPHOS 72  --   --  72  BILITOT 0.5  --   --  0.7   ------------------------------------------------------------------------------------------------------------------ estimated creatinine clearance is 111.7 mL/min (by C-G formula based on Cr of  0.67). ------------------------------------------------------------------------------------------------------------------ No results for input(s): HGBA1C in the last 72 hours. ------------------------------------------------------------------------------------------------------------------ No results for input(s): CHOL, HDL, LDLCALC, TRIG, CHOLHDL, LDLDIRECT in the last 72 hours. ------------------------------------------------------------------------------------------------------------------ No results for input(s): TSH, T4TOTAL, T3FREE, THYROIDAB in the last 72 hours.  Invalid input(s): FREET3 ------------------------------------------------------------------------------------------------------------------ No results for input(s): VITAMINB12, FOLATE, FERRITIN, TIBC, IRON, RETICCTPCT in the last 72 hours.  Coagulation profile No results for input(s): INR, PROTIME in the last 168 hours.  No results for input(s): DDIMER in the last 72 hours.  Cardiac Enzymes No results for input(s): CKMB, TROPONINI, MYOGLOBIN in the last 168 hours.  Invalid input(s): CK ------------------------------------------------------------------------------------------------------------------ Invalid input(s): [place]  12/15/14 1635 12/15/14 2116 12/16/14 0844 12/16/14 1205 12/16/14 1655  GLUCAP 134* 144* 113* 88 107*     Babygirl Trager M.D. [facility] 12/18/2014, 2:19 PM  Pager: [phone]   Between 7am to 7pm - call Pager - [phone]  After 7pm go to [email or web address] - password [email or web address]  Call night coverage person covering after 7pm

## 2014-12-18 NOTE — Progress Notes (Signed)
CTsurgery  Patient examined and CT chest reviewed Patient discussed with Dr Cyndia Bent Plan R VATS and drainage of empyema tomorrow

## 2014-12-18 NOTE — Progress Notes (Signed)
ANTIBIOTIC CONSULT NOTE  Pharmacy Consult for Vancomycin and Zosyn Indication: pneumonia/empyema  No Known Allergies  Patient Measurements: Height: 5\' 9"  (175.3 cm) Weight: 164 lb 3.9 oz (74.5 kg) IBW/kg (Calculated) : 70.7  Vital Signs: Temp: 100.1 F (37.8 C) (04/20 1436) Temp Source: Oral (04/20 1436) BP: 127/64 mmHg (04/20 1436) Pulse Rate: 94 (04/20 1436) Intake/Output from previous day: 04/19 0701 - 04/20 0700 In: 600 [P.O.:600] Out: 1568 [Urine:1550; Chest Tube:18] Intake/Output from this shift: Total I/O In: 240 [P.O.:240] Out: -   Labs:  Recent Labs  12/16/14 0305 12/17/14 0550  WBC 24.2* 38.3*  HGB 14.4 15.7  PLT 336 PLATELET CLUMPS NOTED ON SMEAR, COUNT APPEARS ADEQUATE  CREATININE 0.67  --    Estimated Creatinine Clearance: 111.7 mL/min (by C-G formula based on Cr of 0.67). No results for input(s): VANCOTROUGH, VANCOPEAK, VANCORANDOM, GENTTROUGH, GENTPEAK, GENTRANDOM, TOBRATROUGH, TOBRAPEAK, TOBRARND, AMIKACINPEAK, AMIKACINTROU, AMIKACIN in the last 72 hours.   Microbiology: Recent Results (from the past 720 hour(s))  MRSA PCR Screening     Status: None   Collection Time: 12/11/14  9:49 PM  Result Value Ref Range Status   MRSA by PCR NEGATIVE NEGATIVE Final    Comment:        The GeneXpert MRSA Assay (FDA approved for NASAL specimens only), is one component of a comprehensive MRSA colonization surveillance program. It is not intended to diagnose MRSA infection nor to guide or monitor treatment for MRSA infections.   Culture, blood (routine x 2)     Status: None (Preliminary result)   Collection Time: 12/17/14  1:35 PM  Result Value Ref Range Status   Specimen Description BLOOD LEFT ARM  Final   Special Requests BOTTLES DRAWN AEROBIC ONLY 2CC  Final   Culture   Final           BLOOD CULTURE RECEIVED NO GROWTH TO DATE CULTURE WILL BE HELD FOR 5 DAYS BEFORE ISSUING A FINAL NEGATIVE REPORT Note: Culture results may be compromised due to an  inadequate volume of blood received in culture bottles. Performed at [facility]    Report Status PENDING  Incomplete  Culture, blood (routine x 2)     Status: None (Preliminary result)   Collection Time: 12/17/14  1:45 PM  Result Value Ref Range Status   Specimen Description BLOOD BLOOD LEFT WRIST  Final   Special Requests BOTTLES DRAWN AEROBIC ONLY 3CC  Final   Culture   Final           BLOOD CULTURE RECEIVED NO GROWTH TO DATE CULTURE WILL BE HELD FOR 5 DAYS BEFORE ISSUING A FINAL NEGATIVE REPORT Note: Culture results may be compromised due to an inadequate volume of blood received in culture bottles. Performed at [facility]    Report Status PENDING  Incomplete    Assessment: 49 y.o. male presents from [facility] on 4/13 for management of R chest tube. Pt was on abx (Rocephin and Azithromycin) for RUL cavitary PNA. Antibiotics were broadened to Vancomycin and Cefepime to provide HCAP coverage with newly developed LLL infiltrate on CXR. Today, decision made to switch cefepime to Zosyn.  SCr 0.67, est CrCl > 100 ml/min. WBC elevated at 38.3, tmax/24h 101.7.  Note: a vancomycin trough is being drawn this afternoon.  Goal of Therapy:  Vancomycin trough level 15-20 mcg/ml  Plan:  -Vancomycin 1gm IV q8h -Zoysn 3.37g IV q8h EI -f/u micro data, pt's clinical condition, and renal function -f/u results of vancomycin trough later today  Keeon Zurn D. Tanetta Fuhriman, PharmD, BCPS Clinical Pharmacist Pager: [phone] 12/18/2014 2:45 PM

## 2014-12-18 NOTE — Progress Notes (Signed)
ANTIBIOTIC CONSULT NOTE - FOLLOW UP  Pharmacy Consult for Vancomycin Indication: Cavitary PNA  No Known Allergies  Patient Measurements: Height: 5\' 9"  (175.3 cm) Weight: 164 lb 3.9 oz (74.5 kg) IBW/kg (Calculated) : 70.7 Adjusted Body Weight:    Vital Signs: Temp: 100.1 F (37.8 C) (04/20 1436) Temp Source: Oral (04/20 1436) BP: 127/64 mmHg (04/20 1436) Pulse Rate: 94 (04/20 1436) Intake/Output from previous day: 04/19 0701 - 04/20 0700 In: 600 [P.O.:600] Out: 1568 [Urine:1550; Chest Tube:18] Intake/Output from this shift: Total I/O In: 240 [P.O.:240] Out: -   Labs:  Recent Labs  12/16/14 0305 12/17/14 0550  WBC 24.2* 38.3*  HGB 14.4 15.7  PLT 336 PLATELET CLUMPS NOTED ON SMEAR, COUNT APPEARS ADEQUATE  CREATININE 0.67  --    Estimated Creatinine Clearance: 111.7 mL/min (by C-G formula based on Cr of 0.67).  Recent Labs  12/18/14 1634  VANCOTROUGH 11.0     Assessment: 49 y.o. male presents from [facility] on [facility]/13 for management of R chest tube. Pt was on abx (Rocephin and Azithromycin) for RUL cavitary PNA. Antibiotics were broadened to Vancomycin and Cefepime to provide HCAP coverage with newly developed LLL infiltrate on CXR. Today, decision made to switch cefepime to Zosyn. SCr 0.67, est CrCl > 100 ml/min. WBC elevated at 38.3, tmax/24h 101.7. Vancomycin trough low at 11. Doses have not been charted at appropriate 8 hrs intervals.  4/19 Bld x2 - NGTD  azith 4/13>>4/17 ctx 4/13>>4/18 Vanc 4/18>> Cefepime 4/18>>4/20 Zosyn 4/20>>  Goal of Therapy:  Vancomycin trough level 15-20 mcg/ml  Plan:  Increase Vancomycin to 1250mg  IV q8 hrs  Seamus Warehime S. Alford Highland, PharmD, BCPS Clinical Staff Pharmacist Pager [phone]  Eilene Ghazi Stillinger 12/18/2014,5:46 PM

## 2014-12-18 NOTE — Progress Notes (Addendum)
      [address]       [place] [address]             [phone]      Subjective:  Mr. Krontz continues to complain of incisional pain.  He uses narcotics chronically at home.  Objective: Vital signs in last 24 hours: Temp:  [98.4 F (36.9 C)-101.7 F (38.7 C)] 100.4 F (38 C) (04/20 0654) Pulse Rate:  [105-109] 109 (04/20 0538) Cardiac Rhythm:  [-] Normal sinus rhythm;Sinus tachycardia (04/20 0736) Resp:  [18-20] 20 (04/20 0538) BP: (122-139)/(58-74) 122/58 mmHg (04/20 0538) SpO2:  [92 %-93 %] 93 % (04/20 0538) Weight:  [164 lb 3.9 oz (74.5 kg)] 164 lb 3.9 oz (74.5 kg) (04/20 0538)  Intake/Output from previous day: 04/19 0701 - 04/20 0700 In: 600 [P.O.:600] Out: 1568 [Urine:1550; Chest Tube:18]  General appearance: alert, cooperative and no distress Heart: regular rate and rhythm Lungs: diminished breath sounds bibasilar Abdomen: soft, non-tender; bowel sounds normal; no masses,  no organomegaly Wound: clean and dry  Lab Results:  Recent Labs  12/16/14 0305 12/17/14 0550  WBC 24.2* 38.3*  HGB 14.4 15.7  HCT 43.7 45.9  PLT 336 PLATELET CLUMPS NOTED ON SMEAR, COUNT APPEARS ADEQUATE   BMET:  Recent Labs  12/16/14 0305  NA 136  K 4.2  CL 98  CO2 29  GLUCOSE 104*  BUN 11  CREATININE 0.67  CALCIUM 7.9*    PT/INR: No results for input(s): LABPROT, INR in the last 72 hours. ABG No results found for: PHART, HCO3, TCO2, ACIDBASEDEF, O2SAT CBG (last 3)   Recent Labs  12/16/14 0844 12/16/14 1205 12/16/14 1655  GLUCAP 113* 88 107*    Assessment/Plan:  1. Chest tube- no air leak present, some tidaling, 30 cc output yesterday 2. Pulm- pneumothorax has resolved, continue IS 3. 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 Dispo- care per primary, continue chest tube for now   LOS: 7 days      BARRETT, ERIN 12/18/2014   Chart reviewed, patient examined, agree with above. He had fever to 101.7 last pm for the first time and CTA shows an enlarging empyema in the right posterior pleural space with gas bubbles in it. The infiltrate in the posterior aspect of the RUL is somewhat better but there is a large multiloculated thick walled cavitary mass in this area. He will require posterolateral thoracotomy to drain this empyema in the right chest. I don't think it can be drained effectively percutaneously. I don't think anything should be done to the lung. He has severe COPD. I discussed the surgery with him and his wife, the risks of prolonged air leak due to bronchopleural fistula and the possibility that he may continue to have problems with the RUL due to the large cavitary mass. He is not a candidate for resection of this. I discussed the risk of persistent infection, respiratory failure and prolonged mechanical ventilation. I will be out of town but Dr. Darcey Nora will do the surgery and patient is in agreement with that.

## 2014-12-19 ENCOUNTER — Inpatient Hospital Stay (HOSPITAL_COMMUNITY): Payer: Medicare Other | Admitting: Certified Registered Nurse Anesthetist

## 2014-12-19 ENCOUNTER — Encounter (HOSPITAL_COMMUNITY)
Admission: EM | Disposition: A | Discharge status: Home / Self Care | Payer: Self-pay | Source: Other Acute Inpatient Hospital | Attending: Internal Medicine

## 2014-12-19 ENCOUNTER — Encounter (HOSPITAL_COMMUNITY): Payer: Self-pay | Admitting: Certified Registered Nurse Anesthetist

## 2014-12-19 ENCOUNTER — Inpatient Hospital Stay (HOSPITAL_COMMUNITY): Payer: Medicare Other

## 2014-12-19 DIAGNOSIS — J984 Other disorders of lung: Secondary | ICD-10-CM

## 2014-12-19 DIAGNOSIS — J869 Pyothorax without fistula: Secondary | ICD-10-CM

## 2014-12-19 DIAGNOSIS — G894 Chronic pain syndrome: Secondary | ICD-10-CM

## 2014-12-19 HISTORY — PX: VIDEO ASSISTED THORACOSCOPY (VATS)/EMPYEMA: SHX6172

## 2014-12-19 LAB — BLOOD GAS, ARTERIAL
Acid-Base Excess: 4.9 mmol/L — ABNORMAL HIGH (ref 0.0–2.0)
Acid-Base Excess: 7.1 mmol/L — ABNORMAL HIGH (ref 0.0–2.0)
Bicarbonate: 31.2 mEq/L — ABNORMAL HIGH (ref 20.0–24.0)
Bicarbonate: 32.4 mEq/L — ABNORMAL HIGH (ref 20.0–24.0)
O2 Content: 8 L/min
O2 Content: 4 L/min
O2 Saturation: 97.8 %
O2 Saturation: 94 %
Patient temperature: 98.3
Patient temperature: 98.3
TCO2: 33.3 mmol/L (ref 0–100)
TCO2: 34.2 mmol/L (ref 0–100)
pCO2 arterial: 68.1 mmHg (ref 35.0–45.0)
pCO2 arterial: 58.3 mmHg (ref 35.0–45.0)
pH, Arterial: 7.282 — ABNORMAL LOW (ref 7.350–7.450)
pH, Arterial: 7.363 (ref 7.350–7.450)
pO2, Arterial: 127 mmHg — ABNORMAL HIGH (ref 80.0–100.0)
pO2, Arterial: 74.8 mmHg — ABNORMAL LOW (ref 80.0–100.0)

## 2014-12-19 LAB — ABO/RH: ABO/RH(D): B POS

## 2014-12-19 SURGERY — VIDEO ASSISTED THORACOSCOPY (VATS)/EMPYEMA
Anesthesia: General | Site: Chest | Laterality: Right

## 2014-12-19 MED ORDER — MIDAZOLAM HCL 2 MG/2ML IJ SOLN
INTRAMUSCULAR | Status: AC
  Filled 2014-12-19: qty 2

## 2014-12-19 MED ORDER — ONDANSETRON HCL 4 MG/2ML IJ SOLN: 4.0000 mg | Freq: Once | INTRAMUSCULAR | Status: DC | PRN

## 2014-12-19 MED ORDER — HYDROMORPHONE HCL 1 MG/ML IJ SOLN
0.2500 mg | INTRAMUSCULAR | Status: DC | PRN
  Administered 2014-12-19 (×2): 0.5 mg via INTRAVENOUS

## 2014-12-19 MED ORDER — MIDAZOLAM HCL 5 MG/5ML IJ SOLN
INTRAMUSCULAR | Status: DC | PRN
  Administered 2014-12-19: 2 mg via INTRAVENOUS
  Administered 2014-12-19 (×2): 1 mg via INTRAVENOUS

## 2014-12-19 MED ORDER — DEXTROSE-NACL 5-0.45 % IV SOLN
INTRAVENOUS | Status: DC
  Administered 2014-12-19: 22:00:00 via INTRAVENOUS
  Filled 2014-12-19: qty 1000

## 2014-12-19 MED ORDER — BUPIVACAINE 0.5 % ON-Q PUMP SINGLE CATH 400 ML
400.0000 mL | INJECTION | Status: DC
  Administered 2014-12-19: 400 mL
  Filled 2014-12-19: qty 400

## 2014-12-19 MED ORDER — VECURONIUM BROMIDE 10 MG IV SOLR
INTRAVENOUS | Status: AC
  Filled 2014-12-19: qty 10

## 2014-12-19 MED ORDER — ONDANSETRON HCL 4 MG/2ML IJ SOLN: 4.0000 mg | Freq: Four times a day (QID) | INTRAMUSCULAR | Status: DC | PRN

## 2014-12-19 MED ORDER — ACETAMINOPHEN 160 MG/5ML PO SOLN
1000.0000 mg | Freq: Four times a day (QID) | ORAL | Status: DC
Start: 2014-12-19 — End: 2014-12-24
  Administered 2014-12-23: 1000 mg via ORAL
  Filled 2014-12-19: qty 40

## 2014-12-19 MED ORDER — ONDANSETRON HCL 4 MG/2ML IJ SOLN
INTRAMUSCULAR | Status: DC | PRN
  Administered 2014-12-19: 4 mg via INTRAVENOUS

## 2014-12-19 MED ORDER — LEVALBUTEROL HCL 0.63 MG/3ML IN NEBU
0.6300 mg | INHALATION_SOLUTION | Freq: Four times a day (QID) | RESPIRATORY_TRACT | Status: DC
  Administered 2014-12-19 – 2014-12-22 (×11): 0.63 mg via RESPIRATORY_TRACT
  Filled 2014-12-19 (×18): qty 3

## 2014-12-19 MED ORDER — FENTANYL CITRATE (PF) 250 MCG/5ML IJ SOLN
INTRAMUSCULAR | Status: AC
  Filled 2014-12-19: qty 5

## 2014-12-19 MED ORDER — GLYCOPYRROLATE 0.2 MG/ML IJ SOLN
INTRAMUSCULAR | Status: AC
  Filled 2014-12-19: qty 3

## 2014-12-19 MED ORDER — NEOSTIGMINE METHYLSULFATE 10 MG/10ML IV SOLN
INTRAVENOUS | Status: DC | PRN
  Administered 2014-12-19: 4 mg via INTRAVENOUS

## 2014-12-19 MED ORDER — MEPERIDINE HCL 25 MG/ML IJ SOLN: 6.2500 mg | INTRAMUSCULAR | Status: DC | PRN

## 2014-12-19 MED ORDER — BUPIVACAINE HCL (PF) 0.5 % IJ SOLN
INTRAMUSCULAR | Status: DC | PRN
  Administered 2014-12-19: 10 mL

## 2014-12-19 MED ORDER — ALBUTEROL SULFATE HFA 108 (90 BASE) MCG/ACT IN AERS
INHALATION_SPRAY | RESPIRATORY_TRACT | Status: DC | PRN
  Administered 2014-12-19: 4 via RESPIRATORY_TRACT

## 2014-12-19 MED ORDER — ACETAMINOPHEN 500 MG PO TABS
1000.0000 mg | ORAL_TABLET | Freq: Four times a day (QID) | ORAL | Status: DC
  Administered 2014-12-19 – 2014-12-24 (×15): 1000 mg via ORAL
  Filled 2014-12-19 (×23): qty 2

## 2014-12-19 MED ORDER — ACETAMINOPHEN 10 MG/ML IV SOLN
INTRAVENOUS | Status: AC
  Administered 2014-12-19: 1000 mg
  Filled 2014-12-19: qty 100

## 2014-12-19 MED ORDER — DIPHENHYDRAMINE HCL 12.5 MG/5ML PO ELIX
12.5000 mg | ORAL_SOLUTION | Freq: Four times a day (QID) | ORAL | Status: DC | PRN
  Filled 2014-12-19: qty 5

## 2014-12-19 MED ORDER — ONDANSETRON HCL 4 MG/2ML IJ SOLN
4.0000 mg | Freq: Four times a day (QID) | INTRAMUSCULAR | Status: DC | PRN
  Filled 2014-12-19: qty 2

## 2014-12-19 MED ORDER — PROPOFOL 10 MG/ML IV BOLUS
INTRAVENOUS | Status: AC
  Filled 2014-12-19: qty 20

## 2014-12-19 MED ORDER — ROCURONIUM BROMIDE 100 MG/10ML IV SOLN
INTRAVENOUS | Status: DC | PRN
  Administered 2014-12-19: 50 mg via INTRAVENOUS

## 2014-12-19 MED ORDER — SODIUM CHLORIDE 0.9 % IJ SOLN: 9.0000 mL | INTRAMUSCULAR | Status: DC | PRN

## 2014-12-19 MED ORDER — NALOXONE HCL 0.4 MG/ML IJ SOLN
0.4000 mg | INTRAMUSCULAR | Status: DC | PRN
  Filled 2014-12-19: qty 1

## 2014-12-19 MED ORDER — SUCCINYLCHOLINE CHLORIDE 20 MG/ML IJ SOLN
INTRAMUSCULAR | Status: AC
  Filled 2014-12-19: qty 1

## 2014-12-19 MED ORDER — VECURONIUM BROMIDE 10 MG IV SOLR
INTRAVENOUS | Status: DC | PRN
  Administered 2014-12-19: 2 mg via INTRAVENOUS

## 2014-12-19 MED ORDER — 0.9 % SODIUM CHLORIDE (POUR BTL) OPTIME
TOPICAL | Status: DC | PRN
  Administered 2014-12-19: 1000 mL

## 2014-12-19 MED ORDER — KETOROLAC TROMETHAMINE 30 MG/ML IJ SOLN
30.0000 mg | Freq: Once | INTRAMUSCULAR | Status: AC
  Administered 2014-12-19: 30 mg via INTRAVENOUS

## 2014-12-19 MED ORDER — GLYCOPYRROLATE 0.2 MG/ML IJ SOLN
INTRAMUSCULAR | Status: DC | PRN
  Administered 2014-12-19: 0.6 mg via INTRAVENOUS

## 2014-12-19 MED ORDER — DIPHENHYDRAMINE HCL 50 MG/ML IJ SOLN
12.5000 mg | Freq: Four times a day (QID) | INTRAMUSCULAR | Status: DC | PRN
  Filled 2014-12-19: qty 0.25

## 2014-12-19 MED ORDER — STERILE WATER FOR INJECTION IJ SOLN
INTRAMUSCULAR | Status: AC
  Filled 2014-12-19: qty 10

## 2014-12-19 MED ORDER — BUPIVACAINE ON-Q PAIN PUMP (FOR ORDER SET NO CHG)
INJECTION | Status: AC
Start: 2014-12-19 — End: 2014-12-22
  Filled 2014-12-19: qty 1

## 2014-12-19 MED ORDER — ALBUTEROL SULFATE HFA 108 (90 BASE) MCG/ACT IN AERS
INHALATION_SPRAY | RESPIRATORY_TRACT | Status: AC
  Filled 2014-12-19: qty 6.7

## 2014-12-19 MED ORDER — BUPIVACAINE 0.5 % ON-Q PUMP SINGLE CATH 400 ML
400.0000 mL | INJECTION | Status: DC
  Filled 2014-12-19: qty 400

## 2014-12-19 MED ORDER — ROCURONIUM BROMIDE 50 MG/5ML IV SOLN
INTRAVENOUS | Status: AC
  Filled 2014-12-19: qty 1

## 2014-12-19 MED ORDER — METOCLOPRAMIDE HCL 5 MG/ML IJ SOLN
10.0000 mg | Freq: Four times a day (QID) | INTRAMUSCULAR | Status: AC
  Administered 2014-12-19 – 2014-12-20 (×4): 10 mg via INTRAVENOUS
  Filled 2014-12-19 (×4): qty 2

## 2014-12-19 MED ORDER — SENNOSIDES-DOCUSATE SODIUM 8.6-50 MG PO TABS
1.0000 | ORAL_TABLET | Freq: Every day | ORAL | Status: DC
  Administered 2014-12-19 – 2014-12-21 (×3): 1 via ORAL
  Filled 2014-12-19 (×8): qty 1

## 2014-12-19 MED ORDER — FENTANYL CITRATE (PF) 100 MCG/2ML IJ SOLN
INTRAMUSCULAR | Status: AC
  Filled 2014-12-19: qty 2

## 2014-12-19 MED ORDER — LACTATED RINGERS IV SOLN
INTRAVENOUS | Status: DC | PRN
  Administered 2014-12-19 (×2): via INTRAVENOUS

## 2014-12-19 MED ORDER — FENTANYL CITRATE (PF) 100 MCG/2ML IJ SOLN
INTRAMUSCULAR | Status: DC | PRN
  Administered 2014-12-19: 150 ug via INTRAVENOUS
  Administered 2014-12-19 (×2): 50 ug via INTRAVENOUS
  Administered 2014-12-19: 100 ug via INTRAVENOUS
  Administered 2014-12-19 (×3): 50 ug via INTRAVENOUS

## 2014-12-19 MED ORDER — ARTIFICIAL TEARS OP OINT
TOPICAL_OINTMENT | OPHTHALMIC | Status: AC
  Filled 2014-12-19: qty 3.5

## 2014-12-19 MED ORDER — GLYCOPYRROLATE 0.2 MG/ML IJ SOLN
INTRAMUSCULAR | Status: AC
  Filled 2014-12-19: qty 1

## 2014-12-19 MED ORDER — DEXTROSE 5 % IV SOLN
1.5000 g | Freq: Two times a day (BID) | INTRAVENOUS | Status: AC
  Administered 2014-12-19 – 2014-12-20 (×2): 1.5 g via INTRAVENOUS
  Filled 2014-12-19 (×2): qty 1.5

## 2014-12-19 MED ORDER — EPHEDRINE SULFATE 50 MG/ML IJ SOLN
INTRAMUSCULAR | Status: AC
  Filled 2014-12-19: qty 1

## 2014-12-19 MED ORDER — BUPIVACAINE HCL (PF) 0.5 % IJ SOLN
INTRAMUSCULAR | Status: AC
  Filled 2014-12-19: qty 10

## 2014-12-19 MED ORDER — LIDOCAINE HCL (CARDIAC) 20 MG/ML IV SOLN
INTRAVENOUS | Status: AC
  Filled 2014-12-19: qty 5

## 2014-12-19 MED ORDER — HYDROMORPHONE HCL 1 MG/ML IJ SOLN
INTRAMUSCULAR | Status: AC
  Filled 2014-12-19: qty 1

## 2014-12-19 MED ORDER — NEOSTIGMINE METHYLSULFATE 10 MG/10ML IV SOLN
INTRAVENOUS | Status: AC
  Filled 2014-12-19: qty 1

## 2014-12-19 MED ORDER — LEVALBUTEROL HCL 0.63 MG/3ML IN NEBU
INHALATION_SOLUTION | RESPIRATORY_TRACT | Status: AC
  Filled 2014-12-19: qty 3

## 2014-12-19 MED ORDER — POTASSIUM CHLORIDE 10 MEQ/50ML IV SOLN
10.0000 meq | Freq: Every day | INTRAVENOUS | Status: DC | PRN
  Filled 2014-12-19: qty 50

## 2014-12-19 MED ORDER — FENTANYL 10 MCG/ML IV SOLN
INTRAVENOUS | Status: DC
  Administered 2014-12-19: 178.6 ug via INTRAVENOUS
  Administered 2014-12-19: 13:00:00 via INTRAVENOUS
  Administered 2014-12-19: 290 ug via INTRAVENOUS
  Administered 2014-12-19: 20:00:00 via INTRAVENOUS
  Administered 2014-12-20: 150 ug via INTRAVENOUS
  Administered 2014-12-20: 240 ug via INTRAVENOUS
  Administered 2014-12-20: 105 ug via INTRAVENOUS
  Administered 2014-12-20: 120 ug via INTRAVENOUS
  Administered 2014-12-20: 20:00:00 via INTRAVENOUS
  Administered 2014-12-20: 165 ug via INTRAVENOUS
  Administered 2014-12-21: 135 ug via INTRAVENOUS
  Administered 2014-12-21: 195 ug via INTRAVENOUS
  Administered 2014-12-21: 180 ug via INTRAVENOUS
  Administered 2014-12-21: 23:00:00 via INTRAVENOUS
  Administered 2014-12-21: 255 ug via INTRAVENOUS
  Administered 2014-12-22: 16:00:00 via INTRAVENOUS
  Administered 2014-12-22: 360 ug via INTRAVENOUS
  Administered 2014-12-22: 60 ug via INTRAVENOUS
  Administered 2014-12-23: 53.72 ug via INTRAVENOUS
  Administered 2014-12-23: 135 ug via INTRAVENOUS
  Filled 2014-12-19 (×9): qty 50

## 2014-12-19 MED ORDER — BISACODYL 5 MG PO TBEC
10.0000 mg | DELAYED_RELEASE_TABLET | Freq: Every day | ORAL | Status: DC
  Administered 2014-12-19 – 2014-12-24 (×4): 10 mg via ORAL
  Filled 2014-12-19 (×5): qty 2

## 2014-12-19 MED ORDER — KETOROLAC TROMETHAMINE 30 MG/ML IJ SOLN
INTRAMUSCULAR | Status: AC
  Filled 2014-12-19: qty 1

## 2014-12-19 MED ORDER — KETOROLAC TROMETHAMINE 15 MG/ML IJ SOLN
15.0000 mg | Freq: Four times a day (QID) | INTRAMUSCULAR | Status: DC | PRN
  Administered 2014-12-19 – 2014-12-24 (×9): 15 mg via INTRAVENOUS
  Filled 2014-12-19 (×10): qty 1

## 2014-12-19 MED ORDER — DEXAMETHASONE SODIUM PHOSPHATE 4 MG/ML IJ SOLN
INTRAMUSCULAR | Status: DC | PRN
  Administered 2014-12-19: 4 mg via INTRAVENOUS

## 2014-12-19 MED ORDER — PHENYLEPHRINE HCL 10 MG/ML IJ SOLN
10.0000 mg | INTRAVENOUS | Status: DC | PRN
  Administered 2014-12-19: 20 ug/min via INTRAVENOUS

## 2014-12-19 MED ORDER — LIDOCAINE HCL (CARDIAC) 20 MG/ML IV SOLN
INTRAVENOUS | Status: DC | PRN
  Administered 2014-12-19: 100 mg via INTRAVENOUS

## 2014-12-19 SURGICAL SUPPLY — 70 items
BAG DECANTER FOR FLEXI CONT (MISCELLANEOUS) IMPLANT
BLADE SURG 11 STRL SS (BLADE) ×3 IMPLANT
CANISTER SUCTION 2500CC (MISCELLANEOUS) ×3 IMPLANT
CATH KIT ON Q 5IN SLV (PAIN MANAGEMENT) IMPLANT
CATH KIT ON-Q SILVERSOAK 5IN (CATHETERS) ×3 IMPLANT
CATH ROBINSON RED A/P 22FR (CATHETERS) IMPLANT
CATH THORACIC 28FR (CATHETERS) IMPLANT
CATH THORACIC 36FR (CATHETERS) ×3 IMPLANT
CATH THORACIC 36FR RT ANG (CATHETERS) ×3 IMPLANT
CLOSURE WOUND 1/2 X4 (GAUZE/BANDAGES/DRESSINGS) ×1
CONN ST 1/4X3/8  BEN (MISCELLANEOUS) ×2
CONN ST 1/4X3/8 BEN (MISCELLANEOUS) ×1 IMPLANT
CONN Y 3/8X3/8X3/8  BEN (MISCELLANEOUS) ×2
CONN Y 3/8X3/8X3/8 BEN (MISCELLANEOUS) ×1 IMPLANT
CONT SPEC 4OZ CLIKSEAL STRL BL (MISCELLANEOUS) ×12 IMPLANT
COVER SURGICAL LIGHT HANDLE (MISCELLANEOUS) ×3 IMPLANT
DERMABOND ADVANCED (GAUZE/BANDAGES/DRESSINGS) ×2
DERMABOND ADVANCED .7 DNX12 (GAUZE/BANDAGES/DRESSINGS) ×1 IMPLANT
DRAIN CHANNEL 32F RND 10.7 FF (WOUND CARE) ×3 IMPLANT
DRAPE LAPAROSCOPIC ABDOMINAL (DRAPES) ×3 IMPLANT
DRAPE WARM FLUID 44X44 (DRAPE) ×3 IMPLANT
ELECT BLADE 6.5 EXT (BLADE) ×3 IMPLANT
ELECT REM PT RETURN 9FT ADLT (ELECTROSURGICAL) ×3
ELECTRODE REM PT RTRN 9FT ADLT (ELECTROSURGICAL) ×1 IMPLANT
GAUZE SPONGE 4X4 12PLY STRL (GAUZE/BANDAGES/DRESSINGS) ×3 IMPLANT
GLOVE SURG SIGNA 7.5 PF LTX (GLOVE) ×6 IMPLANT
GOWN STRL REUS W/ TWL LRG LVL3 (GOWN DISPOSABLE) ×3 IMPLANT
GOWN STRL REUS W/TWL LRG LVL3 (GOWN DISPOSABLE) ×6
KIT BASIN OR (CUSTOM PROCEDURE TRAY) ×3 IMPLANT
KIT ROOM TURNOVER OR (KITS) ×3 IMPLANT
KIT SUCTION CATH 14FR (SUCTIONS) ×6 IMPLANT
NS IRRIG 1000ML POUR BTL (IV SOLUTION) ×12 IMPLANT
PACK CHEST (CUSTOM PROCEDURE TRAY) ×3 IMPLANT
PAD ARMBOARD 7.5X6 YLW CONV (MISCELLANEOUS) ×6 IMPLANT
SEALANT SURG COSEAL 4ML (VASCULAR PRODUCTS) IMPLANT
SOLUTION ANTI FOG 6CC (MISCELLANEOUS) ×3 IMPLANT
SPONGE GAUZE 4X4 12PLY STER LF (GAUZE/BANDAGES/DRESSINGS) ×3 IMPLANT
SPONGE TONSIL 1.25 RF SGL STRG (GAUZE/BANDAGES/DRESSINGS) ×3 IMPLANT
STRIP CLOSURE SKIN 1/2X4 (GAUZE/BANDAGES/DRESSINGS) ×2 IMPLANT
SUT CHROMIC 3 0 SH 27 (SUTURE) ×6 IMPLANT
SUT ETHILON 3 0 PS 1 (SUTURE) ×3 IMPLANT
SUT PROLENE 3 0 SH DA (SUTURE) ×3 IMPLANT
SUT PROLENE 4 0 RB 1 (SUTURE)
SUT PROLENE 4-0 RB1 .5 CRCL 36 (SUTURE) IMPLANT
SUT PROLENE 6 0 C 1 30 (SUTURE) IMPLANT
SUT SILK  1 MH (SUTURE) ×4
SUT SILK 1 MH (SUTURE) ×2 IMPLANT
SUT SILK 1 TIES 10X30 (SUTURE) IMPLANT
SUT SILK 2 0SH CR/8 30 (SUTURE) ×3 IMPLANT
SUT SILK 3 0SH CR/8 30 (SUTURE) ×3 IMPLANT
SUT VIC AB 1 CTX 18 (SUTURE) ×3 IMPLANT
SUT VIC AB 2 TP1 27 (SUTURE) IMPLANT
SUT VIC AB 2-0 CT2 18 VCP726D (SUTURE) IMPLANT
SUT VIC AB 2-0 CTX 36 (SUTURE) ×6 IMPLANT
SUT VIC AB 3-0 SH 18 (SUTURE) IMPLANT
SUT VIC AB 3-0 X1 27 (SUTURE) ×9 IMPLANT
SUT VICRYL 0 UR6 27IN ABS (SUTURE) IMPLANT
SUT VICRYL 2 TP 1 (SUTURE) ×3 IMPLANT
SWAB COLLECTION DEVICE MRSA (MISCELLANEOUS) IMPLANT
SYSTEM SAHARA CHEST DRAIN ATS (WOUND CARE) ×3 IMPLANT
TAPE CLOTH SURG 4X10 WHT LF (GAUZE/BANDAGES/DRESSINGS) ×3 IMPLANT
TAPE STRIPS DRAPE STRL (GAUZE/BANDAGES/DRESSINGS) ×3 IMPLANT
TIP APPLICATOR SPRAY EXTEND 16 (VASCULAR PRODUCTS) IMPLANT
TOWEL OR 17X24 6PK STRL BLUE (TOWEL DISPOSABLE) ×3 IMPLANT
TOWEL OR 17X26 10 PK STRL BLUE (TOWEL DISPOSABLE) ×6 IMPLANT
TRAP SPECIMEN MUCOUS 40CC (MISCELLANEOUS) ×3 IMPLANT
TRAY FOLEY CATH 16FRSI W/METER (SET/KITS/TRAYS/PACK) ×3 IMPLANT
TUBE ANAEROBIC SPECIMEN COL (MISCELLANEOUS) ×3 IMPLANT
TUNNELER SHEATH ON-Q 11GX8 DSP (PAIN MANAGEMENT) ×3 IMPLANT
WATER STERILE IRR 1000ML POUR (IV SOLUTION) ×6 IMPLANT

## 2014-12-19 NOTE — Anesthesia Procedure Notes (Signed)
Procedure Name: Intubation Date/Time: 12/19/2014 9:54 AM Performed by: Maryland Pink Pre-anesthesia Checklist: Patient identified, Emergency Drugs available, Suction available, Patient being monitored and Timeout performed Patient Re-evaluated:Patient Re-evaluated prior to inductionOxygen Delivery Method: Circle system utilized Preoxygenation: Pre-oxygenation with 100% oxygen Intubation Type: IV induction Ventilation: Oral airway inserted - appropriate to patient size and Mask ventilation without difficulty Laryngoscope Size: Mac and 4 Grade View: Grade I Endobronchial tube: Double lumen EBT, Left, EBT position confirmed by fiberoptic bronchoscope and EBT position confirmed by auscultation and 39 Fr Number of attempts: 1 Airway Equipment and Method: Stylet Placement Confirmation: ETT inserted through vocal cords under direct vision,  positive ETCO2 and breath sounds checked- equal and bilateral Tube secured with: Tape Dental Injury: Teeth and Oropharynx as per pre-operative assessment

## 2014-12-19 NOTE — Brief Op Note (Signed)
12/11/2014 - 12/19/2014  11:38 AM  PATIENT:  Alethia Berthold  49 y.o. male  PRE-OPERATIVE DIAGNOSIS:  EMPYEMA  POST-OPERATIVE DIAGNOSIS:  right empyema  PROCEDURE:  Procedure(s):  VIDEO ASSISTED THORACOSCOPY/right thoracotomy (VATS)/ -Drainage of Empyema -Insertion of ON-Q  SURGEON:  Surgeon(s) and Role:    * Ivin Poot, MD - Primary  PHYSICIAN ASSISTANT: Ellwood Handler PA-C  ANESTHESIA:   general  EBL:  Total I/O In: 1600 [I.V.:1600] Out: 500 [Other:200; Blood:300]  BLOOD ADMINISTERED:none  DRAINS: Blake Drain, Chest tube x 2 in right pleural space   LOCAL MEDICATIONS USED:  MARCAINE     SPECIMEN:  Source of Specimen:  Parietal peel, right pleural fluid, Right pleura  DISPOSITION OF SPECIMEN:  PATHOLOGY  COUNTS:  YES  TOURNIQUET:  * No tourniquets in log *  DICTATION: .Dragon Dictation  PLAN OF CARE: Admit to inpatient   PATIENT DISPOSITION:  ICU - intubated and hemodynamically stable.   Delay start of Pharmacological VTE agent (>24hrs) due to surgical blood loss or risk of bleeding: yes

## 2014-12-19 NOTE — Progress Notes (Signed)
PT Cancellation Note  Patient Details Name: Deeric Cruise MRN: [id number] DOB: 1966-07-01   Cancelled Treatment:    Reason Eval/Treat Not Completed: Patient at procedure or test/unavailable. Pt in OR for R VATS. Will follow up as able.    Rolinda Roan 12/19/2014, 10:00 AM   Rolinda Roan, PT, DPT Acute Rehabilitation Services Pager: [phone]

## 2014-12-19 NOTE — Progress Notes (Signed)
Patient ID: Jeremi Losito, male   DOB: 11-Oct-1965, 49 y.o.   MRN: [id number] EVENING ROUNDS NOTE :     [address]       [place] [address]             [phone]                 Day of Surgery Procedure(s) (LRB): VIDEO ASSISTED THORACOSCOPY/right thoracotomy (VATS)/DRAIN EMPYEMA (Right)  Total Length of Stay:  LOS: 8 days  BP 93/48 mmHg  Pulse 82  Temp(Src) 98.8 F (37.1 C) (Oral)  Resp 16  Ht 5\' 9"  (1.753 m)  Wt 164 lb 3.9 oz (74.5 kg)  BMI 24.24 kg/m2  SpO2 97%  .Intake/Output      04/21 0701 - 04/22 0700   P.O.    I.V. (mL/kg) 1900 (25.5)   IV Piggyback 50   Total Intake(mL/kg) 1950 (26.2)   Urine (mL/kg/hr) 585 (0.6)   Other 200 (0.2)   Stool    Blood 300 (0.3)   Chest Tube 175 (0.2)   Total Output 1260   Net +690         . bupivacaine ON-Q pain pump    . dextrose 5 % and 0.45% NaCl 100 mL/hr at 12/19/14 1400     Lab Results  Component Value Date   WBC 16.7* 12/18/2014   HGB 14.0 12/18/2014   HCT 42.7 12/18/2014   PLT 330 12/18/2014   GLUCOSE 104* 12/18/2014   ALT 41 12/18/2014   AST 39* 12/18/2014   NA 130* 12/18/2014   K 4.9 12/18/2014   CL 92* 12/18/2014   CREATININE 0.80 12/18/2014   BUN 13 12/18/2014   CO2 29 12/18/2014   INR 1.09 12/18/2014   HGBA1C 5.7* 12/11/2014   Good respiratory effort post  op today  Grace Isaac MD  Beeper [phone] Office [phone] 12/19/2014 7:22 PM

## 2014-12-19 NOTE — Progress Notes (Signed)
The patient was examined and preop studies reviewed. There has been no change from the prior exam and the patient is ready for surgery.   Plan Right VATS drainage of empyema on R Kim

## 2014-12-19 NOTE — Op Note (Signed)
NAMEMarland Kitchen  FARLEY, CROOKER NO.:  [id number]  MEDICAL RECORD NO.:  [id number]  LOCATION:  2S07C                        FACILITY:  [facility]  PHYSICIAN:  Ivin Poot, M.D.  DATE OF BIRTH:  06-27-1966  DATE OF PROCEDURE:  12/19/2014 DATE OF DISCHARGE:                              OPERATIVE REPORT   OPERATION: 1. Right video-assisted thoracoscopic surgery (VATS), right     thoracotomy for drainage of empyema and decortication of right     lower lobe. 2. Placement of On-Q wound irrigation system-analgesia.  SURGEON:  Ivin Poot, MD.  ASSISTANTEllwood Handler, PA-C  ANESTHESIA:  General.  PREOPERATIVE DIAGNOSIS:  Right lower lobe pneumonia, right empyema.  POSTOPERATIVE DIAGNOSIS:  Right lower lobe pneumonia, right empyema.  CLINICAL NOTE:  The patient is a 49 year old smoker who was a transfer from an outside hospital after presented with pneumonia, pneumothorax, and had a chest tube placed.  He was started on antibiotics and followup x-ray showed persistent opacification of the right lower lung field.  CT scan showed evidence of an empyema at the right base and with that he developed fever and deteriorating clinical status despite IV antibiotics.  Thoracic Surgical evaluation was requested.  The patient was followed in consultation by Dr. Cyndia Bent who recommended that the patient undergo right VATS for drainage of empyema and decortication of right lower lobe.  Dr. Cyndia Bent asked me to evaluate the patient for surgery as he was unable to get the patient scheduled on his operative schedule.  I examined the patient in his hospital room and reviewed the results of the chest CT scan and the plan for right VATS for the patient and his family.  They understood the major details of surgery including use of general anesthesia, location of the surgical incisions, use of postoperative chest tube drainage system, and the expected postoperative recovery.  They understood  the risks to include bleeding, prolonged air leak, recurrent empyema, prolonged ventilator dependence, and death. After reviewing these issues, he demonstrated his understanding and agreed to proceed with surgery under what I felt was an informed consent.  OPERATIVE PROCEDURE:  The patient was brought to the operating room and placed supine on the operating table where general anesthesia was induced under invasive hemodynamic monitoring.  A double-lumen endotracheal tube was positioned by the Anesthesia team.  The patient was turned right-side up, and the right chest was prepped and draped as a sterile field.  A proper time-out was performed.  A small incision was made centered on the tip of the scapula in the fifth interspace.  It was extended mainly posteriorly which was the area of the loculated empyema. The pleural space was entered and there were thick adhesions with loculated fluid as well as glue-like foul material surrounding the right lower lobe extending under the right hemidiaphragm and posteriorly to the right thoracic wall.  All this area was carefully drained, and all the loculations were opened and the solid material was removed.  The peel on the right lower lobe was dissected and peeled carefully to allow re-expansion of the right lower lobe.  An area of perforation of the pneumonia to the right lower lobe was noted,  this was debrided, irrigated, and closed with some interrupted 3-0 chromic sutures.  In order to perform the decortication, the incision was extended but no ribs were divided. After the empyema had been drained, the loculations had been broken, and the peel was removed from the right lower lobe in the right diaphragm. The chest was irrigated with warm saline.  We then placed 3 chest tubes along the paraspinal gutter, right costophrenic space, and anteriorly to the apex.  These were secured to the skin.  The ribs were then closed after the lung was  re-expanded and filled the space.  The ribs were reapproximated with #2 Vicryl.  The muscle layer was closed with interrupted #1 Vicryl.  The subcutaneous and skin were closed with running Vicryl.  The old chest tube site was closed with some interrupted nylons.  The On-Q catheter was positioned just beneath the main incision and above the chest tube site, secured to the skin, and connected to a reservoir of 0.5% Marcaine.  The patient was then turned supine.  The plan is to extubate the patient and then transfer to the recovery room and then to the ICU.     Ivin Poot, M.D.     PV/MEDQ  D:  12/19/2014  T:  12/19/2014  Job:  [id number]

## 2014-12-19 NOTE — OR Nursing (Addendum)
Dr. Prescott Gum notified of previous abg results and current improved respiratory work of breathing.  Order received for another ABG and bedside assessment from [facility] for NIPPV.  Dr. Conrad Brownfield at bedside and agrees patient is in minimal distress and not needing BIPAP at this time.  New ABG results on 4L [place] obtained and discussed with Dr. Conrad [place].  Will notify Dr. Prescott Gum of patient's status. Pt states it is easier to get his breath than earlier.  He described his breathing as "good, just hurting."

## 2014-12-19 NOTE — Progress Notes (Signed)
RT note-Patient is awake and alert, nebulizer treatment started, report received from PACU RN concerning the need for Bipap at this time. Call to Dr. Prescott Gum, patient remains on 4l/min [place] at this time.

## 2014-12-19 NOTE — Progress Notes (Signed)
[facility]                                                                              Patient Demographics  Jason Ballard, is a 49 y.o. male, DOB - 1966/01/18, PYK:[id number]  Admit date - 12/11/2014   Admitting Physician Juanito Doom, MD  Outpatient Primary MD for the patient is HAGUE, Rosalyn Charters, MD  LOS - 8    chief complaint : Shortness of breath      Brief HPI   Patient is a 49 year old male with history of COPD, asthma, presented from [facility] with acute respiratory failure. He was admitted to [facility] on 4/10 with a right middle lobe pneumonia. Patient was treated with Zithromax and Rocephin. CT chest revealed a cavitating pneumonia and subsequently patient developed pneumothorax. Chest tube was placed on 4/11 and patient was placed on BiPAP while at the OSH which caused a chest tube leak and subsequent subcutaneous emphysema. Sputum culture and blood cultures obtained at the OSH were all confimed negative on 4/13. Patient was transferred to [facility] for right chest tube and possible VATS.    Assessment & Plan    Principal Problems: Acute on chronic respiratory failure due to severe cavitary community-acquired pneumonia, right upper lung cavitary pneumonia status post VATS, right thoracotomy - Continue IV vancomycin and Zosyn, cefuroxime - CT angios of the chest showed large multiloculated thick-walled cavitary mass with a moderate to large probably loculated pleural effusion posteriorly with which the right chest tube does not communicate, ?broncho-pleural fistula. - CT surgery following, status post VATS and right thoracotomy today  Active problems  Pneumothorax, right empyema - Status post VATS and right thoracotomy by CT surgery  COPD currently improving, no active wheezing  Hypertension controlled at this time   Chronic benzodiazepine use Continue low dose xanax to avoid withdrawal  Chronic pain issues- states pain is  not well controlled, patient is on oral Dilaudid and oxycodone at home chronically likely causing the narcotic tolerance - Currently on fentanyl PCA  Code Status: full code  Family Communication: Discussed in detail with the patient, all imaging results, lab results explained to the patient, family and the room   Disposition Plan: Currently in ICU  Time Spent in minutes   25 minutes  Procedures  Chest tube VATS with right thoracotomy 4/21   Consults   Cardiothoracic surgery  DVT Prophylaxis   heparin Medications  Scheduled Meds: . acetaminophen  1,000 mg Oral 4 times per day   Or  . acetaminophen (TYLENOL) oral liquid 160 mg/5 mL  1,000 mg Oral 4 times per day  . ALPRAZolam  0.5 mg Oral TID  . benzonatate  100 mg Oral TID  . bisacodyl  10 mg Oral Daily  . cefUROXime (ZINACEF)  IV  1.5 g Intravenous Q12H  . fentaNYL   Intravenous 6 times per day  . HYDROmorphone      . ketorolac      . levalbuterol      . levalbuterol  0.63 mg Nebulization Q6H  . metoCLOPramide (REGLAN) injection  10 mg Intravenous 4 times per day  . midazolam      .  mometasone-formoterol  2 puff Inhalation BID  . pantoprazole  40 mg Oral Daily  . piperacillin-tazobactam (ZOSYN)  IV  3.375 g Intravenous 3 times per day  . roflumilast  500 mcg Oral Daily  . senna-docusate  1 tablet Oral QHS  . Umeclidinium Bromide  1 puff Inhalation Q1200  . vancomycin  1,250 mg Intravenous Q8H   Continuous Infusions: . bupivacaine ON-Q pain pump    . dextrose 5 % and 0.45% NaCl     PRN Meds:.diphenhydrAMINE **OR** diphenhydrAMINE, ipratropium-albuterol, ketorolac, naloxone **AND** sodium chloride, ondansetron (ZOFRAN) IV, ondansetron (ZOFRAN) IV, potassium chloride   Antibiotics   Anti-infectives    Start     Dose/Rate Route Frequency Ordered Stop   12/19/14 1645  cefUROXime (ZINACEF) 1.5 g in dextrose 5 % 50 mL IVPB     1.5 g 100 mL/hr over 30 Minutes Intravenous Every 12 hours 12/19/14 1608 12/20/14 1644    12/19/14 0400  cefUROXime (ZINACEF) 1.5 g in dextrose 5 % 50 mL IVPB     1.5 g 100 mL/hr over 30 Minutes Intravenous 60 min pre-op 12/18/14 1943 12/19/14 0435   12/19/14 0230  vancomycin (VANCOCIN) 1,250 mg in sodium chloride 0.9 % 250 mL IVPB     1,250 mg 166.7 mL/hr over 90 Minutes Intravenous Every 8 hours 12/18/14 1750     12/18/14 1800  vancomycin (VANCOCIN) 1,250 mg in sodium chloride 0.9 % 250 mL IVPB     1,250 mg 166.7 mL/hr over 90 Minutes Intravenous STAT 12/18/14 1750 12/18/14 1938   12/18/14 1500  piperacillin-tazobactam (ZOSYN) IVPB 3.375 g     3.375 g 12.5 mL/hr over 240 Minutes Intravenous 3 times per day 12/18/14 1445     12/16/14 1600  vancomycin (VANCOCIN) IVPB 1000 mg/200 mL premix  Status:  Discontinued     1,000 mg 200 mL/hr over 60 Minutes Intravenous Every 8 hours 12/16/14 1508 12/18/14 1749   12/16/14 1500  ceFEPIme (MAXIPIME) 1 g in dextrose 5 % 50 mL IVPB  Status:  Discontinued     1 g 100 mL/hr over 30 Minutes Intravenous Every 8 hours 12/16/14 1430 12/18/14 1422   12/13/14 2200  azithromycin (ZITHROMAX) tablet 250 mg     250 mg Oral Daily at bedtime 12/13/14 0821 12/15/14 2232   12/11/14 2100  azithromycin (ZITHROMAX) 250 mg in dextrose 5 % 125 mL IVPB  Status:  Discontinued     250 mg 125 mL/hr over 60 Minutes Intravenous Every 24 hours 12/11/14 2024 12/13/14 0821   12/11/14 2045  cefTRIAXone (ROCEPHIN) 1 g in dextrose 5 % 50 mL IVPB - Premix  Status:  Discontinued     1 g 100 mL/hr over 30 Minutes Intravenous Every 24 hours 12/11/14 2031 12/16/14 1427        Subjective:   Jason Ballard was seen and examined today after VATS, right thoracotomy procedure. Patient alert and awake, eating ice chips multiple family members around bedside, feels okay. Patient denies dizziness, abdominal pain, N/V/D/C, new weakness, numbess, tingling. No fevers, c/o pain on fentanyl PCA   Objective:   Blood pressure 119/60, pulse 75, temperature 98.8 F (37.1 C),  temperature source Oral, resp. rate 38, height 5\' 9"  (1.753 m), weight 74.5 kg (164 lb 3.9 oz), SpO2 100 %.  Wt Readings from Last 3 Encounters:  12/18/14 74.5 kg (164 lb 3.9 oz)     Intake/Output Summary (Last 24 hours) at 12/19/14 1739 Last data filed at 12/19/14 1702  Gross per 24 hour  Intake  1650 ml  Output   2125 ml  Net   -475 ml    Exam  General: Alert and oriented x 3, NAD  HEENT:  PERRLA, EOMI, Anicteic Sclera,  Neck: Supple, no JVD, no masses  CVS: S1 and S2 clear, RRR, tachycardia  Respiratory : Decreased breath sounds at the bases  Extremities: no c/c/e b/l  Neuro: AAOx3, Cr N's II- XII. moving all 4 extremities  Skin: No rashes  Psych: Normal affect and demeanor, alert and oriented x3    Data Review   Micro Results Recent Results (from the past 240 hour(s))  MRSA PCR Screening     Status: None   Collection Time: 12/11/14  9:49 PM  Result Value Ref Range Status   MRSA by PCR NEGATIVE NEGATIVE Final    Comment:        The GeneXpert MRSA Assay (FDA approved for NASAL specimens only), is one component of a comprehensive MRSA colonization surveillance program. It is not intended to diagnose MRSA infection nor to guide or monitor treatment for MRSA infections.   Culture, blood (routine x 2)     Status: None (Preliminary result)   Collection Time: 12/17/14  1:35 PM  Result Value Ref Range Status   Specimen Description BLOOD LEFT ARM  Final   Special Requests BOTTLES DRAWN AEROBIC ONLY 2CC  Final   Culture   Final           BLOOD CULTURE RECEIVED NO GROWTH TO DATE CULTURE WILL BE HELD FOR 5 DAYS BEFORE ISSUING A FINAL NEGATIVE REPORT Note: Culture results may be compromised due to an inadequate volume of blood received in culture bottles. Performed at [facility]    Report Status PENDING  Incomplete  Culture, blood (routine x 2)     Status: None (Preliminary result)   Collection Time: 12/17/14  1:45 PM  Result Value Ref Range Status    Specimen Description BLOOD BLOOD LEFT WRIST  Final   Special Requests BOTTLES DRAWN AEROBIC ONLY 3CC  Final   Culture   Final           BLOOD CULTURE RECEIVED NO GROWTH TO DATE CULTURE WILL BE HELD FOR 5 DAYS BEFORE ISSUING A FINAL NEGATIVE REPORT Note: Culture results may be compromised due to an inadequate volume of blood received in culture bottles. Performed at [facility]    Report Status PENDING  Incomplete    Radiology Reports Dg Chest 2 View  12/17/2014   CLINICAL DATA:  49 year old male with shortness of breath and cough. Cavitary right upper lobe pneumonia. Right chest tube. Initial encounter.  EXAM: CHEST  2 VIEW  COMPARISON:  12/16/2014 and earlier.  FINDINGS: Right chest tube in place with moderate volume right chest wall and neck subcutaneous emphysema. Small right pneumothorax, pleural edge now visible in the apex.  Since 12/09/2014 mildly regressed right upper lobe airspace opacity, but increased right lung base opacity. Right pleural effusion evident on 12/11/2014 CT. The left lung is stable and clear. Stable cardiac size and mediastinal contours. Visualized tracheal air column is within normal limits. No acute osseous abnormality identified.  IMPRESSION: 1. Stable right chest tube with moderate volume right chest wall and neck subcutaneous emphysema. 2. Recurrent small right apical pneumothorax. 3. Mildly regressed right upper lobe pneumonia since 12/09/2014, but worsening right lung base opacity in part due to pleural effusion.   Electronically Signed   By: Genevie Ann M.D.   On: 12/17/2014 08:04   Ct Angio  Chest Pe W/cm &/or Wo Cm  12/17/2014   CLINICAL DATA:  Patient has history of COPD, asthma, presented from [facility] with acute respiratory failure. He was admitted to [facility] on 4/10 with a right middle lobe pneumonia. with Zithromax and cT chest revealed a cavitating pneumonia and subsequently patient developed pneumothorax. Chest tube was placed on  4/11 and patient was placed on BiPAP  EXAM: CT ANGIOGRAPHY CHEST WITH CONTRAST  TECHNIQUE: Multidetector CT imaging of the chest was performed using the standard protocol during bolus administration of intravenous contrast. Multiplanar CT image reconstructions and MIPs were obtained to evaluate the vascular anatomy.  CONTRAST:  60mL OMNIPAQUE IOHEXOL 350 MG/ML SOLN  COMPARISON:  Numerous recent prior studies  FINDINGS: No filling defects in the pulmonary arterial system. Thoracic aorta shows no dissection or dilatation. No significant hilar or mediastinal adenopathy. Mild reactive adenopathy is present.  Thoracic inlet is normal. Trace fluid in the pericardium present. No left effusion. On the right, there is a pleural effusion with numerous air-fluid levels within it suggesting infection. Numerous air-fluid levels are seen at various heights within the moderate to large right pleural effusion.  Left lung is clear. On the right, there is a cavitating mass posteriorly in the inferior aspect of the right upper lobe. There are numerous air-filled cavitary areas within this mass, with its base along the posterior fissure. The mass measures 76 by 42 by 59 mm. There is less airspace infiltrate surrounding the thick-walled cavitary mass currently.  There is a right chest to which enters the thorax anterior laterally in the right middle lobe and then move cranially to terminate within the pleural space near the apex. There is a large volume of subcutaneous emphysema in the right thorax. Other than the air contained within the pleural effusion, there is no significant pneumothorax.  Probable flash fill hemangioma right liver stable. No compression deformities.  Review of the MIP images confirms the above findings.  IMPRESSION: When compared to the prior CT scan performed 12/11/2014, the volume infiltrate throughout posterior segment of the right upper lobe has decreased, but the previous pneumonia is now largely replaced by  a large multiloculated thick walled cavitary mass. Adjacent to this there is a moderate to large probably loculated pleural effusion posteriorly, with which the right chest tube does not communicate. Numerous pockets of gas within the effusion suggest empyema and possibility of bronchopleural fistula.  Large volume subcutaneous emphysema on the right has increased.   Electronically Signed   By: Skipper Cliche M.D.   On: 12/17/2014 16:24   Dg Chest Port 1 View  12/19/2014   CLINICAL DATA:  Status post right thoracotomy  EXAM: PORTABLE CHEST - 1 VIEW  COMPARISON:  12/18/2014  FINDINGS: Cardiac shadow is stable. A right jugular central line is seen in satisfactory position. Three thoracostomy catheters are noted. No pneumothorax is seen. No significant residual empyema is seen. The left lung remains clear.  IMPRESSION: Status post chest tube placement without pneumothorax. No significant residual fluid collection is seen.   Electronically Signed   By: Inez Catalina M.D.   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 On the right there is persistent  increased density in the perihilar and infrahilar region. No pneumothorax is visible today. The right chest tube tip projects over the lateral aspect of the second rib. Considerable subcutaneous emphysema on the right persists. There is no mediastinal shift. The cardiac silhouette and pulmonary vascularity are normal.  IMPRESSION: The small right apical pneumothorax is not visible today. There is persistent density in the right hemi thorax consistent with known empyema.   Electronically Signed   By: David  Martinique M.D.   On: 12/18/2014 07:31   Dg Chest Port 1 View  12/16/2014    CLINICAL DATA:  Pneumothorax follow-up, acute and chronic respiratory failure, long history of tobacco use now discontinued.  EXAM: PORTABLE CHEST - 1 VIEW  COMPARISON:  Portable chest x-ray of December 15, 2014  FINDINGS: There remains a large amount of subcutaneous emphysema in the right axillary region and base of the neck. Two right-sided chest tubes are present and unchanged in position. No pneumothorax is visible. Confluent alveolar opacities persist in the right perihilar region with areas of lucency consistent with known cavitation. Increased right infrahilar alveolar opacities are noted as well. There is no significant pleural effusion. There is no mediastinal shift. The left lung is clear. The heart and pulmonary vascularity are normal.  IMPRESSION: 1. No pneumothorax is visible today. The chest tubes are unchanged in position. 2. Known cavitary pneumonia on the right, unchanged. Increasing right basilar airspace disease.   Electronically Signed   By: David  Martinique   On: 12/16/2014 07:26   Dg Chest Port 1 View  12/15/2014   CLINICAL DATA:  Pneumothorax  EXAM: PORTABLE CHEST - 1 VIEW  COMPARISON:  12/14/2014  FINDINGS: Unchanged positioning of the right-sided chest tube. A trace right apical pneumothorax is present, 5% or less. Extensive right chest wall emphysema is stable.  The cavitary pneumonia in the right upper lobe it is again noted. Clear left chest. Normal heart size.  IMPRESSION: 1. Trace right apical pneumothorax with unchanged chest tube positioning. 2. Cavitary right upper lobe pneumonia.   Electronically Signed   By: Monte Fantasia M.D.   On: 12/15/2014 06:08   Dg Chest Port 1 View  12/14/2014   CLINICAL DATA:  Status post chest tube placement  EXAM: PORTABLE CHEST - 1 VIEW  COMPARISON:  12/13/2014  FINDINGS: Cardiac shadow is within normal limits. The left lung is clear. A right-sided chest tube is again identified. No definitive pneumothorax is seen. Considerable subcutaneous emphysema  is again noted. Mild right upper lobe cavitary lesion is again identified and stable.  IMPRESSION: No right-sided pneumothorax. The overall appearance is stable from the prior exam.   Electronically Signed   By: Inez Catalina M.D.   On: 12/14/2014 07:14   Dg Chest Port 1 View  12/13/2014   CLINICAL DATA:  Right pneumothorax.  Chest tube.  EXAM: PORTABLE CHEST - 1 VIEW  COMPARISON:  12/12/2014  FINDINGS: The right pneumothorax has resolved. Decreased subcutaneous emphysema on the right. Chest tube remains in good position. Improving cavitary infiltrate in the right upper lobe. Slight progression of new atelectasis at the left lung base laterally. Heart size and pulmonary vascularity are normal.  IMPRESSION: 1. Resolution of right pneumothorax. 2. Improving cavitary infiltrate in the right upper lobe. 3. Slightly increasing left base atelectasis laterally.   Electronically Signed   By: Lorriane Shire M.D.   On: 12/13/2014 07:52   Portable Chest 1 View  12/12/2014   CLINICAL DATA:  Followup pneumonia.  EXAM: PORTABLE CHEST -  1 VIEW  COMPARISON:  12/11/2014  FINDINGS: Since the previous day's study, a small right pneumothorax has become apparent, estimated at 20%. Right-sided chest tube is stable. Significant right-sided subcutaneous emphysema is also without substantial change.  Airspace consolidation in right upper lobe extending from the right hilum is similar to the prior exam. Reticular opacities are noted in both lower lungs, increased from the previous day's study, likely atelectasis.  No left pneumothorax.  No convincing pleural effusion.  IMPRESSION: 1. Approximately 20% right-sided pneumothorax, new from the previous day's study. 2. Stable right upper lobe consolidation. 3. Mild increase in lung base reticular opacity most likely atelectasis. No other change.   Electronically Signed   By: Lajean Manes M.D.   On: 12/12/2014 08:02   Dg Chest Port 1 View  12/11/2014   CLINICAL DATA:  Pneumonia.  COPD.   EXAM: PORTABLE CHEST - 1 VIEW  COMPARISON:  12/11/2014.  FINDINGS: The right-sided chest tube is stable. Interval worsening of subcutaneous emphysema suggesting an air leak. No obvious right-sided pneumothorax. Persistent right mid lung infiltrate. The left lung is clear.  IMPRESSION: Stable chest tube but progressive subcutaneous emphysema on the right side. No obvious pneumothorax.   Electronically Signed   By: Marijo Sanes M.D.   On: 12/11/2014 19:30    CBC  Recent Labs Lab 12/13/14 0340 12/16/14 0305 12/17/14 0550 12/18/14 2045  WBC 14.6* 24.2* 38.3* 16.7*  HGB 11.3* 14.4 15.7 14.0  HCT 34.2* 43.7 45.9 42.7  PLT 307 336 PLATELET CLUMPS NOTED ON SMEAR, COUNT APPEARS ADEQUATE 330  MCV 90.0 90.7 90.5 92.0  MCH 29.7 29.9 31.0 30.2  MCHC 33.0 33.0 34.2 32.8  RDW 13.1 13.5 13.8 14.0    Chemistries   Recent Labs Lab 12/13/14 0340 12/16/14 0305 12/18/14 2045  NA 140 136 130*  K 3.6 4.2 4.9  CL 101 98 92*  CO2 31 29 29   GLUCOSE 146* 104* 104*  BUN 15 11 13   CREATININE 0.51 0.67 0.80  CALCIUM 7.8* 7.9* 8.0*  MG 2.5  --   --   AST  --  27 39*  ALT  --  53 41  ALKPHOS  --  72 88  BILITOT  --  0.7 1.2   ------------------------------------------------------------------------------------------------------------------ estimated creatinine clearance is 111.7 mL/min (by C-G formula based on Cr of 0.8). ------------------------------------------------------------------------------------------------------------------ No results for input(s): HGBA1C in the last 72 hours. ------------------------------------------------------------------------------------------------------------------ No results for input(s): CHOL, HDL, LDLCALC, TRIG, CHOLHDL, LDLDIRECT in the last 72 hours. ------------------------------------------------------------------------------------------------------------------ No results for input(s): TSH, T4TOTAL, T3FREE, THYROIDAB in the last 72 hours.  Invalid  input(s): FREET3 ------------------------------------------------------------------------------------------------------------------ No results for input(s): VITAMINB12, FOLATE, FERRITIN, TIBC, IRON, RETICCTPCT in the last 72 hours.  Coagulation profile  Recent Labs Lab 12/18/14 2045  INR 1.09    No results for input(s): DDIMER in the last 72 hours.  Cardiac Enzymes No results for input(s): CKMB, TROPONINI, MYOGLOBIN in the last 168 hours.  Invalid input(s): CK ------------------------------------------------------------------------------------------------------------------ Invalid input(s): POCBNP  No results for input(s): GLUCAP in the last 72 hours.   Bodey Frizell M.D. [facility] 12/19/2014, 5:39 PM  Pager: [phone]   Between 7am to 7pm - call Pager - [phone]  After 7pm go to [email or web address] - password [email or web address]  Call night coverage person covering after 7pm

## 2014-12-19 NOTE — Anesthesia Postprocedure Evaluation (Signed)
Anesthesia Post Note  Patient: Jason Ballard  Procedure(s) Performed: Procedure(s) (LRB): VIDEO ASSISTED THORACOSCOPY/right thoracotomy (VATS)/DRAIN EMPYEMA (Right)  Anesthesia type: general  Patient location: PACU  Post pain: Pain level controlled  Post assessment: Patient's Cardiovascular Status Stable  Last Vitals:  Filed Vitals:   12/19/14 1430  BP:   Pulse: 88  Temp:   Resp: 23    Post vital signs: Reviewed and stable  Level of consciousness: sedated  Complications: No apparent anesthesia complications

## 2014-12-19 NOTE — Transfer of Care (Signed)
Immediate Anesthesia Transfer of Care Note  Patient: Jason Ballard  Procedure(s) Performed: Procedure(s): VIDEO ASSISTED THORACOSCOPY/right thoracotomy (VATS)/DRAIN EMPYEMA (Right)  Patient Location: PACU  Anesthesia Type:General  Level of Consciousness: awake and alert   Airway & Oxygen Therapy: Patient Spontanous Breathing and Patient connected to face mask oxygen  Post-op Assessment: Report given to RN and Post -op Vital signs reviewed and stable  Post vital signs: Reviewed and stable  Last Vitals:  Filed Vitals:   12/19/14 0847  BP: 107/39  Pulse: 87  Temp:   Resp: 22    Complications: No apparent anesthesia complications

## 2014-12-19 NOTE — Anesthesia Postprocedure Evaluation (Signed)
Anesthesia Post Note  Patient: Jason Ballard  Procedure(s) Performed: Procedure(s) (LRB): VIDEO ASSISTED THORACOSCOPY/right thoracotomy (VATS)/DRAIN EMPYEMA (Right)  Anesthesia type: general  Patient location: PACU  Post pain: Pain level controlled  Post assessment: Patient's Cardiovascular Status Stable  Last Vitals:  Filed Vitals:   12/19/14 1530  BP: 115/61  Pulse: 74  Temp:   Resp: 15    Post vital signs: Reviewed and stable  Level of consciousness: sedated  Complications: No apparent anesthesia complications

## 2014-12-19 NOTE — Anesthesia Preprocedure Evaluation (Addendum)
Anesthesia Evaluation  Patient identified by MRN, date of birth, ID band Patient awake    Reviewed: Allergy & Precautions, NPO status , Patient's Chart, lab work & pertinent test results  Airway Mallampati: II  TM Distance: >3 FB Neck ROM: Full    Dental   Pulmonary COPDformer smoker,    Pulmonary exam normal       Cardiovascular     Neuro/Psych CVA, No Residual Symptoms    GI/Hepatic   Endo/Other    Renal/GU      Musculoskeletal   Abdominal   Peds  Hematology   Anesthesia Other Findings   Reproductive/Obstetrics                            Anesthesia Physical Anesthesia Plan  ASA: III  Anesthesia Plan: General   Post-op Pain Management:    Induction: Intravenous  Airway Management Planned: Oral ETT  Additional Equipment:   Intra-op Plan:   Post-operative Plan: Extubation in OR  Informed Consent: I have reviewed the patients History and Physical, chart, labs and discussed the procedure including the risks, benefits and alternatives for the proposed anesthesia with the patient or authorized representative who has indicated his/her understanding and acceptance.     Plan Discussed with: CRNA and Surgeon  Anesthesia Plan Comments:         Anesthesia Quick Evaluation

## 2014-12-20 ENCOUNTER — Inpatient Hospital Stay (HOSPITAL_COMMUNITY): Payer: Medicare Other

## 2014-12-20 ENCOUNTER — Encounter (HOSPITAL_COMMUNITY): Payer: Self-pay | Admitting: Cardiothoracic Surgery

## 2014-12-20 LAB — CBC
HCT: 35.3 % — ABNORMAL LOW (ref 39.0–52.0)
HEMOGLOBIN: 11.4 g/dL — AB (ref 13.0–17.0)
MCH: 29.8 pg (ref 26.0–34.0)
MCHC: 32.3 g/dL (ref 30.0–36.0)
MCV: 92.4 fL (ref 78.0–100.0)
Platelets: 316 10*3/uL (ref 150–400)
RBC: 3.82 MIL/uL — ABNORMAL LOW (ref 4.22–5.81)
RDW: 13.9 % (ref 11.5–15.5)
WBC: 8.3 10*3/uL (ref 4.0–10.5)

## 2014-12-20 LAB — POCT I-STAT 3, ART BLOOD GAS (G3+)
ACID-BASE EXCESS: 7 mmol/L — AB (ref 0.0–2.0)
Acid-Base Excess: 6 mmol/L — ABNORMAL HIGH (ref 0.0–2.0)
BICARBONATE: 33.4 meq/L — AB (ref 20.0–24.0)
Bicarbonate: 32.8 mEq/L — ABNORMAL HIGH (ref 20.0–24.0)
O2 SAT: 69 %
O2 Saturation: 89 %
Patient temperature: 98.6
Patient temperature: 98.6
TCO2: 34 mmol/L (ref 0–100)
TCO2: 35 mmol/L (ref 0–100)
pCO2 arterial: 53.6 mmHg — ABNORMAL HIGH (ref 35.0–45.0)
pCO2 arterial: 55.6 mmHg — ABNORMAL HIGH (ref 35.0–45.0)
pH, Arterial: 7.386 (ref 7.350–7.450)
pH, Arterial: 7.395 (ref 7.350–7.450)
pO2, Arterial: 38 mmHg — CL (ref 80.0–100.0)
pO2, Arterial: 59 mmHg — ABNORMAL LOW (ref 80.0–100.0)

## 2014-12-20 LAB — TYPE AND SCREEN
ABO/RH(D): B POS
Antibody Screen: NEGATIVE
Unit division: 0
Unit division: 0
Unit division: 0

## 2014-12-20 LAB — BASIC METABOLIC PANEL
Anion gap: 5 (ref 5–15)
BUN: 10 mg/dL (ref 6–23)
CALCIUM: 7.5 mg/dL — AB (ref 8.4–10.5)
CO2: 35 mmol/L — ABNORMAL HIGH (ref 19–32)
CREATININE: 0.68 mg/dL (ref 0.50–1.35)
Chloride: 96 mmol/L (ref 96–112)
GFR calc Af Amer: >90 mL/min (ref >90)
GFR calc non Af Amer: >90 mL/min (ref >90)
GLUCOSE: 103 mg/dL — AB (ref 70–99)
POTASSIUM: 4.3 mmol/L (ref 3.5–5.1)
Sodium: 136 mmol/L (ref 135–145)

## 2014-12-20 MED ORDER — FENTANYL 25 MCG/HR TD PT72
75.0000 ug | MEDICATED_PATCH | TRANSDERMAL | Status: DC
  Administered 2014-12-20: 75 ug via TRANSDERMAL
  Filled 2014-12-20: qty 3
  Filled 2014-12-20: qty 1

## 2014-12-20 MED ORDER — HYDROMORPHONE HCL 2 MG PO TABS
2.0000 mg | ORAL_TABLET | ORAL | Status: DC | PRN
  Administered 2014-12-20 – 2014-12-23 (×15): 2 mg via ORAL
  Filled 2014-12-20 (×16): qty 1

## 2014-12-20 MED ORDER — DEXTROSE-NACL 5-0.45 % IV SOLN
INTRAVENOUS | Status: DC
  Administered 2014-12-20 – 2014-12-22 (×2): via INTRAVENOUS

## 2014-12-20 MED ORDER — DOPAMINE-DEXTROSE 3.2-5 MG/ML-% IV SOLN
INTRAVENOUS | Status: AC
  Filled 2014-12-20: qty 250

## 2014-12-20 NOTE — Treatment Plan (Signed)
Patient in ICU, discussed with Dr. Prescott Gum, currently under CT surgery. Will pick up care once patient is transferred out to the floor.   RAI,RIPUDEEP M.D. [facility] 12/20/2014, 10:59 AM  Pager: [phone]

## 2014-12-20 NOTE — Progress Notes (Signed)
1 Day Post-Op Procedure(s) (LRB): VIDEO ASSISTED THORACOSCOPY/right thoracotomy (VATS)/DRAIN EMPYEMA (Right) Subjective: First day after right VATS drainage of empyema and decortication right lower lobe Pain control adequate Minimal air leak from chest tubes Wound cultures from operative specimen pending  Objective: Vital signs in last 24 hours: Temp:  [97.6 F (36.4 C)-98.1 F (36.7 C)] 98.1 F (36.7 C) (04/22 1600) Pulse Rate:  [65-100] 88 (04/22 1900) Cardiac Rhythm:  [-] Normal sinus rhythm (04/22 1437) Resp:  [14-36] 15 (04/22 2005) BP: (90-153)/(43-93) 147/93 mmHg (04/22 1900) SpO2:  [96 %-100 %] 100 % (04/22 2005) Arterial Line BP: (71-143)/(63-125) 71/65 mmHg (04/22 0415) Weight:  [162 lb 4.8 oz (73.619 kg)] 162 lb 4.8 oz (73.619 kg) (04/22 0500)  Hemodynamic parameters for last 24 hours:    Intake/Output from previous day: 04/21 0701 - 04/22 0700 In: 5747 [P.O.:500; I.V.:3100; IV Piggyback:450] Out: 2885 [Urine:2110; Blood:300; Chest Tube:275] Intake/Output this shift:      Lab Results:  Recent Labs  12/18/14 2045 12/20/14 0415  WBC 16.7* 8.3  HGB 14.0 11.4*  HCT 42.7 35.3*  PLT 330 316   BMET:  Recent Labs  12/18/14 2045 12/20/14 0415  NA 130* 136  K 4.9 4.3  CL 92* 96  CO2 29 35*  GLUCOSE 104* 103*  BUN 13 10  CREATININE 0.80 0.68  CALCIUM 8.0* 7.5*    PT/INR:  Recent Labs  12/18/14 2045  LABPROT 14.2  INR 1.09   ABG    Component Value Date/Time   PHART 7.395 12/20/2014 0509   HCO3 32.8* 12/20/2014 0509   TCO2 34 12/20/2014 0509   O2SAT 89.0 12/20/2014 0509   CBG (last 3)  No results for input(s): GLUCAP in the last 72 hours.  Assessment/Plan: S/P Procedure(s) (LRB): VIDEO ASSISTED THORACOSCOPY/right thoracotomy (VATS)/DRAIN EMPYEMA (Right) Mobilize Continue foley due to urinary output monitoring See progression orders   LOS: 9 days    Tharon Aquas Trigt III 12/20/2014

## 2014-12-21 ENCOUNTER — Inpatient Hospital Stay (HOSPITAL_COMMUNITY): Payer: Medicare Other

## 2014-12-21 LAB — CBC
HEMATOCRIT: 35.3 % — AB (ref 39.0–52.0)
HEMOGLOBIN: 11.3 g/dL — AB (ref 13.0–17.0)
MCH: 29.8 pg (ref 26.0–34.0)
MCHC: 32 g/dL (ref 30.0–36.0)
MCV: 93.1 fL (ref 78.0–100.0)
PLATELETS: 370 10*3/uL (ref 150–400)
RBC: 3.79 MIL/uL — AB (ref 4.22–5.81)
RDW: 14.2 % (ref 11.5–15.5)
WBC: 7.3 10*3/uL (ref 4.0–10.5)

## 2014-12-21 LAB — COMPREHENSIVE METABOLIC PANEL
ALBUMIN: 1.6 g/dL — AB (ref 3.5–5.2)
ALT: 37 U/L (ref 0–53)
ANION GAP: 5 (ref 5–15)
AST: 61 U/L — ABNORMAL HIGH (ref 0–37)
Alkaline Phosphatase: 72 U/L (ref 39–117)
BILIRUBIN TOTAL: 0.8 mg/dL (ref 0.3–1.2)
BUN: 10 mg/dL (ref 6–23)
CO2: 34 mmol/L — ABNORMAL HIGH (ref 19–32)
CREATININE: 0.72 mg/dL (ref 0.50–1.35)
Calcium: 7.5 mg/dL — ABNORMAL LOW (ref 8.4–10.5)
Chloride: 96 mmol/L (ref 96–112)
GFR calc non Af Amer: >90 mL/min (ref >90)
GLUCOSE: 94 mg/dL (ref 70–99)
Potassium: 4.1 mmol/L (ref 3.5–5.1)
SODIUM: 135 mmol/L (ref 135–145)
Total Protein: 5.7 g/dL — ABNORMAL LOW (ref 6.0–8.3)

## 2014-12-21 LAB — VANCOMYCIN, TROUGH: Vancomycin Tr: 17.9 ug/mL (ref 10.0–20.0)

## 2014-12-21 NOTE — Progress Notes (Signed)
Two milliliters of previous fentanyl syringe wasted (no place on MAR or pyxis to document waste. Witnessed with LB RN.

## 2014-12-21 NOTE — Progress Notes (Signed)
Report called to [place], 2W-RN. Pt transferred to 2W-12 via ambulation, belongings at bedside, family notified of room change. Meds in chart, VS stable at time of transfer. Bedside handoff to [place], 2W-RN. No current questions or complaints at this time.  Jason Ballard L

## 2014-12-21 NOTE — Progress Notes (Signed)
2 Days Post-Op Procedure(s) (LRB): VIDEO ASSISTED THORACOSCOPY/right thoracotomy (VATS)/DRAIN EMPYEMA (Right) Subjective: paon control adequate CXR clear Small air leak tx to stepdown  Objective: Vital signs in last 24 hours: Temp:  [97.6 F (36.4 C)-99.4 F (37.4 C)] 97.9 F (36.6 C) (04/23 0805) Pulse Rate:  [66-111] 96 (04/23 0600) Cardiac Rhythm:  [-] Normal sinus rhythm (04/23 0600) Resp:  [14-28] 21 (04/23 0730) BP: (110-153)/(54-106) 133/75 mmHg (04/23 0600) SpO2:  [91 %-100 %] 99 % (04/23 0754) Weight:  [166 lb (75.297 kg)] 166 lb (75.297 kg) (04/23 0600)  Hemodynamic parameters for last 24 hours:  stable  Intake/Output from previous day: 04/22 0701 - 04/23 0700 In: 3004 [P.O.:1380; I.V.:736.5; IV Piggyback:887.5] Out: 1461 [Urine:1260; Stool:1; Chest Tube:200] Intake/Output this shift:    Lungs clear  Lab Results:  Recent Labs  12/20/14 0415 12/21/14 0320  WBC 8.3 7.3  HGB 11.4* 11.3*  HCT 35.3* 35.3*  PLT 316 370   BMET:  Recent Labs  12/20/14 0415 12/21/14 0320  NA 136 135  K 4.3 4.1  CL 96 96  CO2 35* 34*  GLUCOSE 103* 94  BUN 10 10  CREATININE 0.68 0.72  CALCIUM 7.5* 7.5*    PT/INR:  Recent Labs  12/18/14 2045  LABPROT 14.2  INR 1.09   ABG    Component Value Date/Time   PHART 7.395 12/20/2014 0509   HCO3 32.8* 12/20/2014 0509   TCO2 34 12/20/2014 0509   O2SAT 89.0 12/20/2014 0509   CBG (last 3)  No results for input(s): GLUCAP in the last 72 hours.  Assessment/Plan: S/P Procedure(s) (LRB): VIDEO ASSISTED THORACOSCOPY/right thoracotomy (VATS)/DRAIN EMPYEMA (Right) d/c tubes/lines Plan for transfer to step-down: see transfer orders Cont IV antibiotics and PCA  LOS: 10 days    Jason Ballard 12/21/2014

## 2014-12-21 NOTE — Progress Notes (Signed)
ANTIBIOTIC CONSULT NOTE - FOLLOW UP  Pharmacy Consult for Vancomycin / Zosyn Indication: Cavitary PNA  No Known Allergies  Patient Measurements: Height: 5\' 9"  (175.3 cm) Weight: 166 lb (75.297 kg) IBW/kg (Calculated) : 70.7 Adjusted Body Weight:    Vital Signs: Temp: 99.6 F (37.6 C) (04/23 1153) Temp Source: Oral (04/23 1153) BP: 122/68 mmHg (04/23 1100) Pulse Rate: 105 (04/23 0930) Intake/Output from previous day: 04/22 0701 - 04/23 0700 In: 3054 [P.O.:1380; I.V.:786.5; IV Piggyback:887.5] Out: 1941 [Urine:1260; Stool:1; Chest Tube:200] Intake/Output from this shift: Total I/O In: 466 [P.O.:240; I.V.:213.5; IV Piggyback:12.5] Out: 600 [Urine:550; Chest Tube:50]  Labs:  Recent Labs  12/18/14 2045 12/20/14 0415 12/21/14 0320  WBC 16.7* 8.3 7.3  HGB 14.0 11.4* 11.3*  PLT 330 316 370  CREATININE 0.80 0.68 0.72   Estimated Creatinine Clearance: 111.7 mL/min (by C-G formula based on Cr of 0.72).  Recent Labs  12/18/14 1634 12/21/14 0930  VANCOTROUGH 11.0 17.9     Assessment: 49 y.o. male presents from [facility] on 4/13 for management of R chest tube. Pt was on abx (Rocephin and Azithromycin) for RUL cavitary PNA. Antibiotics were broadened to Vancomycin and Cefepime to provide HCAP coverage with newly developed LLL infiltrate on CXR. 4/20 decision made to switch cefepime to Zosyn. SCr 0.67, est CrCl > 100 ml/min. WBC elevated at 38.3> now wnl, tmax/24h 101.7> afebrile.  Vancomycin dose increased 4/20 1250mg  q 8hr - VT this am 17 - within goal.    4/19 Bld x2 - NGTD 4/21 pleural fluid NGTD   azith 4/13>>4/17 ctx 4/13>>4/18 Vanc 4/18>> Cefepime 4/18>>4/20 Zosyn 4/20>>  Goal of Therapy:  Vancomycin trough level 15-20 mcg/ml  Plan:  Continue Vancomycin to 1250mg  IV q8 hrs Zosyn 3.375GM IV q8 EI   Bonnita Nasuti Pharm.D. CPP, BCPS Clinical Pharmacist [phone] 12/21/2014 12:22 PM

## 2014-12-22 ENCOUNTER — Inpatient Hospital Stay (HOSPITAL_COMMUNITY): Payer: Medicare Other

## 2014-12-22 LAB — CBC
HCT: 37 % — ABNORMAL LOW (ref 39.0–52.0)
Hemoglobin: 11.5 g/dL — ABNORMAL LOW (ref 13.0–17.0)
MCH: 29.7 pg (ref 26.0–34.0)
MCHC: 31.1 g/dL (ref 30.0–36.0)
MCV: 95.6 fL (ref 78.0–100.0)
Platelets: 393 10*3/uL (ref 150–400)
RBC: 3.87 MIL/uL — ABNORMAL LOW (ref 4.22–5.81)
RDW: 14.3 % (ref 11.5–15.5)
WBC: 8.9 10*3/uL (ref 4.0–10.5)

## 2014-12-22 LAB — BASIC METABOLIC PANEL
Anion gap: 6 (ref 5–15)
BUN: 9 mg/dL (ref 6–23)
CO2: 35 mmol/L — ABNORMAL HIGH (ref 19–32)
Calcium: 7.5 mg/dL — ABNORMAL LOW (ref 8.4–10.5)
Chloride: 96 mmol/L (ref 96–112)
Creatinine, Ser: 0.66 mg/dL (ref 0.50–1.35)
GFR calc Af Amer: >90 mL/min (ref >90)
GFR calc non Af Amer: >90 mL/min (ref >90)
Glucose, Bld: 89 mg/dL (ref 70–99)
Potassium: 4 mmol/L (ref 3.5–5.1)
Sodium: 137 mmol/L (ref 135–145)

## 2014-12-22 LAB — BODY FLUID CULTURE: Culture: NO GROWTH

## 2014-12-22 MED ORDER — SODIUM CHLORIDE 0.9 % IJ SOLN
10.0000 mL | INTRAMUSCULAR | Status: DC | PRN
  Administered 2014-12-22: 10 mL
  Administered 2014-12-23: 20 mL
  Administered 2014-12-24: 10 mL
  Filled 2014-12-22 (×2): qty 40

## 2014-12-22 NOTE — Progress Notes (Signed)
Pt had a moderate amount of serosanguinous drainage around chest tube sites, PA was notified and site was reinforced with abd pads.

## 2014-12-22 NOTE — Progress Notes (Addendum)
[address]       [place] [address]             [phone]      3 Days Post-Op Procedure(s) (LRB): VIDEO ASSISTED THORACOSCOPY/right thoracotomy (VATS)/DRAIN EMPYEMA (Right) Subjective: conts to feel better, not SOB  Objective: Vital signs in last 24 hours: Temp:  [97.7 F (36.5 C)-99.6 F (37.6 C)] 98.3 F (36.8 C) (04/24 0606) Pulse Rate:  [66-106] 85 (04/24 0606) Cardiac Rhythm:  [-] Normal sinus rhythm (04/23 1945) Resp:  [15-24] 24 (04/24 0606) BP: (118-136)/(61-73) 118/67 mmHg (04/24 0606) SpO2:  [93 %-100 %] 95 % (04/24 0806) Weight:  [165 lb 12.8 oz (75.206 kg)] 165 lb 12.8 oz (75.206 kg) (04/24 0606)  Hemodynamic parameters for last 24 hours:    Intake/Output from previous day: 04/23 0701 - 04/24 0700 In: 1466 [P.O.:1240; I.V.:213.5; IV Piggyback:12.5] Out: 9449 [Urine:1100; Stool:1; Chest Tube:150] Intake/Output this shift:    General appearance: alert, cooperative and no distress Heart: regular rate and rhythm Lungs: dim on right Abdomen: benign Extremities: no edema Wound: incis healing well  Lab Results:  Recent Labs  12/21/14 0320 12/22/14 0434  WBC 7.3 8.9  HGB 11.3* 11.5*  HCT 35.3* 37.0*  PLT 370 393   BMET:  Recent Labs  12/21/14 0320 12/22/14 0434  NA 135 137  K 4.1 4.0  CL 96 96  CO2 34* 35*  GLUCOSE 94 89  BUN 10 9  CREATININE 0.72 0.66  CALCIUM 7.5* 7.5*    Results for orders placed or performed during the hospital encounter of 12/11/14  MRSA PCR Screening     Status: None   Collection Time: 12/11/14  9:49 PM  Result Value Ref Range Status   MRSA by PCR NEGATIVE NEGATIVE Final    Comment:        The GeneXpert MRSA Assay (FDA approved for NASAL specimens only), is one component of a comprehensive MRSA colonization surveillance program. It is not intended to diagnose MRSA infection nor to guide or monitor treatment for MRSA infections.   Culture, blood (routine x 2)     Status: None  (Preliminary result)   Collection Time: 12/17/14  1:35 PM  Result Value Ref Range Status   Specimen Description BLOOD LEFT ARM  Final   Special Requests BOTTLES DRAWN AEROBIC ONLY 2CC  Final   Culture   Final           BLOOD CULTURE RECEIVED NO GROWTH TO DATE CULTURE WILL BE HELD FOR 5 DAYS BEFORE ISSUING A FINAL NEGATIVE REPORT Note: Culture results may be compromised due to an inadequate volume of blood received in culture bottles. Performed at [facility]    Report Status PENDING  Incomplete  Culture, blood (routine x 2)     Status: None (Preliminary result)   Collection Time: 12/17/14  1:45 PM  Result Value Ref Range Status   Specimen Description BLOOD BLOOD LEFT WRIST  Final   Special Requests BOTTLES DRAWN AEROBIC ONLY 3CC  Final   Culture   Final           BLOOD CULTURE RECEIVED NO GROWTH TO DATE CULTURE WILL BE HELD FOR 5 DAYS BEFORE ISSUING A FINAL NEGATIVE REPORT Note: Culture results may be compromised due to an inadequate volume of blood received in culture bottles. Performed at [facility]    Report Status PENDING  Incomplete  Anaerobic culture     Status: None (Preliminary result)  Collection Time: 12/19/14 11:02 AM  Result Value Ref Range Status   Specimen Description FLUID RIGHT PLEURAL  Final   Special Requests PATIENT ON FOLLOWING ZINACEF VANCOMYCIN ZOSYN  Final   Gram Stain   Final    RARE WBC PRESENT, PREDOMINANTLY PMN NO ORGANISMS SEEN Performed at [facility]    Culture   Final    NO ANAEROBES ISOLATED; CULTURE IN PROGRESS FOR 5 DAYS Performed at [facility]    Report Status PENDING  Incomplete  Body fluid culture     Status: None (Preliminary result)   Collection Time: 12/19/14 11:02 AM  Result Value Ref Range Status   Specimen Description FLUID RIGHT PLEURAL  Final   Special Requests PATIENT ON FOLLOWING ZINACEF VANCOMYCIN ZOSYN  Final   Gram Stain   Final    RARE WBC PRESENT, PREDOMINANTLY PMN NO ORGANISMS  SEEN Performed at [facility]    Culture   Final    NO GROWTH 2 DAYS Performed at [facility]    Report Status PENDING  Incomplete     PT/INR: No results for input(s): LABPROT, INR in the last 72 hours. ABG    Component Value Date/Time   PHART 7.395 12/20/2014 0509   HCO3 32.8* 12/20/2014 0509   TCO2 34 12/20/2014 0509   O2SAT 89.0 12/20/2014 0509   CBG (last 3)  No results for input(s): GLUCAP in the last 72 hours.  Meds Scheduled Meds: . acetaminophen  1,000 mg Oral 4 times per day   Or  . acetaminophen (TYLENOL) oral liquid 160 mg/5 mL  1,000 mg Oral 4 times per day  . ALPRAZolam  0.5 mg Oral TID  . benzonatate  100 mg Oral TID  . bisacodyl  10 mg Oral Daily  . fentaNYL  75 mcg Transdermal Q72H  . fentaNYL   Intravenous 6 times per day  . levalbuterol  0.63 mg Nebulization Q6H  . mometasone-formoterol  2 puff Inhalation BID  . pantoprazole  40 mg Oral Daily  . piperacillin-tazobactam (ZOSYN)  IV  3.375 g Intravenous 3 times per day  . roflumilast  500 mcg Oral Daily  . senna-docusate  1 tablet Oral QHS  . Umeclidinium Bromide  1 puff Inhalation Q1200  . vancomycin  1,250 mg Intravenous Q8H   Continuous Infusions: . bupivacaine ON-Q pain pump    . dextrose 5 % and 0.45% NaCl 50 mL/hr at 12/20/14 1900   PRN Meds:.diphenhydrAMINE **OR** diphenhydrAMINE, HYDROmorphone, ipratropium-albuterol, ketorolac, naloxone **AND** sodium chloride, ondansetron (ZOFRAN) IV, ondansetron (ZOFRAN) IV, potassium chloride  Xrays Dg Chest Port 1 View  12/21/2014   CLINICAL DATA:  One day postop thoracoscopy for empyema in the right posterior pleural space.  EXAM: PORTABLE CHEST - 1 VIEW  COMPARISON:  Portable chest x-rays yesterday and earlier.  FINDINGS: Cardiomediastinal silhouette unremarkable, unchanged. Three right chest tubes in place with no pneumothorax. Subcutaneous emphysema in the right chest wall and right neck, unchanged. Abscess in the right upper lobe,  unchanged. No visible residual empyema in the right lower chest. Left lung remains clear.  IMPRESSION: 1. 3 right chest tubes in place with no pneumothorax. 2. Stable right upper lobe abscess. 3. No visible residual empyema in the right lower chest. 4. Stable subcutaneous emphysema in the right chest wall and right neck. 5. No new abnormalities.   Electronically Signed   By: Evangeline Dakin M.D.   On: 12/21/2014 09:19   Chest tube - no air leak, 130 ml out yesterday  Assessment/Plan: S/P Procedure(s) (LRB): VIDEO ASSISTED THORACOSCOPY/right thoracotomy (VATS)/DRAIN EMPYEMA (Right)  1 doing well 2 cont current abx 3 poss d/c 1 chest tube soon 4 hospitalist to resume medical management( discussed with them)     LOS: 11 days    GOLD,WAYNE E 12/22/2014  patient examined and medical record reviewed,agree with above note. Tharon Aquas Trigt III 12/22/2014

## 2014-12-22 NOTE — Progress Notes (Signed)
[facility]                                                                              Patient Demographics  Jason Ballard, is a 49 y.o. male, DOB - 1965-09-10, RCV:[id number]  Admit date - 12/11/2014   Admitting Physician Juanito Doom, MD  Outpatient Primary MD for the patient is HAGUE, Rosalyn Charters, MD  LOS - 82    chief complaint : Shortness of breath      Brief HPI   Patient is a 49 year old male with history of COPD, asthma, presented from [facility] with acute respiratory failure. He was admitted to [facility] on 4/10 with a right middle lobe pneumonia. Patient was treated with Zithromax and Rocephin. CT chest revealed a cavitating pneumonia and subsequently patient developed pneumothorax. Chest tube was placed on 4/11 and patient was placed on BiPAP while at the OSH which caused a chest tube leak and subsequent subcutaneous emphysema. Sputum culture and blood cultures obtained at the OSH were all confimed negative on 4/13. Patient was transferred to [facility] for right chest tube and possible VATS.    Assessment & Plan    Principal Problems: Acute on chronic respiratory failure due to severe cavitary community-acquired pneumonia, right upper lung cavitary pneumonia status post VATS, right thoracotomy - Continue IV vancomycin and Zosyn - CTA chest showed large multiloculated thick-walled cavitary mass, moderate to large probably loculated pleural effusion. Chest tube was placed and was closely followed by CT surgery -  patient underwent VATS and right thoracotomy on 4/21, postop day #3 - Continue fentanyl PCA  - CT Surgery following  Active problems  Pneumothorax, right empyema - Status post VATS and right thoracotomy by CT surgery  COPD currently improving, no active wheezing  Hypertension controlled at this time   Chronic benzodiazepine use Continue low dose xanax to avoid withdrawal  Chronic pain issues- states pain is not  well controlled, patient is on oral Dilaudid and oxycodone at home chronically likely causing the narcotic tolerance - Currently on fentanyl PCA  Code Status: full code  Family Communication: Discussed in detail with the patient, all imaging results, lab results explained to the patient  Disposition Plan: When cleared by CT surgery  Time Spent in minutes   25 minutes  Procedures  Chest tube VATS with right thoracotomy 4/21   Consults   Cardiothoracic surgery  DVT Prophylaxis   heparin Medications  Scheduled Meds: . acetaminophen  1,000 mg Oral 4 times per day   Or  . acetaminophen (TYLENOL) oral liquid 160 mg/5 mL  1,000 mg Oral 4 times per day  . ALPRAZolam  0.5 mg Oral TID  . benzonatate  100 mg Oral TID  . bisacodyl  10 mg Oral Daily  . fentaNYL  75 mcg Transdermal Q72H  . fentaNYL   Intravenous 6 times per day  . mometasone-formoterol  2 puff Inhalation BID  . pantoprazole  40 mg Oral Daily  . piperacillin-tazobactam (ZOSYN)  IV  3.375 g Intravenous 3 times per day  . roflumilast  500 mcg Oral Daily  . senna-docusate  1 tablet Oral QHS  .  Umeclidinium Bromide  1 puff Inhalation Q1200  . vancomycin  1,250 mg Intravenous Q8H   Continuous Infusions: . bupivacaine ON-Q pain pump    . dextrose 5 % and 0.45% NaCl 50 mL/hr at 12/20/14 1900   PRN Meds:.diphenhydrAMINE **OR** diphenhydrAMINE, HYDROmorphone, ipratropium-albuterol, ketorolac, naloxone **AND** sodium chloride, ondansetron (ZOFRAN) IV, ondansetron (ZOFRAN) IV, potassium chloride   Antibiotics   Anti-infectives    Start     Dose/Rate Route Frequency Ordered Stop   12/19/14 1645  cefUROXime (ZINACEF) 1.5 g in dextrose 5 % 50 mL IVPB     1.5 g 100 mL/hr over 30 Minutes Intravenous Every 12 hours 12/19/14 1608 12/20/14 0449   12/19/14 0400  cefUROXime (ZINACEF) 1.5 g in dextrose 5 % 50 mL IVPB     1.5 g 100 mL/hr over 30 Minutes Intravenous 60 min pre-op 12/18/14 1943 12/19/14 0435   12/19/14 0230   vancomycin (VANCOCIN) 1,250 mg in sodium chloride 0.9 % 250 mL IVPB     1,250 mg 166.7 mL/hr over 90 Minutes Intravenous Every 8 hours 12/18/14 1750     12/18/14 1800  vancomycin (VANCOCIN) 1,250 mg in sodium chloride 0.9 % 250 mL IVPB     1,250 mg 166.7 mL/hr over 90 Minutes Intravenous STAT 12/18/14 1750 12/18/14 1938   12/18/14 1500  piperacillin-tazobactam (ZOSYN) IVPB 3.375 g     3.375 g 12.5 mL/hr over 240 Minutes Intravenous 3 times per day 12/18/14 1445     12/16/14 1600  vancomycin (VANCOCIN) IVPB 1000 mg/200 mL premix  Status:  Discontinued     1,000 mg 200 mL/hr over 60 Minutes Intravenous Every 8 hours 12/16/14 1508 12/18/14 1749   12/16/14 1500  ceFEPIme (MAXIPIME) 1 g in dextrose 5 % 50 mL IVPB  Status:  Discontinued     1 g 100 mL/hr over 30 Minutes Intravenous Every 8 hours 12/16/14 1430 12/18/14 1422   12/13/14 2200  azithromycin (ZITHROMAX) tablet 250 mg     250 mg Oral Daily at bedtime 12/13/14 0821 12/15/14 2232   12/11/14 2100  azithromycin (ZITHROMAX) 250 mg in dextrose 5 % 125 mL IVPB  Status:  Discontinued     250 mg 125 mL/hr over 60 Minutes Intravenous Every 24 hours 12/11/14 2024 12/13/14 0821   12/11/14 2045  cefTRIAXone (ROCEPHIN) 1 g in dextrose 5 % 50 mL IVPB - Premix  Status:  Discontinued     1 g 100 mL/hr over 30 Minutes Intravenous Every 24 hours 12/11/14 2031 12/16/14 1427        Subjective:   Jason Ballard was seen and examined today. Patient denies dizziness, abdominal pain, N/V/D/C, new weakness, numbess, tingling. No fevers, c/o pain on fentanyl PCA although much better controlled. No fevers or chills.    Objective:   Blood pressure 118/67, pulse 85, temperature 98.3 F (36.8 C), temperature source Oral, resp. rate 24, height 5\' 9"  (1.753 m), weight 75.206 kg (165 lb 12.8 oz), SpO2 95 %.  Wt Readings from Last 3 Encounters:  12/22/14 75.206 kg (165 lb 12.8 oz)     Intake/Output Summary (Last 24 hours) at 12/22/14 1103 Last data  filed at 12/22/14 0730  Gross per 24 hour  Intake   1240 ml  Output   1101 ml  Net    139 ml    Exam  General: Alert and oriented x 3, NAD  HEENT:  PERRLA, EOMI, Anicteic Sclera,  Neck: Supple, no JVD, no masses  CVS: S1 and S2 clear, RRR, tachycardia  Respiratory : Decreased breath sounds  on the right, chest tube +   Extremities: no c/c/e b/l  Neuro: AAOx3, Cr N's II- XII. moving all 4 extremities  Skin: No rashes  Psych: Normal affect and demeanor, alert and oriented x3    Data Review   Micro Results Recent Results (from the past 240 hour(s))  Culture, blood (routine x 2)     Status: None (Preliminary result)   Collection Time: 12/17/14  1:35 PM  Result Value Ref Range Status   Specimen Description BLOOD LEFT ARM  Final   Special Requests BOTTLES DRAWN AEROBIC ONLY 2CC  Final   Culture   Final           BLOOD CULTURE RECEIVED NO GROWTH TO DATE CULTURE WILL BE HELD FOR 5 DAYS BEFORE ISSUING A FINAL NEGATIVE REPORT Note: Culture results may be compromised due to an inadequate volume of blood received in culture bottles. Performed at [facility]    Report Status PENDING  Incomplete  Culture, blood (routine x 2)     Status: None (Preliminary result)   Collection Time: 12/17/14  1:45 PM  Result Value Ref Range Status   Specimen Description BLOOD BLOOD LEFT WRIST  Final   Special Requests BOTTLES DRAWN AEROBIC ONLY 3CC  Final   Culture   Final           BLOOD CULTURE RECEIVED NO GROWTH TO DATE CULTURE WILL BE HELD FOR 5 DAYS BEFORE ISSUING A FINAL NEGATIVE REPORT Note: Culture results may be compromised due to an inadequate volume of blood received in culture bottles. Performed at [facility]    Report Status PENDING  Incomplete  Anaerobic culture     Status: None (Preliminary result)   Collection Time: 12/19/14 11:02 AM  Result Value Ref Range Status   Specimen Description FLUID RIGHT PLEURAL  Final   Special Requests PATIENT ON FOLLOWING  ZINACEF VANCOMYCIN ZOSYN  Final   Gram Stain   Final    RARE WBC PRESENT, PREDOMINANTLY PMN NO ORGANISMS SEEN Performed at [facility]    Culture   Final    NO ANAEROBES ISOLATED; CULTURE IN PROGRESS FOR 5 DAYS Performed at [facility]    Report Status PENDING  Incomplete  Body fluid culture     Status: None (Preliminary result)   Collection Time: 12/19/14 11:02 AM  Result Value Ref Range Status   Specimen Description FLUID RIGHT PLEURAL  Final   Special Requests PATIENT ON FOLLOWING ZINACEF VANCOMYCIN ZOSYN  Final   Gram Stain   Final    RARE WBC PRESENT, PREDOMINANTLY PMN NO ORGANISMS SEEN Performed at [facility]    Culture   Final    NO GROWTH 2 DAYS Performed at [facility]    Report Status PENDING  Incomplete    Radiology Reports Dg Chest 2 View  12/17/2014   CLINICAL DATA:  49 year old male with shortness of breath and cough. Cavitary right upper lobe pneumonia. Right chest tube. Initial encounter.  EXAM: CHEST  2 VIEW  COMPARISON:  12/16/2014 and earlier.  FINDINGS: Right chest tube in place with moderate volume right chest wall and neck subcutaneous emphysema. Small right pneumothorax, pleural edge now visible in the apex.  Since 12/09/2014 mildly regressed right upper lobe airspace opacity, but increased right lung base opacity. Right pleural effusion evident on 12/11/2014 CT. The left lung is stable and clear. Stable cardiac size and mediastinal contours. Visualized tracheal air column is within  normal limits. No acute osseous abnormality identified.  IMPRESSION: 1. Stable right chest tube with moderate volume right chest wall and neck subcutaneous emphysema. 2. Recurrent small right apical pneumothorax. 3. Mildly regressed right upper lobe pneumonia since 12/09/2014, but worsening right lung base opacity in part due to pleural effusion.   Electronically Signed   By: Genevie Ann M.D.   On: 12/17/2014 08:04   Ct Angio Chest Pe W/cm &/or Wo  Cm  12/17/2014   CLINICAL DATA:  Patient has history of COPD, asthma, presented from [facility] with acute respiratory failure. He was admitted to [facility] on 4/10 with a right middle lobe pneumonia. with Zithromax and cT chest revealed a cavitating pneumonia and subsequently patient developed pneumothorax. Chest tube was placed on 4/11 and patient was placed on BiPAP  EXAM: CT ANGIOGRAPHY CHEST WITH CONTRAST  TECHNIQUE: Multidetector CT imaging of the chest was performed using the standard protocol during bolus administration of intravenous contrast. Multiplanar CT image reconstructions and MIPs were obtained to evaluate the vascular anatomy.  CONTRAST:  30mL OMNIPAQUE IOHEXOL 350 MG/ML SOLN  COMPARISON:  Numerous recent prior studies  FINDINGS: No filling defects in the pulmonary arterial system. Thoracic aorta shows no dissection or dilatation. No significant hilar or mediastinal adenopathy. Mild reactive adenopathy is present.  Thoracic inlet is normal. Trace fluid in the pericardium present. No left effusion. On the right, there is a pleural effusion with numerous air-fluid levels within it suggesting infection. Numerous air-fluid levels are seen at various heights within the moderate to large right pleural effusion.  Left lung is clear. On the right, there is a cavitating mass posteriorly in the inferior aspect of the right upper lobe. There are numerous air-filled cavitary areas within this mass, with its base along the posterior fissure. The mass measures 76 by 42 by 59 mm. There is less airspace infiltrate surrounding the thick-walled cavitary mass currently.  There is a right chest to which enters the thorax anterior laterally in the right middle lobe and then move cranially to terminate within the pleural space near the apex. There is a large volume of subcutaneous emphysema in the right thorax. Other than the air contained within the pleural effusion, there is no significant  pneumothorax.  Probable flash fill hemangioma right liver stable. No compression deformities.  Review of the MIP images confirms the above findings.  IMPRESSION: When compared to the prior CT scan performed 12/11/2014, the volume infiltrate throughout posterior segment of the right upper lobe has decreased, but the previous pneumonia is now largely replaced by a large multiloculated thick walled cavitary mass. Adjacent to this there is a moderate to large probably loculated pleural effusion posteriorly, with which the right chest tube does not communicate. Numerous pockets of gas within the effusion suggest empyema and possibility of bronchopleural fistula.  Large volume subcutaneous emphysema on the right has increased.   Electronically Signed   By: Skipper Cliche M.D.   On: 12/17/2014 16:24   Dg Chest Port 1 View  12/22/2014   CLINICAL DATA:  Chest tube placement  EXAM: PORTABLE CHEST - 1 VIEW  COMPARISON:  12/21/2014  FINDINGS: Right-sided chest tubes in place with tips over the right lung apex laterally and medially, respectively. No pneumothorax. Extensive right subcutaneous emphysema. Right IJ central line tip terminates over the upper SVC. Borderline cardiomegaly noted without edema. Left lung is clear. Trace right pleural fluid/ thickening.  IMPRESSION: Stable findings as above.   Electronically Signed   By: Elzie Rings  Green M.D.   On: 12/22/2014 09:44   Dg Chest Port 1 View  12/21/2014   CLINICAL DATA:  One day postop thoracoscopy for empyema in the right posterior pleural space.  EXAM: PORTABLE CHEST - 1 VIEW  COMPARISON:  Portable chest x-rays yesterday and earlier.  FINDINGS: Cardiomediastinal silhouette unremarkable, unchanged. Three right chest tubes in place with no pneumothorax. Subcutaneous emphysema in the right chest wall and right neck, unchanged. Abscess in the right upper lobe, unchanged. No visible residual empyema in the right lower chest. Left lung remains clear.  IMPRESSION: 1. 3  right chest tubes in place with no pneumothorax. 2. Stable right upper lobe abscess. 3. No visible residual empyema in the right lower chest. 4. Stable subcutaneous emphysema in the right chest wall and right neck. 5. No new abnormalities.   Electronically Signed   By: Evangeline Dakin M.D.   On: 12/21/2014 09:19   Dg Chest Port 1 View  12/20/2014   CLINICAL DATA:  Status post empyema drainage on the right ; acute on chronic respiratory failure, pneumonia.  EXAM: PORTABLE CHEST - 1 VIEW  COMPARISON:  Portable chest x-ray of December 19, 2014 and CT scan of the chest dated December 17, 2014.  FINDINGS: The lungs are well-expanded. The 3 right-sided chest tubes are unchanged in positioning. There is no pneumothorax. There is a moderate amount of subcutaneous emphysema in the right axillary region and at the base of the right neck. No significant pleural effusion is demonstrated. There are cavities in the posterior inferior aspect of the right upper lobe. The left lung is clear. The heart and pulmonary vascularity are within the limits of normal. The right internal jugular venous catheter tip projects over the midportion of the SVC.  IMPRESSION: No residual right pleural effusion and no evidence of a pneumothorax. The 3 chest tubes on the right are unchanged in appearance. Persistent cavitary lesions inferiorly in the right upper lobe are noted.   Electronically Signed   By: David  Martinique M.D.   On: 12/20/2014 07:33   Dg Chest Port 1 View  12/19/2014   CLINICAL DATA:  Status post right thoracotomy  EXAM: PORTABLE CHEST - 1 VIEW  COMPARISON:  12/18/2014  FINDINGS: Cardiac shadow is stable. A right jugular central line is seen in satisfactory position. Three thoracostomy catheters are noted. No pneumothorax is seen. No significant residual empyema is seen. The left lung remains clear.  IMPRESSION: Status post chest tube placement without pneumothorax. No significant residual fluid collection is seen.   Electronically  Signed   By: Inez Catalina M.D.   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 On the right there is persistent increased density in the perihilar and infrahilar region. No pneumothorax is visible today. The right chest tube tip projects over the lateral aspect of the second rib. Considerable subcutaneous emphysema on the right persists. There is no mediastinal shift. The cardiac silhouette and pulmonary vascularity are normal.  IMPRESSION: The small right apical pneumothorax is not visible today. There is persistent density in the right hemi thorax consistent with known empyema.   Electronically Signed   By: David  Martinique M.D.   On: 12/18/2014 07:31  Dg Chest Port 1 View  12/16/2014   CLINICAL DATA:  Pneumothorax follow-up, acute and chronic respiratory failure, long history of tobacco use now discontinued.  EXAM: PORTABLE CHEST - 1 VIEW  COMPARISON:  Portable chest x-ray of December 15, 2014  FINDINGS: There remains a large amount of subcutaneous emphysema in the right axillary region and base of the neck. Two right-sided chest tubes are present and unchanged in position. No pneumothorax is visible. Confluent alveolar opacities persist in the right perihilar region with areas of lucency consistent with known cavitation. Increased right infrahilar alveolar opacities are noted as well. There is no significant pleural effusion. There is no mediastinal shift. The left lung is clear. The heart and pulmonary vascularity are normal.  IMPRESSION: 1. No pneumothorax is visible today. The chest tubes are unchanged in position. 2. Known cavitary pneumonia on the right, unchanged. Increasing right basilar airspace disease.    Electronically Signed   By: David  Martinique   On: 12/16/2014 07:26   Dg Chest Port 1 View  12/15/2014   CLINICAL DATA:  Pneumothorax  EXAM: PORTABLE CHEST - 1 VIEW  COMPARISON:  12/14/2014  FINDINGS: Unchanged positioning of the right-sided chest tube. A trace right apical pneumothorax is present, 5% or less. Extensive right chest wall emphysema is stable.  The cavitary pneumonia in the right upper lobe it is again noted. Clear left chest. Normal heart size.  IMPRESSION: 1. Trace right apical pneumothorax with unchanged chest tube positioning. 2. Cavitary right upper lobe pneumonia.   Electronically Signed   By: Monte Fantasia M.D.   On: 12/15/2014 06:08   Dg Chest Port 1 View  12/14/2014   CLINICAL DATA:  Status post chest tube placement  EXAM: PORTABLE CHEST - 1 VIEW  COMPARISON:  12/13/2014  FINDINGS: Cardiac shadow is within normal limits. The left lung is clear. A right-sided chest tube is again identified. No definitive pneumothorax is seen. Considerable subcutaneous emphysema is again noted. Mild right upper lobe cavitary lesion is again identified and stable.  IMPRESSION: No right-sided pneumothorax. The overall appearance is stable from the prior exam.   Electronically Signed   By: Inez Catalina M.D.   On: 12/14/2014 07:14   Dg Chest Port 1 View  12/13/2014   CLINICAL DATA:  Right pneumothorax.  Chest tube.  EXAM: PORTABLE CHEST - 1 VIEW  COMPARISON:  12/12/2014  FINDINGS: The right pneumothorax has resolved. Decreased subcutaneous emphysema on the right. Chest tube remains in good position. Improving cavitary infiltrate in the right upper lobe. Slight progression of new atelectasis at the left lung base laterally. Heart size and pulmonary vascularity are normal.  IMPRESSION: 1. Resolution of right pneumothorax. 2. Improving cavitary infiltrate in the right upper lobe. 3. Slightly increasing left base atelectasis laterally.   Electronically Signed   By: Lorriane Shire M.D.   On: 12/13/2014 07:52    Portable Chest 1 View  12/12/2014   CLINICAL DATA:  Followup pneumonia.  EXAM: PORTABLE CHEST - 1 VIEW  COMPARISON:  12/11/2014  FINDINGS: Since the previous day's study, a small right pneumothorax has become apparent, estimated at 20%. Right-sided chest tube is stable. Significant right-sided subcutaneous emphysema is also without substantial change.  Airspace consolidation in right upper lobe extending from the right hilum is similar to the prior exam. Reticular opacities are noted in both lower lungs, increased from the previous day's study, likely atelectasis.  No left pneumothorax.  No convincing pleural effusion.  IMPRESSION: 1. Approximately 20% right-sided pneumothorax, new  from the previous day's study. 2. Stable right upper lobe consolidation. 3. Mild increase in lung base reticular opacity most likely atelectasis. No other change.   Electronically Signed   By: Lajean Manes M.D.   On: 12/12/2014 08:02   Dg Chest Port 1 View  12/11/2014   CLINICAL DATA:  Pneumonia.  COPD.  EXAM: PORTABLE CHEST - 1 VIEW  COMPARISON:  12/11/2014.  FINDINGS: The right-sided chest tube is stable. Interval worsening of subcutaneous emphysema suggesting an air leak. No obvious right-sided pneumothorax. Persistent right mid lung infiltrate. The left lung is clear.  IMPRESSION: Stable chest tube but progressive subcutaneous emphysema on the right side. No obvious pneumothorax.   Electronically Signed   By: Marijo Sanes M.D.   On: 12/11/2014 19:30    CBC  Recent Labs Lab 12/17/14 0550 12/18/14 2045 12/20/14 0415 12/21/14 0320 12/22/14 0434  WBC 38.3* 16.7* 8.3 7.3 8.9  HGB 15.7 14.0 11.4* 11.3* 11.5*  HCT 45.9 42.7 35.3* 35.3* 37.0*  PLT PLATELET CLUMPS NOTED ON SMEAR, COUNT APPEARS ADEQUATE 330 316 370 393  MCV 90.5 92.0 92.4 93.1 95.6  MCH 31.0 30.2 29.8 29.8 29.7  MCHC 34.2 32.8 32.3 32.0 31.1  RDW 13.8 14.0 13.9 14.2 14.3    Chemistries   Recent Labs Lab 12/16/14 0305 12/18/14 2045  12/20/14 0415 12/21/14 0320 12/22/14 0434  NA 136 130* 136 135 137  K 4.2 4.9 4.3 4.1 4.0  CL 98 92* 96 96 96  CO2 29 29 35* 34* 35*  GLUCOSE 104* 104* 103* 94 89  BUN 11 13 10 10 9   CREATININE 0.67 0.80 0.68 0.72 0.66  CALCIUM 7.9* 8.0* 7.5* 7.5* 7.5*  AST 27 39*  --  61*  --   ALT 53 41  --  37  --   ALKPHOS 72 88  --  72  --   BILITOT 0.7 1.2  --  0.8  --    ------------------------------------------------------------------------------------------------------------------ estimated creatinine clearance is 111.7 mL/min (by C-G formula based on Cr of 0.66). ------------------------------------------------------------------------------------------------------------------ No results for input(s): HGBA1C in the last 72 hours. ------------------------------------------------------------------------------------------------------------------ No results for input(s): CHOL, HDL, LDLCALC, TRIG, CHOLHDL, LDLDIRECT in the last 72 hours. ------------------------------------------------------------------------------------------------------------------ No results for input(s): TSH, T4TOTAL, T3FREE, THYROIDAB in the last 72 hours.  Invalid input(s): FREET3 ------------------------------------------------------------------------------------------------------------------ No results for input(s): VITAMINB12, FOLATE, FERRITIN, TIBC, IRON, RETICCTPCT in the last 72 hours.  Coagulation profile  Recent Labs Lab 12/18/14 2045  INR 1.09    No results for input(s): DDIMER in the last 72 hours.  Cardiac Enzymes No results for input(s): CKMB, TROPONINI, MYOGLOBIN in the last 168 hours.  Invalid input(s): CK ------------------------------------------------------------------------------------------------------------------ Invalid input(s): POCBNP  No results for input(s): GLUCAP in the last 72 hours.   RAI,RIPUDEEP M.D. [facility] 12/22/2014, 11:03 AM  Pager: [phone]   Between 7am  to 7pm - call Pager - [phone]  After 7pm go to [email or web address] - password [email or web address]  Call night coverage person covering after 7pm

## 2014-12-22 NOTE — Progress Notes (Signed)
Removed anterior chest tube with Lowanda Foster RN per MD order.  Pt had no complaints and tube was removed without difficulty.  Since we had a problem with the base suture, (PA was notified) steristrips were placed over site followed by vaseline gauze and dry gauze.  Other chest tube site and tube still present continues some minimal drainage around site.  This was all reinforced by an abd pad.

## 2014-12-22 NOTE — Progress Notes (Signed)
Utilization review completed.  

## 2014-12-23 ENCOUNTER — Inpatient Hospital Stay (HOSPITAL_COMMUNITY): Payer: Medicare Other

## 2014-12-23 LAB — CBC
HCT: 35.3 % — ABNORMAL LOW (ref 39.0–52.0)
Hemoglobin: 11.2 g/dL — ABNORMAL LOW (ref 13.0–17.0)
MCH: 30.2 pg (ref 26.0–34.0)
MCHC: 31.7 g/dL (ref 30.0–36.0)
MCV: 95.1 fL (ref 78.0–100.0)
Platelets: 425 10*3/uL — ABNORMAL HIGH (ref 150–400)
RBC: 3.71 MIL/uL — ABNORMAL LOW (ref 4.22–5.81)
RDW: 14 % (ref 11.5–15.5)
WBC: 9.4 10*3/uL (ref 4.0–10.5)

## 2014-12-23 LAB — CULTURE, BLOOD (ROUTINE X 2)
CULTURE: NO GROWTH
Culture: NO GROWTH

## 2014-12-23 LAB — COMPREHENSIVE METABOLIC PANEL
ALT: 40 U/L (ref 0–53)
AST: 56 U/L — ABNORMAL HIGH (ref 0–37)
Albumin: 1.7 g/dL — ABNORMAL LOW (ref 3.5–5.2)
Alkaline Phosphatase: 83 U/L (ref 39–117)
Anion gap: 7 (ref 5–15)
BUN: <5 mg/dL — ABNORMAL LOW (ref 6–23)
CO2: 36 mmol/L — ABNORMAL HIGH (ref 19–32)
Calcium: 7.9 mg/dL — ABNORMAL LOW (ref 8.4–10.5)
Chloride: 95 mmol/L — ABNORMAL LOW (ref 96–112)
Creatinine, Ser: 0.64 mg/dL (ref 0.50–1.35)
GFR calc Af Amer: >90 mL/min (ref >90)
GFR calc non Af Amer: >90 mL/min (ref >90)
Glucose, Bld: 100 mg/dL — ABNORMAL HIGH (ref 70–99)
Potassium: 3.6 mmol/L (ref 3.5–5.1)
Sodium: 138 mmol/L (ref 135–145)
Total Bilirubin: 0.5 mg/dL (ref 0.3–1.2)
Total Protein: 5.6 g/dL — ABNORMAL LOW (ref 6.0–8.3)

## 2014-12-23 MED ORDER — FENTANYL 75 MCG/HR TD PT72
75.0000 ug | MEDICATED_PATCH | TRANSDERMAL | Status: DC
Start: 2014-12-23 — End: 2014-12-24
  Administered 2014-12-23: 75 ug via TRANSDERMAL
  Filled 2014-12-23: qty 1

## 2014-12-23 MED ORDER — OXYCODONE-ACETAMINOPHEN 5-325 MG PO TABS
2.0000 | ORAL_TABLET | Freq: Four times a day (QID) | ORAL | Status: DC | PRN
Start: 2014-12-23 — End: 2014-12-24
  Administered 2014-12-23 – 2014-12-24 (×2): 2 via ORAL
  Filled 2014-12-23 (×2): qty 2

## 2014-12-23 MED ORDER — OXYCODONE-ACETAMINOPHEN 5-325 MG PO TABS: 2.0000 | ORAL_TABLET | Freq: Four times a day (QID) | ORAL | Status: DC | PRN

## 2014-12-23 MED ORDER — BENZONATATE 100 MG PO CAPS: 100.0000 mg | ORAL_CAPSULE | Freq: Three times a day (TID) | ORAL | Status: DC | PRN

## 2014-12-23 MED ORDER — FENTANYL 75 MCG/HR TD PT72: 75.0000 ug | MEDICATED_PATCH | TRANSDERMAL | Status: DC

## 2014-12-23 MED ORDER — AMOXICILLIN-POT CLAVULANATE 875-125 MG PO TABS
1.0000 | ORAL_TABLET | Freq: Two times a day (BID) | ORAL | Status: DC
  Administered 2014-12-23 – 2014-12-24 (×3): 1 via ORAL
  Filled 2014-12-23 (×4): qty 1

## 2014-12-23 MED ORDER — BISACODYL 5 MG PO TBEC: 10.0000 mg | DELAYED_RELEASE_TABLET | Freq: Every day | ORAL | Status: DC

## 2014-12-23 MED ORDER — AMOXICILLIN-POT CLAVULANATE 875-125 MG PO TABS: 1.0000 | ORAL_TABLET | Freq: Two times a day (BID) | ORAL | Status: DC

## 2014-12-23 MED ORDER — OXYCODONE-ACETAMINOPHEN 10-325 MG PO TABS: 1.0000 | ORAL_TABLET | Freq: Every day | ORAL | Status: DC

## 2014-12-23 MED ORDER — ALPRAZOLAM 1 MG PO TABS: 1.0000 mg | ORAL_TABLET | Freq: Two times a day (BID) | ORAL | Status: DC | PRN

## 2014-12-23 MED ORDER — SENNOSIDES-DOCUSATE SODIUM 8.6-50 MG PO TABS: 1.0000 | ORAL_TABLET | Freq: Every evening | ORAL | Status: DC | PRN

## 2014-12-23 NOTE — Progress Notes (Signed)
Fentanyl 10 mcg/mL PCA injection stopped per MD order.  38mcg discarded/wasted in the sink by two RNs.  Unable to find in pyxis.  Isabelle Course, RN and Blenda Peals, RN.

## 2014-12-23 NOTE — Progress Notes (Signed)
[facility]                                                                              Patient Demographics  Jason Ballard, is a 49 y.o. male, DOB - 09/23/1965, TIW:[id number]  Admit date - 12/11/2014   Admitting Physician Juanito Doom, MD  Outpatient Primary MD for the patient is HAGUE, Rosalyn Charters, MD  LOS - 12    chief complaint : Shortness of breath      Brief HPI   Patient is a 49 year old male with history of COPD, asthma, presented from [facility] with acute respiratory failure. He was admitted to [facility] on 4/10 with a right middle lobe pneumonia. Patient was treated with Zithromax and Rocephin. CT chest revealed a cavitating pneumonia and subsequently patient developed pneumothorax. Chest tube was placed on 4/11 and patient was placed on BiPAP while at the OSH which caused a chest tube leak and subsequent subcutaneous emphysema. Sputum culture and blood cultures obtained at the OSH were all confimed negative on 4/13. Patient was transferred to [facility] for right chest tube and possible VATS.    Assessment & Plan    Principal Problems: Acute on chronic respiratory failure due to severe cavitary community-acquired pneumonia, right upper lung cavitary pneumonia status post VATS, right thoracotomy, postop day #4 - CTA chest showed large multiloculated thick-walled cavitary mass, moderate to large probably loculated pleural effusion. Chest tube was placed and was closely followed by CT surgery. Patient underwent VATS and right thoracotomy on 4/21, postop day #4 - CT surgery discontinued chest tube 1 on 4/24, pending final chest tube removal pending - Discontinued IV vancomycin and Zosyn, discussed with Dr. Talbot Grumbling, ID, recommended oral Augmentin for one month and then repeat chest x-ray. Patient will need outpatient close follow-up with PCP or CT surgery. -PCA discontinued by CT surgery    Active problems Pneumothorax, right empyema -  Status post VATS and right thoracotomy by CT surgery  COPD currently improving, no active wheezing  Hypertension controlled at this time   Chronic benzodiazepine use Continue low dose xanax to avoid withdrawal  Chronic pain issues- states pain is not well controlled, patient is on oral Dilaudid and oxycodone at home chronically likely causing the narcotic tolerance -Patient was started on fentanyl pca this morning at the time of my examination, plan to discontinue today   Code Status: full code  Family Communication: Discussed in detail with the patient, all imaging results, lab results explained to the patientAnd wife  Disposition Plan:Hoping to DC home today if if cleared by CT surgery.   Time Spent in minutes   25 minutes  Procedures  Chest tube VATS with right thoracotomy 4/21   Consults   Cardiothoracic surgery  DVT Prophylaxis   heparin Medications  Scheduled Meds: . acetaminophen  1,000 mg Oral 4 times per day   Or  . acetaminophen (TYLENOL) oral liquid 160 mg/5 mL  1,000 mg Oral 4 times per day  . ALPRAZolam  0.5 mg Oral TID  . amoxicillin-clavulanate  1 tablet Oral Q12H  . benzonatate  100 mg Oral TID  . bisacodyl  10 mg Oral Daily  . fentaNYL  75 mcg Transdermal Q72H  . mometasone-formoterol  2 puff Inhalation BID  . pantoprazole  40 mg Oral Daily  . roflumilast  500 mcg Oral Daily  . senna-docusate  1 tablet Oral QHS  . Umeclidinium Bromide  1 puff Inhalation Q1200   Continuous Infusions:   PRN Meds:.ipratropium-albuterol, ketorolac, ondansetron (ZOFRAN) IV, oxyCODONE-acetaminophen, potassium chloride, sodium chloride   Antibiotics   Anti-infectives    Start     Dose/Rate Route Frequency Ordered Stop   12/23/14 1200  amoxicillin-clavulanate (AUGMENTIN) 875-125 MG per tablet 1 tablet     1 tablet Oral Every 12 hours 12/23/14 1103     12/19/14 1645  cefUROXime (ZINACEF) 1.5 g in dextrose 5 % 50 mL IVPB     1.5 g 100 mL/hr over 30 Minutes  Intravenous Every 12 hours 12/19/14 1608 12/20/14 0449   12/19/14 0400  cefUROXime (ZINACEF) 1.5 g in dextrose 5 % 50 mL IVPB     1.5 g 100 mL/hr over 30 Minutes Intravenous 60 min pre-op 12/18/14 1943 12/19/14 0435   12/19/14 0230  vancomycin (VANCOCIN) 1,250 mg in sodium chloride 0.9 % 250 mL IVPB  Status:  Discontinued     1,250 mg 166.7 mL/hr over 90 Minutes Intravenous Every 8 hours 12/18/14 1750 12/23/14 1103   12/18/14 1800  vancomycin (VANCOCIN) 1,250 mg in sodium chloride 0.9 % 250 mL IVPB     1,250 mg 166.7 mL/hr over 90 Minutes Intravenous STAT 12/18/14 1750 12/18/14 1938   12/18/14 1500  piperacillin-tazobactam (ZOSYN) IVPB 3.375 g  Status:  Discontinued     3.375 g 12.5 mL/hr over 240 Minutes Intravenous 3 times per day 12/18/14 1445 12/23/14 1103   12/16/14 1600  vancomycin (VANCOCIN) IVPB 1000 mg/200 mL premix  Status:  Discontinued     1,000 mg 200 mL/hr over 60 Minutes Intravenous Every 8 hours 12/16/14 1508 12/18/14 1749   12/16/14 1500  ceFEPIme (MAXIPIME) 1 g in dextrose 5 % 50 mL IVPB  Status:  Discontinued     1 g 100 mL/hr over 30 Minutes Intravenous Every 8 hours 12/16/14 1430 12/18/14 1422   12/13/14 2200  azithromycin (ZITHROMAX) tablet 250 mg     250 mg Oral Daily at bedtime 12/13/14 0821 12/15/14 2232   12/11/14 2100  azithromycin (ZITHROMAX) 250 mg in dextrose 5 % 125 mL IVPB  Status:  Discontinued     250 mg 125 mL/hr over 60 Minutes Intravenous Every 24 hours 12/11/14 2024 12/13/14 0821   12/11/14 2045  cefTRIAXone (ROCEPHIN) 1 g in dextrose 5 % 50 mL IVPB - Premix  Status:  Discontinued     1 g 100 mL/hr over 30 Minutes Intravenous Every 24 hours 12/11/14 2031 12/16/14 1427        Subjective:   Jason Ballard was seen and examined today. Patient denies dizziness, abdominal pain, N/V/D/C, new weakness, numbess, tingling. No fevers. Pain somewhat controlled, frustrated with his PCA as it keeps beeping. Hoping to go home today if okay with CT  surgery   Objective:   Blood pressure 118/61, pulse 70, temperature 98.4 F (36.9 C), temperature source Oral, resp. rate 20, height 5\' 9"  (1.753 m), weight 74.3 kg (163 lb 12.8 oz), SpO2 98 %.  Wt Readings from Last 3 Encounters:  12/23/14 74.3 kg (163 lb 12.8 oz)     Intake/Output Summary (Last 24 hours) at 12/23/14 1232 Last data filed at 12/23/14 0855  Gross per 24 hour  Intake  3012.83 ml  Output   1450 ml  Net 1562.83 ml    Exam  General: Alert and oriented x 3, NAD  HEENT:  PERRLA, EOMI  Neck: Supple, no JVD  CVS: S1S2 clear, no mrg  Respiratory : Decreased breath sounds  on the right, chest tube +   Abdomen : soft, Nontender, nondistended, normal bowel sounds   Extremities: no c/c/e b/l  Neuro: AAOx3, Cr N's II- XII. moving all 4 extremities  Skin: No rashes  Psych: Normal affect and demeanor, alert and oriented x3    Data Review   Micro Results Recent Results (from the past 240 hour(s))  Culture, blood (routine x 2)     Status: None   Collection Time: 12/17/14  1:35 PM  Result Value Ref Range Status   Specimen Description BLOOD LEFT ARM  Final   Special Requests BOTTLES DRAWN AEROBIC ONLY 2CC  Final   Culture   Final    NO GROWTH 5 DAYS Note: Culture results may be compromised due to an inadequate volume of blood received in culture bottles. Performed at [facility]    Report Status 12/23/2014 FINAL  Final  Culture, blood (routine x 2)     Status: None   Collection Time: 12/17/14  1:45 PM  Result Value Ref Range Status   Specimen Description BLOOD BLOOD LEFT WRIST  Final   Special Requests BOTTLES DRAWN AEROBIC ONLY 3CC  Final   Culture   Final    NO GROWTH 5 DAYS Note: Culture results may be compromised due to an inadequate volume of blood received in culture bottles. Performed at [facility]    Report Status 12/23/2014 FINAL  Final  Anaerobic culture     Status: None (Preliminary result)   Collection Time: 12/19/14  11:02 AM  Result Value Ref Range Status   Specimen Description FLUID RIGHT PLEURAL  Final   Special Requests PATIENT ON FOLLOWING ZINACEF VANCOMYCIN ZOSYN  Final   Gram Stain   Final    RARE WBC PRESENT, PREDOMINANTLY PMN NO ORGANISMS SEEN Performed at [facility]    Culture   Final    NO ANAEROBES ISOLATED; CULTURE IN PROGRESS FOR 5 DAYS Performed at [facility]    Report Status PENDING  Incomplete  Body fluid culture     Status: None   Collection Time: 12/19/14 11:02 AM  Result Value Ref Range Status   Specimen Description FLUID RIGHT PLEURAL  Final   Special Requests PATIENT ON FOLLOWING ZINACEF VANCOMYCIN ZOSYN  Final   Gram Stain   Final    RARE WBC PRESENT, PREDOMINANTLY PMN NO ORGANISMS SEEN Performed at [facility]    Culture   Final    NO GROWTH 3 DAYS Performed at [facility]    Report Status 12/22/2014 FINAL  Final    Radiology Reports Dg Chest 2 View  12/17/2014   CLINICAL DATA:  49 year old male with shortness of breath and cough. Cavitary right upper lobe pneumonia. Right chest tube. Initial encounter.  EXAM: CHEST  2 VIEW  COMPARISON:  12/16/2014 and earlier.  FINDINGS: Right chest tube in place with moderate volume right chest wall and neck subcutaneous emphysema. Small right pneumothorax, pleural edge now visible in the apex.  Since 12/09/2014 mildly regressed right upper lobe airspace opacity, but increased right lung base opacity. Right pleural effusion evident on 12/11/2014 CT. The left lung is stable and clear. Stable cardiac size and mediastinal contours. Visualized tracheal air column  is within normal limits. No acute osseous abnormality identified.  IMPRESSION: 1. Stable right chest tube with moderate volume right chest wall and neck subcutaneous emphysema. 2. Recurrent small right apical pneumothorax. 3. Mildly regressed right upper lobe pneumonia since 12/09/2014, but worsening right lung base opacity in part due to  pleural effusion.   Electronically Signed   By: Genevie Ann M.D.   On: 12/17/2014 08:04   Ct Angio Chest Pe W/cm &/or Wo Cm  12/17/2014   CLINICAL DATA:  Patient has history of COPD, asthma, presented from [facility] with acute respiratory failure. He was admitted to [facility] on 4/10 with a right middle lobe pneumonia. with Zithromax and cT chest revealed a cavitating pneumonia and subsequently patient developed pneumothorax. Chest tube was placed on 4/11 and patient was placed on BiPAP  EXAM: CT ANGIOGRAPHY CHEST WITH CONTRAST  TECHNIQUE: Multidetector CT imaging of the chest was performed using the standard protocol during bolus administration of intravenous contrast. Multiplanar CT image reconstructions and MIPs were obtained to evaluate the vascular anatomy.  CONTRAST:  26mL OMNIPAQUE IOHEXOL 350 MG/ML SOLN  COMPARISON:  Numerous recent prior studies  FINDINGS: No filling defects in the pulmonary arterial system. Thoracic aorta shows no dissection or dilatation. No significant hilar or mediastinal adenopathy. Mild reactive adenopathy is present.  Thoracic inlet is normal. Trace fluid in the pericardium present. No left effusion. On the right, there is a pleural effusion with numerous air-fluid levels within it suggesting infection. Numerous air-fluid levels are seen at various heights within the moderate to large right pleural effusion.  Left lung is clear. On the right, there is a cavitating mass posteriorly in the inferior aspect of the right upper lobe. There are numerous air-filled cavitary areas within this mass, with its base along the posterior fissure. The mass measures 76 by 42 by 59 mm. There is less airspace infiltrate surrounding the thick-walled cavitary mass currently.  There is a right chest to which enters the thorax anterior laterally in the right middle lobe and then move cranially to terminate within the pleural space near the apex. There is a large volume of subcutaneous  emphysema in the right thorax. Other than the air contained within the pleural effusion, there is no significant pneumothorax.  Probable flash fill hemangioma right liver stable. No compression deformities.  Review of the MIP images confirms the above findings.  IMPRESSION: When compared to the prior CT scan performed 12/11/2014, the volume infiltrate throughout posterior segment of the right upper lobe has decreased, but the previous pneumonia is now largely replaced by a large multiloculated thick walled cavitary mass. Adjacent to this there is a moderate to large probably loculated pleural effusion posteriorly, with which the right chest tube does not communicate. Numerous pockets of gas within the effusion suggest empyema and possibility of bronchopleural fistula.  Large volume subcutaneous emphysema on the right has increased.   Electronically Signed   By: Skipper Cliche M.D.   On: 12/17/2014 16:24   Dg Chest Port 1 View  12/23/2014   CLINICAL DATA:  Video-assisted thoracoscopy for empyema drainage on April 21st, interval removal of 1 chest tube.  EXAM: PORTABLE CHEST - 1 VIEW  COMPARISON:  Portable chest x-ray of December 22, 2014  FINDINGS: The right lateral chest tube has been removed. The medial chest tube is unchanged. There is no pneumothorax or significant pleural effusion. There is persistent patchy increased density in the right suprahilar region. There remains subcutaneous emphysema on the right. The  mediastinum is normal in width. The left lung is clear. The heart is normal in size. The pulmonary vascularity is not engorged. The right internal jugular venous catheter tip projects over the midportion of the SVC.  IMPRESSION: 1. No acute change following right lateral chest tube removal. 2. Persistent patchy increased density in the right suprahilar region in the area of known cavitary disease.   Electronically Signed   By: David  Martinique M.D.   On: 12/23/2014 07:24   Dg Chest Port 1  View  12/22/2014   CLINICAL DATA:  Chest tube placement  EXAM: PORTABLE CHEST - 1 VIEW  COMPARISON:  12/21/2014  FINDINGS: Right-sided chest tubes in place with tips over the right lung apex laterally and medially, respectively. No pneumothorax. Extensive right subcutaneous emphysema. Right IJ central line tip terminates over the upper SVC. Borderline cardiomegaly noted without edema. Left lung is clear. Trace right pleural fluid/ thickening.  IMPRESSION: Stable findings as above.   Electronically Signed   By: Conchita Paris M.D.   On: 12/22/2014 09:44   Dg Chest Port 1 View  12/21/2014   CLINICAL DATA:  One day postop thoracoscopy for empyema in the right posterior pleural space.  EXAM: PORTABLE CHEST - 1 VIEW  COMPARISON:  Portable chest x-rays yesterday and earlier.  FINDINGS: Cardiomediastinal silhouette unremarkable, unchanged. Three right chest tubes in place with no pneumothorax. Subcutaneous emphysema in the right chest wall and right neck, unchanged. Abscess in the right upper lobe, unchanged. No visible residual empyema in the right lower chest. Left lung remains clear.  IMPRESSION: 1. 3 right chest tubes in place with no pneumothorax. 2. Stable right upper lobe abscess. 3. No visible residual empyema in the right lower chest. 4. Stable subcutaneous emphysema in the right chest wall and right neck. 5. No new abnormalities.   Electronically Signed   By: Evangeline Dakin M.D.   On: 12/21/2014 09:19   Dg Chest Port 1 View  12/20/2014   CLINICAL DATA:  Status post empyema drainage on the right ; acute on chronic respiratory failure, pneumonia.  EXAM: PORTABLE CHEST - 1 VIEW  COMPARISON:  Portable chest x-ray of December 19, 2014 and CT scan of the chest dated December 17, 2014.  FINDINGS: The lungs are well-expanded. The 3 right-sided chest tubes are unchanged in positioning. There is no pneumothorax. There is a moderate amount of subcutaneous emphysema in the right axillary region and at the base of the  right neck. No significant pleural effusion is demonstrated. There are cavities in the posterior inferior aspect of the right upper lobe. The left lung is clear. The heart and pulmonary vascularity are within the limits of normal. The right internal jugular venous catheter tip projects over the midportion of the SVC.  IMPRESSION: No residual right pleural effusion and no evidence of a pneumothorax. The 3 chest tubes on the right are unchanged in appearance. Persistent cavitary lesions inferiorly in the right upper lobe are noted.   Electronically Signed   By: David  Martinique M.D.   On: 12/20/2014 07:33   Dg Chest Port 1 View  12/19/2014   CLINICAL DATA:  Status post right thoracotomy  EXAM: PORTABLE CHEST - 1 VIEW  COMPARISON:  12/18/2014  FINDINGS: Cardiac shadow is stable. A right jugular central line is seen in satisfactory position. Three thoracostomy catheters are noted. No pneumothorax is seen. No significant residual empyema is seen. The left lung remains clear.  IMPRESSION: Status post chest tube placement without pneumothorax. No significant  residual fluid collection is seen.   Electronically Signed   By: Inez Catalina M.D.   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 On the right there is persistent increased density in the perihilar and infrahilar region. No pneumothorax is visible today. The right chest tube tip projects over the lateral aspect of the second rib. Considerable subcutaneous emphysema on the right persists. There is no mediastinal shift. The cardiac silhouette and pulmonary vascularity are normal.  IMPRESSION: The small right apical pneumothorax is not visible today. There is persistent density in  the right hemi thorax consistent with known empyema.   Electronically Signed   By: David  Martinique M.D.   On: 12/18/2014 07:31   Dg Chest Port 1 View  12/16/2014   CLINICAL DATA:  Pneumothorax follow-up, acute and chronic respiratory failure, long history of tobacco use now discontinued.  EXAM: PORTABLE CHEST - 1 VIEW  COMPARISON:  Portable chest x-ray of December 15, 2014  FINDINGS: There remains a large amount of subcutaneous emphysema in the right axillary region and base of the neck. Two right-sided chest tubes are present and unchanged in position. No pneumothorax is visible. Confluent alveolar opacities persist in the right perihilar region with areas of lucency consistent with known cavitation. Increased right infrahilar alveolar opacities are noted as well. There is no significant pleural effusion. There is no mediastinal shift. The left lung is clear. The heart and pulmonary vascularity are normal.  IMPRESSION: 1. No pneumothorax is visible today. The chest tubes are unchanged in position. 2. Known cavitary pneumonia on the right, unchanged. Increasing right basilar airspace disease.   Electronically Signed   By: David  Martinique   On: 12/16/2014 07:26   Dg Chest Port 1 View  12/15/2014   CLINICAL DATA:  Pneumothorax  EXAM: PORTABLE CHEST - 1 VIEW  COMPARISON:  12/14/2014  FINDINGS: Unchanged positioning of the right-sided chest tube. A trace right apical pneumothorax is present, 5% or less. Extensive right chest wall emphysema is stable.  The cavitary pneumonia in the right upper lobe it is again noted. Clear left chest. Normal heart size.  IMPRESSION: 1. Trace right apical pneumothorax with unchanged chest tube positioning. 2. Cavitary right upper lobe pneumonia.   Electronically Signed   By: Monte Fantasia M.D.   On: 12/15/2014 06:08   Dg Chest Port 1 View  12/14/2014   CLINICAL DATA:  Status post chest tube placement  EXAM: PORTABLE CHEST - 1 VIEW  COMPARISON:  12/13/2014  FINDINGS: Cardiac shadow is  within normal limits. The left lung is clear. A right-sided chest tube is again identified. No definitive pneumothorax is seen. Considerable subcutaneous emphysema is again noted. Mild right upper lobe cavitary lesion is again identified and stable.  IMPRESSION: No right-sided pneumothorax. The overall appearance is stable from the prior exam.   Electronically Signed   By: Inez Catalina M.D.   On: 12/14/2014 07:14   Dg Chest Port 1 View  12/13/2014   CLINICAL DATA:  Right pneumothorax.  Chest tube.  EXAM: PORTABLE CHEST - 1 VIEW  COMPARISON:  12/12/2014  FINDINGS: The right pneumothorax has resolved. Decreased subcutaneous emphysema on the right. Chest tube remains in good  position. Improving cavitary infiltrate in the right upper lobe. Slight progression of new atelectasis at the left lung base laterally. Heart size and pulmonary vascularity are normal.  IMPRESSION: 1. Resolution of right pneumothorax. 2. Improving cavitary infiltrate in the right upper lobe. 3. Slightly increasing left base atelectasis laterally.   Electronically Signed   By: Lorriane Shire M.D.   On: 12/13/2014 07:52   Portable Chest 1 View  12/12/2014   CLINICAL DATA:  Followup pneumonia.  EXAM: PORTABLE CHEST - 1 VIEW  COMPARISON:  12/11/2014  FINDINGS: Since the previous day's study, a small right pneumothorax has become apparent, estimated at 20%. Right-sided chest tube is stable. Significant right-sided subcutaneous emphysema is also without substantial change.  Airspace consolidation in right upper lobe extending from the right hilum is similar to the prior exam. Reticular opacities are noted in both lower lungs, increased from the previous day's study, likely atelectasis.  No left pneumothorax.  No convincing pleural effusion.  IMPRESSION: 1. Approximately 20% right-sided pneumothorax, new from the previous day's study. 2. Stable right upper lobe consolidation. 3. Mild increase in lung base reticular opacity most likely atelectasis.  No other change.   Electronically Signed   By: Lajean Manes M.D.   On: 12/12/2014 08:02   Dg Chest Port 1 View  12/11/2014   CLINICAL DATA:  Pneumonia.  COPD.  EXAM: PORTABLE CHEST - 1 VIEW  COMPARISON:  12/11/2014.  FINDINGS: The right-sided chest tube is stable. Interval worsening of subcutaneous emphysema suggesting an air leak. No obvious right-sided pneumothorax. Persistent right mid lung infiltrate. The left lung is clear.  IMPRESSION: Stable chest tube but progressive subcutaneous emphysema on the right side. No obvious pneumothorax.   Electronically Signed   By: Marijo Sanes M.D.   On: 12/11/2014 19:30    CBC  Recent Labs Lab 12/18/14 2045 12/20/14 0415 12/21/14 0320 12/22/14 0434 12/23/14 0543  WBC 16.7* 8.3 7.3 8.9 9.4  HGB 14.0 11.4* 11.3* 11.5* 11.2*  HCT 42.7 35.3* 35.3* 37.0* 35.3*  PLT 330 316 370 393 425*  MCV 92.0 92.4 93.1 95.6 95.1  MCH 30.2 29.8 29.8 29.7 30.2  MCHC 32.8 32.3 32.0 31.1 31.7  RDW 14.0 13.9 14.2 14.3 14.0    Chemistries   Recent Labs Lab 12/18/14 2045 12/20/14 0415 12/21/14 0320 12/22/14 0434 12/23/14 0543  NA 130* 136 135 137 138  K 4.9 4.3 4.1 4.0 3.6  CL 92* 96 96 96 95*  CO2 29 35* 34* 35* 36*  GLUCOSE 104* 103* 94 89 100*  BUN 13 10 10 9  <5*  CREATININE 0.80 0.68 0.72 0.66 0.64  CALCIUM 8.0* 7.5* 7.5* 7.5* 7.9*  AST 39*  --  61*  --  56*  ALT 41  --  37  --  40  ALKPHOS 88  --  72  --  83  BILITOT 1.2  --  0.8  --  0.5   ------------------------------------------------------------------------------------------------------------------ estimated creatinine clearance is 111.7 mL/min (by C-G formula based on Cr of 0.64). ------------------------------------------------------------------------------------------------------------------ No results for input(s): HGBA1C in the last 72 hours. ------------------------------------------------------------------------------------------------------------------ No results for input(s):  CHOL, HDL, LDLCALC, TRIG, CHOLHDL, LDLDIRECT in the last 72 hours. ------------------------------------------------------------------------------------------------------------------ No results for input(s): TSH, T4TOTAL, T3FREE, THYROIDAB in the last 72 hours.  Invalid input(s): FREET3 ------------------------------------------------------------------------------------------------------------------ No results for input(s): VITAMINB12, FOLATE, FERRITIN, TIBC, IRON, RETICCTPCT in the last 72 hours.  Coagulation profile  Recent Labs Lab 12/18/14 2045  INR 1.09    No results for input(s): DDIMER in  the last 72 hours.  Cardiac Enzymes No results for input(s): CKMB, TROPONINI, MYOGLOBIN in the last 168 hours.  Invalid input(s): CK ------------------------------------------------------------------------------------------------------------------ Invalid input(s): POCBNP  No results for input(s): GLUCAP in the last 72 hours.   Shandell Giovanni M.D. [facility] 12/23/2014, 12:32 PM  Pager: [phone]   Between 7am to 7pm - call Pager - [phone]  After 7pm go to [email or web address] - password [email or web address]  Call night coverage person covering after 7pm

## 2014-12-23 NOTE — Progress Notes (Signed)
Rt chest tube removed, as ordered and per protocol dressing applied. Will monitor patient. Mckennah Kretchmer, Bettina Gavia RN

## 2014-12-23 NOTE — Progress Notes (Signed)
Entered patient room to replace telemetry box.  Found patient disoriented and walking around bed.  Telemetry box was on counter.  Patient states he did not know how that happened.  Patient also disconnected from pleurovac and wall suction.  When I asked patient to sit and examined right chest tube.  Tube was broken and open to air.  Called charge nurse. Tube was clamped.  Jadene Pierini, [place] contacted.  Orders were placed to discontinue chest tube and take bedside chest x-ray.  Additional broken tubing was found in trash can.  Patient also stated he had not idea how that happened. Chest tube removed by RN.  X-ray notified and bedside x-ray taken.

## 2014-12-23 NOTE — Progress Notes (Signed)
Patient fentanyl 75 mcg was due to be changed at 1830.  No patches available in pyxis.  Spoke with pharmacy.  We only have 25 mcg and 100 mcg patches available at this time.  Pharmacy tech will load new 75 mcg patches in pyxis. Old patch located on left arm has not yet been removed.

## 2014-12-23 NOTE — Progress Notes (Addendum)
      [address]       [place] [address]             [phone]      4 Days Post-Op Procedure(s) (LRB): VIDEO ASSISTED THORACOSCOPY/right thoracotomy (VATS)/DRAIN EMPYEMA (Right)   Subjective:  Jason Ballard is anxious to go home.  He also complains of his PCA continually beeping.  Objective: Vital signs in last 24 hours: Temp:  [98.4 F (36.9 C)-98.6 F (37 C)] 98.4 F (36.9 C) (04/25 0500) Pulse Rate:  [70-85] 70 (04/25 0500) Cardiac Rhythm:  [-] Normal sinus rhythm (04/24 1905) Resp:  [14-22] 14 (04/25 0546) BP: (110-118)/(61-67) 118/61 mmHg (04/25 0500) SpO2:  [90 %-100 %] 100 % (04/25 0546) Weight:  [163 lb 12.8 oz (74.3 kg)] 163 lb 12.8 oz (74.3 kg) (04/25 0500)  Intake/Output from previous day: 04/24 0701 - 04/25 0700 In: 3372.8 [P.O.:1182; I.V.:2190.8] Out: 2700 [Urine:2700]  General appearance: alert, cooperative and no distress Heart: regular rate and rhythm Lungs: clear to auscultation bilaterally Abdomen: soft, non-tender; bowel sounds normal; no masses,  no organomegaly Wound: clean and dyr  Lab Results:  Recent Labs  12/22/14 0434 12/23/14 0543  WBC 8.9 9.4  HGB 11.5* 11.2*  HCT 37.0* 35.3*  PLT 393 425*   BMET:  Recent Labs  12/22/14 0434 12/23/14 0543  NA 137 138  K 4.0 3.6  CL 96 95*  CO2 35* 36*  GLUCOSE 89 100*  BUN 9 <5*  CREATININE 0.66 0.64  CALCIUM 7.5* 7.9*    PT/INR: No results for input(s): LABPROT, INR in the last 72 hours. ABG    Component Value Date/Time   PHART 7.395 12/20/2014 0509   HCO3 32.8* 12/20/2014 0509   TCO2 34 12/20/2014 0509   O2SAT 89.0 12/20/2014 0509   CBG (last 3)  No results for input(s): GLUCAP in the last 72 hours.  Assessment/Plan: S/P Procedure(s) (LRB): VIDEO ASSISTED THORACOSCOPY/right thoracotomy (VATS)/DRAIN EMPYEMA (Right)  1. Chest tube- no air leak appreciated, minimal output- can likely d/c final chest tube today 2. Empyema- on ABX, OR cultures remain  negative 3. Pain control- d/c PCA, will place on home regimen of Percocet 4. Dispo- patient stable, will discuss chest tube removal with staff, care per primary   LOS: 12 days    BARRETT, ERIN 12/23/2014  Check x-ray in a.m. and then removed chest tube if no change Patient will need total 3 weeks IV antibiotics patient examined and medical record reviewed,agree with above note. Tharon Aquas Trigt III 12/23/2014

## 2014-12-24 ENCOUNTER — Inpatient Hospital Stay (HOSPITAL_COMMUNITY): Payer: Medicare Other

## 2014-12-24 LAB — CBC
HCT: 34.7 % — ABNORMAL LOW (ref 39.0–52.0)
Hemoglobin: 11.1 g/dL — ABNORMAL LOW (ref 13.0–17.0)
MCH: 29.7 pg (ref 26.0–34.0)
MCHC: 32 g/dL (ref 30.0–36.0)
MCV: 92.8 fL (ref 78.0–100.0)
Platelets: 516 10*3/uL — ABNORMAL HIGH (ref 150–400)
RBC: 3.74 MIL/uL — ABNORMAL LOW (ref 4.22–5.81)
RDW: 14 % (ref 11.5–15.5)
WBC: 9.6 10*3/uL (ref 4.0–10.5)

## 2014-12-24 LAB — ANAEROBIC CULTURE

## 2014-12-24 MED ORDER — FENTANYL 25 MCG/HR TD PT72: 25.0000 ug | MEDICATED_PATCH | TRANSDERMAL | Status: DC

## 2014-12-24 MED ORDER — OXYCODONE-ACETAMINOPHEN 5-325 MG PO TABS: 1.0000 | ORAL_TABLET | Freq: Four times a day (QID) | ORAL | Status: DC | PRN

## 2014-12-24 NOTE — Clinical Social Work Note (Signed)
CSW consulted to write letter for pt to help excuse him from [employer] [facility] on May 3rd.  CSW wrote letter explaining pt has been in hospital and gone through surgery and other medical interventions and needs time to rest.  Dr reviewed and signed the letter.  CSW faxed the letter to [place] signing off.  Domenica Reamer, [place] Social Worker [phone]

## 2014-12-24 NOTE — Progress Notes (Signed)
       [address]       [place] [address]             [phone]          5 Days Post-Op Procedure(s) (LRB): VIDEO ASSISTED THORACOSCOPY/right thoracotomy (VATS)/DRAIN EMPYEMA (Right)  Subjective: Events of yesterday noted. Pt had another episode of confusion last night per wife, but this am is alert and oriented.  Wants to go home. No complaints. Pain controlled and breathing stable.   Objective: Vital signs in last 24 hours: Patient Vitals for the past 24 hrs:  BP Temp Temp src Pulse Resp SpO2  12/24/14 0428 127/74 mmHg 98.8 F (37.1 C) Oral 78 18 93 %  12/23/14 2046 - - - - - 93 %  12/23/14 2021 (!) 105/56 mmHg 98.3 F (36.8 C) Oral 70 18 92 %  12/23/14 1422 130/69 mmHg 98.5 F (36.9 C) Oral 89 18 93 %  12/23/14 1133 - - - - 20 98 %   Current Weight  12/23/14 163 lb 12.8 oz (74.3 kg)     Intake/Output from previous day: 04/25 0701 - 04/26 0700 In: 740 [P.O.:720; I.V.:20] Out: 450 [Urine:450]    PHYSICAL EXAM:  Heart: RRR Lungs: Decreased BS in R base Wound: Clean and dry    Lab Results: CBC: Recent Labs  12/23/14 0543 12/24/14 0455  WBC 9.4 9.6  HGB 11.2* 11.1*  HCT 35.3* 34.7*  PLT 425* 516*   BMET:  Recent Labs  12/22/14 0434 12/23/14 0543  NA 137 138  K 4.0 3.6  CL 96 95*  CO2 35* 36*  GLUCOSE 89 100*  BUN 9 <5*  CREATININE 0.66 0.64  CALCIUM 7.5* 7.9*    PT/INR: No results for input(s): LABPROT, INR in the last 72 hours.    Assessment/Plan: S/P Procedure(s) (LRB): VIDEO ASSISTED THORACOSCOPY/right thoracotomy (VATS)/DRAIN EMPYEMA (Right) Pt became confused and self-d/c'ed chest tube yesterday, follow up CXR showed no pneumothorax.  AM CXR pending. Continue current care, minimize narcotics and watch mental status. He appears at baseline right now.   LOS: 13 days    COLLINS,GINA H 12/24/2014

## 2014-12-24 NOTE — Progress Notes (Signed)
Nursing note Patient CL dcd as ordered and per protocol, tip intact. Pt tolerated well reminded to stay in bed for 30 minutes. Pressure held and dressing applied. Will continue to monitor patient. Broxton Broady, Bettina Gavia RN

## 2014-12-24 NOTE — Care Management Note (Signed)
    Page 1 of 1   12/24/2014     2:46:32 PM CARE MANAGEMENT NOTE 12/24/2014  Patient:  Jason Ballard, Jason Ballard   Account Number:  [id number]  Date Initiated:  12/12/2014  Documentation initiated by:  Luz Lex  Subjective/Objective Assessment:   Tx from outside hospital - pneumothorax with CT - on bipap intitially.     Action/Plan:   Anticipated DC Date:  12/24/2014   Anticipated DC Plan:  [place]  CM consult      Choice offered to / List presented to:             Status of service:  Completed, signed off Medicare Important Message given?  NO (If response is "NO", the following Medicare IM given date fields will be blank) Date Medicare IM given:   Medicare IM given by:   Date Additional Medicare IM given:   Additional Medicare IM given by:    Discharge Disposition:  HOME/SELF CARE  Per UR Regulation:  Reviewed for med. necessity/level of care/duration of stay  If discussed at [place] of Stay Meetings, dates discussed:   12/17/2014  12/19/2014  12/24/2014    Comments:  Contact:  Lonnie, Reth Spouse [phone]  12/18/14- 64- Marvetta Gibbons RN, BSN [phone] Pt still with chest tube, [facility] has seen pt and pt refusing services at this time- PT has recommended HH- NCM to follow for [facility] needs post CT removal

## 2014-12-24 NOTE — Progress Notes (Signed)
Pt discharge education and instructions completed with pt and wife at bedside; all voices understanding and denies any questions. Pt IR removed per order; telemetry also removed per protocol. Pt sutures and steri-strips remains intact clean, dry and intact with no discharge or drainage noted; pt handed his prescriptions for Augmentin; Xanax; percocet; benzonatate; bisacodyl tablet; fentanyl and senna-docusate. Pt discharge home with wife to transport him home. Pt to be transported off unit via wheelchair with belongings and spouse at side after bedrest is over. Will continue to monitor quietly till discharge. Francis Gaines Nori Poland RN.

## 2014-12-24 NOTE — Discharge Summary (Signed)
Physician Discharge Summary   Patient ID: Lamon Rotundo MRN: [id number] DOB/AGE: 1966/06/04 49 y.o.  Admit date: 12/11/2014 Discharge date: 12/24/2014  Primary Care Physician:  Bonnita Nasuti, MD  Discharge Diagnoses:    . Acute on chronic respiratory failure . right Empyema . Chronic pain syndrome   Consults: Cardiothoracic surgery   Recommendations for Outpatient Follow-up:  Please obtain a chest x-ray at the time of completion of the antibiotics/Augmentin to assess complete resolution of cavitary lesions in the right lung.    DIET: Heart healthy diet    Allergies:  No Known Allergies   Discharge Medications:   Medication List    STOP taking these medications        HYDROmorphone 4 MG tablet  Commonly known as:  DILAUDID     oxyCODONE-acetaminophen 10-325 MG per tablet  Commonly known as:  PERCOCET  Replaced by:  oxyCODONE-acetaminophen 5-325 MG per tablet      TAKE these medications        acetaminophen 500 MG tablet  Commonly known as:  TYLENOL  Take 500 mg by mouth every 6 (six) hours as needed for fever.     albuterol 108 (90 BASE) MCG/ACT inhaler  Commonly known as:  PROVENTIL HFA;VENTOLIN HFA  Inhale 2 puffs into the lungs every 6 (six) hours as needed for wheezing.     ALPRAZolam 1 MG tablet  Commonly known as:  XANAX  Take 1 tablet (1 mg total) by mouth 2 (two) times daily as needed for anxiety.     amoxicillin-clavulanate 875-125 MG per tablet  Commonly known as:  AUGMENTIN  Take 1 tablet by mouth 2 (two) times daily. X 1 month     benzonatate 100 MG capsule  Commonly known as:  TESSALON  Take 1 capsule (100 mg total) by mouth 3 (three) times daily as needed for cough.     bisacodyl 5 MG EC tablet  Commonly known as:  DULCOLAX  Take 2 tablets (10 mg total) by mouth daily.     BREO ELLIPTA 100-25 MCG/INH Aepb  Generic drug:  Fluticasone Furoate-Vilanterol  Inhale 1 puff into the lungs daily at 12 noon.     DALIRESP 500 MCG Tabs  tablet  Generic drug:  roflumilast  Take 500 mcg by mouth daily.     fentaNYL 25 MCG/HR patch  Commonly known as:  DURAGESIC - dosed mcg/hr  Place 1 patch (25 mcg total) onto the skin every 3 (three) days. Further refills by primary MD     ibuprofen 200 MG tablet  Commonly known as:  ADVIL,MOTRIN  Take 400 mg by mouth every 6 (six) hours as needed for fever.     oxyCODONE-acetaminophen 5-325 MG per tablet  Commonly known as:  PERCOCET/ROXICET  Take 1-2 tablets by mouth every 6 (six) hours as needed for severe pain. Further refills by primary MD     senna-docusate 8.6-50 MG per tablet  Commonly known as:  Senokot-S  Take 1 tablet by mouth at bedtime as needed for mild constipation.     Umeclidinium Bromide 62.5 MCG/INH Aepb  Inhale 1 puff into the lungs daily at 12 noon.         Brief H and P: For complete details please refer to admission H and P, but in briefPatient is a 50 year old male with history of COPD, asthma, presented from [facility] with acute respiratory failure. He was admitted to [facility] on 4/10 with a right middle lobe pneumonia. Patient was treated with Zithromax  and Rocephin. CT chest revealed a cavitating pneumonia and subsequently patient developed pneumothorax. Chest tube was placed on 4/11 and patient was placed on BiPAP while at the OSH which caused a chest tube leak and subsequent subcutaneous emphysema. Sputum culture and blood cultures obtained at the OSH were all confimed negative on 4/13. Patient was transferred to [facility] for right chest tube and possible VATS.    Hospital Course:  Acute on chronic respiratory failure due to severe cavitary community-acquired pneumonia, right upper lung cavitary pneumonia status post VATS, right thoracotomy CTA chest showed large multiloculated thick-walled cavitary mass, moderate to large probably loculated pleural effusion. Chest tube was placed and was closely followed by CT surgery. Patient  underwent VATS and right thoracotomy on 4/21, postop day #5 the time of discharge. CT surgery discontinued chest tube 1 on 4/24, the second one on 4/25. Patient was on IV vancomycin and Zosyn. I discussed with Dr. Talbot Grumbling, ID, recommended oral Augmentin for one month and then repeat chest x-ray. Patient will need outpatient close follow-up with PCP or CT surgery.  Pneumothorax, right empyema - Status post VATS and right thoracotomy by CT surgery  COPD currently improving, no active wheezing  Hypertension controlled at this time   Chronic benzodiazepine use Continue low dose xanax to avoid withdrawal  Chronic pain issues- states pain is not well controlled, patient is on oral Dilaudid and oxycodone at home chronically likely causing the narcotic tolerance Patient has intermittent pain issues, he is also on chronic narcotics at home. Post chest tube placement and VATS, patient was placed on a fentanyl PCA which was discontinued on 4/25. Patient was placed on fentanyl patch, decreased down to 25 MCG at the time of discharge, 4 patches, no refills. Patient takes Percocet 5 times a day at home, recommended to take only as needed per his pain tolerance. Patient will benefit from outpatient pain clinic for closer monitoring of his narcotics. He did have confusion episode on 4/25 and broke his chest tube which was subsequently discontinued by TCTS.  Day of Discharge BP 127/74 mmHg  Pulse 78  Temp(Src) 98.8 F (37.1 C) (Oral)  Resp 18  Ht 5\' 9"  (1.753 m)  Wt 74.3 kg (163 lb 12.8 oz)  BMI 24.18 kg/m2  SpO2 93%  Physical Exam: General: Alert and awake oriented x3 not in any acute distress. HEENT: anicteric sclera, pupils reactive to light and accommodation CVS: S1-S2 clear no murmur rubs or gallops Chest: Decreased breath sounds on the right, dressing intact Abdomen: soft nontender, nondistended, normal bowel sounds Extremities: no cyanosis, clubbing or edema noted bilaterally Neuro:  Cranial nerves II-XII intact, no focal neurological deficits   The results of significant diagnostics from this hospitalization (including imaging, microbiology, ancillary and laboratory) are listed below for reference.    LAB RESULTS: Basic Metabolic Panel:  Recent Labs Lab 12/22/14 0434 12/23/14 0543  NA 137 138  K 4.0 3.6  CL 96 95*  CO2 35* 36*  GLUCOSE 89 100*  BUN 9 <5*  CREATININE 0.66 0.64  CALCIUM 7.5* 7.9*   Liver Function Tests:  Recent Labs Lab 12/21/14 0320 12/23/14 0543  AST 61* 56*  ALT 37 40  ALKPHOS 72 83  BILITOT 0.8 0.5  PROT 5.7* 5.6*  ALBUMIN 1.6* 1.7*   No results for input(s): LIPASE, AMYLASE in the last 168 hours. No results for input(s): AMMONIA in the last 168 hours. CBC:  Recent Labs Lab 12/23/14 0543 12/24/14 0455  WBC 9.4 9.6  HGB  11.2* 11.1*  HCT 35.3* 34.7*  MCV 95.1 92.8  PLT 425* 516*   Cardiac Enzymes: No results for input(s): CKTOTAL, CKMB, CKMBINDEX, TROPONINI in the last 168 hours. BNP: Invalid input(s): POCBNP CBG: No results for input(s): GLUCAP in the last 168 hours.  Significant Diagnostic Studies:  Portable Chest 1 View  12/25/14   CLINICAL DATA:  Followup pneumonia.  EXAM: PORTABLE CHEST - 1 VIEW  COMPARISON:  12/11/2014  FINDINGS: Since the previous day's study, a small right pneumothorax has become apparent, estimated at 20%. Right-sided chest tube is stable. Significant right-sided subcutaneous emphysema is also without substantial change.  Airspace consolidation in right upper lobe extending from the right hilum is similar to the prior exam. Reticular opacities are noted in both lower lungs, increased from the previous day's study, likely atelectasis.  No left pneumothorax.  No convincing pleural effusion.  IMPRESSION: 1. Approximately 20% right-sided pneumothorax, new from the previous day's study. 2. Stable right upper lobe consolidation. 3. Mild increase in lung base reticular opacity most likely atelectasis.  No other change.   Electronically Signed   By: Lajean Manes M.D.   On: 12/25/2014 08:02   Dg Chest Port 1 View  12/11/2014   CLINICAL DATA:  Pneumonia.  COPD.  EXAM: PORTABLE CHEST - 1 VIEW  COMPARISON:  12/11/2014.  FINDINGS: The right-sided chest tube is stable. Interval worsening of subcutaneous emphysema suggesting an air leak. No obvious right-sided pneumothorax. Persistent right mid lung infiltrate. The left lung is clear.  IMPRESSION: Stable chest tube but progressive subcutaneous emphysema on the right side. No obvious pneumothorax.   Electronically Signed   By: Marijo Sanes M.D.   On: 12/11/2014 19:30    Disposition and Follow-up:    DISPOSITION: home    DISCHARGE FOLLOW-UP     Follow-up Information    Follow up with HAGUE, Rosalyn Charters, MD. Schedule an appointment as soon as possible for a visit in 10 days.   Specialty:  Internal Medicine   Why:  for hospital follow-up   Contact information:   [address] [place] [address] [phone]       Follow up with Tharon Aquas Trigt III, MD. Schedule an appointment as soon as possible for a visit in 2 weeks.   Specialty:  Cardiothoracic Surgery   Why:  for hospital follow-up   Contact information:   [place] [address] [phone]        Time spent on Discharge: 35 mins   Signed:   RAI,RIPUDEEP M.D. [facility] 12/24/2014, 11:46 AM Pager: [phone]

## 2014-12-30 ENCOUNTER — Inpatient Hospital Stay (HOSPITAL_COMMUNITY)
Admission: AD | Admit: 2014-12-30 | Discharge: 2015-01-05 | DRG: 871 | Disposition: A | Discharge status: Home / Self Care | Payer: Medicare Other | Source: Other Acute Inpatient Hospital | Attending: Internal Medicine | Admitting: Internal Medicine

## 2014-12-30 ENCOUNTER — Encounter (HOSPITAL_COMMUNITY): Payer: Self-pay | Admitting: Internal Medicine

## 2014-12-30 ENCOUNTER — Inpatient Hospital Stay (HOSPITAL_COMMUNITY): Payer: Medicare Other

## 2014-12-30 DIAGNOSIS — J90 Pleural effusion, not elsewhere classified: Secondary | ICD-10-CM | POA: Diagnosis present

## 2014-12-30 DIAGNOSIS — A419 Sepsis, unspecified organism: Secondary | ICD-10-CM | POA: Diagnosis not present

## 2014-12-30 DIAGNOSIS — J852 Abscess of lung without pneumonia: Secondary | ICD-10-CM | POA: Diagnosis present

## 2014-12-30 DIAGNOSIS — I10 Essential (primary) hypertension: Secondary | ICD-10-CM | POA: Diagnosis not present

## 2014-12-30 DIAGNOSIS — J441 Chronic obstructive pulmonary disease with (acute) exacerbation: Secondary | ICD-10-CM | POA: Diagnosis not present

## 2014-12-30 DIAGNOSIS — Z79899 Other long term (current) drug therapy: Secondary | ICD-10-CM | POA: Diagnosis not present

## 2014-12-30 DIAGNOSIS — R509 Fever, unspecified: Secondary | ICD-10-CM | POA: Diagnosis present

## 2014-12-30 DIAGNOSIS — J45909 Unspecified asthma, uncomplicated: Secondary | ICD-10-CM | POA: Diagnosis not present

## 2014-12-30 DIAGNOSIS — D473 Essential (hemorrhagic) thrombocythemia: Secondary | ICD-10-CM | POA: Diagnosis not present

## 2014-12-30 DIAGNOSIS — Z9119 Patient's noncompliance with other medical treatment and regimen: Secondary | ICD-10-CM | POA: Diagnosis not present

## 2014-12-30 DIAGNOSIS — G894 Chronic pain syndrome: Secondary | ICD-10-CM | POA: Diagnosis not present

## 2014-12-30 DIAGNOSIS — J939 Pneumothorax, unspecified: Secondary | ICD-10-CM | POA: Diagnosis not present

## 2014-12-30 DIAGNOSIS — J189 Pneumonia, unspecified organism: Secondary | ICD-10-CM | POA: Diagnosis present

## 2014-12-30 DIAGNOSIS — J984 Other disorders of lung: Secondary | ICD-10-CM

## 2014-12-30 DIAGNOSIS — J9383 Other pneumothorax: Secondary | ICD-10-CM | POA: Diagnosis present

## 2014-12-30 DIAGNOSIS — Z9049 Acquired absence of other specified parts of digestive tract: Secondary | ICD-10-CM | POA: Diagnosis not present

## 2014-12-30 DIAGNOSIS — Z87891 Personal history of nicotine dependence: Secondary | ICD-10-CM

## 2014-12-30 DIAGNOSIS — D75839 Thrombocytosis, unspecified: Secondary | ICD-10-CM | POA: Diagnosis present

## 2014-12-30 DIAGNOSIS — D649 Anemia, unspecified: Secondary | ICD-10-CM | POA: Diagnosis present

## 2014-12-30 LAB — RAPID URINE DRUG SCREEN, HOSP PERFORMED
Amphetamines: NOT DETECTED
Barbiturates: NOT DETECTED
Benzodiazepines: POSITIVE — AB
Cocaine: NOT DETECTED
OPIATES: POSITIVE — AB
Tetrahydrocannabinol: NOT DETECTED

## 2014-12-30 LAB — PROTIME-INR
INR: 1.32 (ref 0.00–1.49)
PROTHROMBIN TIME: 16.5 s — AB (ref 11.6–15.2)

## 2014-12-30 LAB — CBC WITH DIFFERENTIAL/PLATELET
BASOS PCT: 0 % (ref 0–1)
Basophils Absolute: 0 10*3/uL (ref 0.0–0.1)
EOS ABS: 0.2 10*3/uL (ref 0.0–0.7)
EOS PCT: 1 % (ref 0–5)
HCT: 31.2 % — ABNORMAL LOW (ref 39.0–52.0)
Hemoglobin: 10.1 g/dL — ABNORMAL LOW (ref 13.0–17.0)
LYMPHS ABS: 3.1 10*3/uL (ref 0.7–4.0)
Lymphocytes Relative: 21 % (ref 12–46)
MCH: 29.4 pg (ref 26.0–34.0)
MCHC: 32.4 g/dL (ref 30.0–36.0)
MCV: 91 fL (ref 78.0–100.0)
Monocytes Absolute: 0.9 10*3/uL (ref 0.1–1.0)
Monocytes Relative: 6 % (ref 3–12)
NEUTROS ABS: 10.5 10*3/uL — AB (ref 1.7–7.7)
NEUTROS PCT: 72 % (ref 43–77)
Platelets: 408 10*3/uL — ABNORMAL HIGH (ref 150–400)
RBC: 3.43 MIL/uL — AB (ref 4.22–5.81)
RDW: 13.5 % (ref 11.5–15.5)
WBC: 14.7 10*3/uL — AB (ref 4.0–10.5)

## 2014-12-30 LAB — COMPREHENSIVE METABOLIC PANEL
ALBUMIN: 1.6 g/dL — AB (ref 3.5–5.0)
ALT: 17 U/L (ref 17–63)
AST: 18 U/L (ref 15–41)
Alkaline Phosphatase: 89 U/L (ref 38–126)
Anion gap: 6 (ref 5–15)
BILIRUBIN TOTAL: 0.5 mg/dL (ref 0.3–1.2)
BUN: 12 mg/dL (ref 6–20)
CO2: 25 mmol/L (ref 22–32)
CREATININE: 0.86 mg/dL (ref 0.61–1.24)
Calcium: 7.5 mg/dL — ABNORMAL LOW (ref 8.9–10.3)
Chloride: 106 mmol/L (ref 101–111)
GFR calc Af Amer: >60 mL/min (ref >60)
GFR calc non Af Amer: >60 mL/min (ref >60)
GLUCOSE: 99 mg/dL (ref 70–99)
Potassium: 4 mmol/L (ref 3.5–5.1)
SODIUM: 137 mmol/L (ref 135–145)
Total Protein: 6 g/dL — ABNORMAL LOW (ref 6.5–8.1)

## 2014-12-30 LAB — APTT: aPTT: 30 seconds (ref 24–37)

## 2014-12-30 LAB — LACTIC ACID, PLASMA: Lactic Acid, Venous: 1 mmol/L (ref 0.5–2.0)

## 2014-12-30 LAB — PROCALCITONIN: PROCALCITONIN: 0.39 ng/mL

## 2014-12-30 MED ORDER — ONDANSETRON HCL 4 MG/2ML IJ SOLN
4.0000 mg | Freq: Four times a day (QID) | INTRAMUSCULAR | Status: DC | PRN
Start: 2014-12-30 — End: 2015-01-05
  Administered 2014-12-30: 4 mg via INTRAVENOUS
  Filled 2014-12-30: qty 2

## 2014-12-30 MED ORDER — FENTANYL 25 MCG/HR TD PT72
25.0000 ug | MEDICATED_PATCH | TRANSDERMAL | Status: DC
  Administered 2014-12-30: 25 ug via TRANSDERMAL
  Filled 2014-12-30: qty 1

## 2014-12-30 MED ORDER — OXYCODONE HCL 5 MG PO TABS
5.0000 mg | ORAL_TABLET | Freq: Four times a day (QID) | ORAL | Status: DC | PRN
  Administered 2014-12-30 – 2015-01-01 (×7): 10 mg via ORAL
  Administered 2015-01-01: 5 mg via ORAL
  Administered 2015-01-01: 10 mg via ORAL
  Administered 2015-01-02: 5 mg via ORAL
  Administered 2015-01-02 – 2015-01-05 (×9): 10 mg via ORAL
  Filled 2014-12-30 (×7): qty 2
  Filled 2014-12-30: qty 1
  Filled 2014-12-30 (×2): qty 2
  Filled 2014-12-30: qty 1
  Filled 2014-12-30 (×8): qty 2

## 2014-12-30 MED ORDER — SODIUM CHLORIDE 0.9 % IV SOLN
INTRAVENOUS | Status: DC
  Administered 2014-12-30: 900 mL via INTRAVENOUS
  Administered 2014-12-31 – 2015-01-02 (×3): via INTRAVENOUS

## 2014-12-30 MED ORDER — ALPRAZOLAM 0.5 MG PO TABS
1.0000 mg | ORAL_TABLET | Freq: Two times a day (BID) | ORAL | Status: DC | PRN
  Administered 2014-12-30 – 2015-01-04 (×10): 1 mg via ORAL
  Filled 2014-12-30 (×11): qty 2

## 2014-12-30 MED ORDER — ENOXAPARIN SODIUM 40 MG/0.4ML ~~LOC~~ SOLN
40.0000 mg | Freq: Every day | SUBCUTANEOUS | Status: DC
  Administered 2014-12-30: 40 mg via SUBCUTANEOUS
  Filled 2014-12-30: qty 0.4

## 2014-12-30 MED ORDER — PIPERACILLIN-TAZOBACTAM 3.375 G IVPB
3.3750 g | Freq: Three times a day (TID) | INTRAVENOUS | Status: DC
  Administered 2014-12-30 – 2015-01-05 (×19): 3.375 g via INTRAVENOUS
  Filled 2014-12-30 (×23): qty 50

## 2014-12-30 MED ORDER — ONDANSETRON HCL 4 MG PO TABS: 4.0000 mg | ORAL_TABLET | Freq: Four times a day (QID) | ORAL | Status: DC | PRN

## 2014-12-30 MED ORDER — SENNOSIDES-DOCUSATE SODIUM 8.6-50 MG PO TABS: 1.0000 | ORAL_TABLET | Freq: Every evening | ORAL | Status: DC | PRN

## 2014-12-30 MED ORDER — SODIUM CHLORIDE 0.9 % IV SOLN
INTRAVENOUS | Status: DC
  Administered 2014-12-30: 09:00:00 via INTRAVENOUS
  Administered 2014-12-30: 1000 mL via INTRAVENOUS

## 2014-12-30 MED ORDER — BISACODYL 5 MG PO TBEC
10.0000 mg | DELAYED_RELEASE_TABLET | Freq: Every day | ORAL | Status: DC
  Administered 2014-12-31: 10 mg via ORAL
  Filled 2014-12-30 (×6): qty 2

## 2014-12-30 MED ORDER — SODIUM CHLORIDE 0.9 % IV BOLUS (SEPSIS)
500.0000 mL | Freq: Once | INTRAVENOUS | Status: AC
  Administered 2014-12-30: 500 mL via INTRAVENOUS

## 2014-12-30 MED ORDER — OXYCODONE-ACETAMINOPHEN 5-325 MG PO TABS
1.0000 | ORAL_TABLET | Freq: Four times a day (QID) | ORAL | Status: DC | PRN
  Administered 2014-12-30: 1 via ORAL
  Administered 2014-12-30: 2 via ORAL
  Administered 2014-12-30: 1 via ORAL
  Filled 2014-12-30: qty 2
  Filled 2014-12-30 (×2): qty 1

## 2014-12-30 MED ORDER — ACETAMINOPHEN 650 MG RE SUPP: 650.0000 mg | Freq: Four times a day (QID) | RECTAL | Status: DC | PRN

## 2014-12-30 MED ORDER — ACETAMINOPHEN 325 MG PO TABS
650.0000 mg | ORAL_TABLET | Freq: Four times a day (QID) | ORAL | Status: DC | PRN
  Administered 2014-12-30: 650 mg via ORAL
  Filled 2014-12-30: qty 2

## 2014-12-30 MED ORDER — VANCOMYCIN HCL IN DEXTROSE 1-5 GM/200ML-% IV SOLN
1000.0000 mg | Freq: Three times a day (TID) | INTRAVENOUS | Status: DC
  Administered 2014-12-30 – 2015-01-05 (×19): 1000 mg via INTRAVENOUS
  Filled 2014-12-30 (×21): qty 200

## 2014-12-30 MED ORDER — BENZONATATE 100 MG PO CAPS
100.0000 mg | ORAL_CAPSULE | Freq: Three times a day (TID) | ORAL | Status: DC | PRN
  Administered 2015-01-03: 100 mg via ORAL
  Filled 2014-12-30 (×2): qty 1

## 2014-12-30 NOTE — H&P (Addendum)
[facility] History and Physical  Jason Ballard JXB:[id number] DOB: 01-31-1966 DOA: 12/30/2014  Referring physician: patient was transferred from [facility]. PCP: Bonnita Nasuti, MD  Specialists: none.  Chief Complaint: fever.  HPI: Jason Ballard is a 49 y.o. male who was recently admitted for empyema and cavitary pneumonia requiring VATS presents to the ER at [facility] with complaints of fever and chills. Patient states yesterday afternoon patient woke up with fever and chills and had a fever of 102F. Patient has been having persistent cough since discharge. In the ER patient was found to be febrile and CT chest done showed persistent cavitation with fluid levels and diffuse tiny nodules in both lower lobes new from 12/17/2014 this could represent endobronchial spread of infection. Patient was given 4 L normal saline bolus and [facility]. Patient's blood work done at [facility] was showing leukocytosis and normocytic normochromic anemia. On exam patient denies any chest pain or any leak from the recent dressing on his chest on the right side. Denies nausea vomiting abdominal pain diarrhea headache visual symptoms.   Review of Systems: As presented in the history of presenting illness, rest negative.  Past Medical History  Diagnosis Date  . COPD (chronic obstructive pulmonary disease)   . Asthma   . Emphysema    Past Surgical History  Procedure Laterality Date  . Back surgery  2008    neck surgery  . Nerve replacement in right elbow, 2008    . Appendectomy    . Anterior cervical decomp/discectomy fusion N/A 05/03/2013    Procedure: ANTERIOR CERVICAL DECOMPRESSION/DISCECTOMY FUSION Cervical Three-Four;  Surgeon: Faythe Ghee, MD;  Location: [place] [facility] ORS;  Service: Neurosurgery;  Laterality: N/A;  . Video assisted thoracoscopy (vats)/empyema Right 12/19/2014    Procedure: VIDEO ASSISTED THORACOSCOPY/right thoracotomy  (VATS)/DRAIN EMPYEMA;  Surgeon: Ivin Poot, MD;  Location: [facility] OR;  Service: Thoracic;  Laterality: Right;   Social History:  reports that he quit smoking about 2 years ago. His smoking use included Cigarettes. He has a 33 pack-year smoking history. He has never used smokeless tobacco. He reports that he drinks alcohol. He reports that he does not use illicit drugs. Where does patient live home. Can patient participate in ADLs? Yes.  No Known Allergies  Family History:  Family History  Problem Relation Age of Onset  . Diabetes Mellitus II Neg Hx   . Stroke Neg Hx       Prior to Admission medications   Medication Sig Start Date End Date Taking? Authorizing Provider  acetaminophen (TYLENOL) 500 MG tablet Take 500 mg by mouth every 6 (six) hours as needed for fever.    Historical Provider, MD  albuterol (PROVENTIL HFA;VENTOLIN HFA) 108 (90 BASE) MCG/ACT inhaler Inhale 2 puffs into the lungs every 6 (six) hours as needed for wheezing.    Historical Provider, MD  ALPRAZolam Duanne Moron) 1 MG tablet Take 1 tablet (1 mg total) by mouth 2 (two) times daily as needed for anxiety. 12/23/14   Ripudeep Krystal Eaton, MD  amoxicillin-clavulanate (AUGMENTIN) 875-125 MG per tablet Take 1 tablet by mouth 2 (two) times daily. X 1 month 12/23/14   Ripudeep Krystal Eaton, MD  benzonatate (TESSALON) 100 MG capsule Take 1 capsule (100 mg total) by mouth 3 (three) times daily as needed for cough. 12/23/14   Ripudeep Krystal Eaton, MD  bisacodyl (DULCOLAX) 5 MG EC tablet Take 2 tablets (10 mg total) by mouth daily. 12/23/14   Ripudeep K  Rai, MD  BREO ELLIPTA 100-25 MCG/INH AEPB Inhale 1 puff into the lungs daily at 12 noon. 11/13/14   Historical Provider, MD  fentaNYL (DURAGESIC - DOSED MCG/HR) 25 MCG/HR patch Place 1 patch (25 mcg total) onto the skin every 3 (three) days. Further refills by primary MD 12/24/14   Ripudeep Krystal Eaton, MD  ibuprofen (ADVIL,MOTRIN) 200 MG tablet Take 400 mg by mouth every 6 (six) hours as needed for fever.     Historical Provider, MD  oxyCODONE-acetaminophen (PERCOCET/ROXICET) 5-325 MG per tablet Take 1-2 tablets by mouth every 6 (six) hours as needed for severe pain. Further refills by primary MD 12/24/14   Ripudeep Krystal Eaton, MD  roflumilast (DALIRESP) 500 MCG TABS tablet Take 500 mcg by mouth daily.    Historical Provider, MD  senna-docusate (SENOKOT-S) 8.6-50 MG per tablet Take 1 tablet by mouth at bedtime as needed for mild constipation. 12/23/14   Ripudeep Krystal Eaton, MD  Umeclidinium Bromide 62.5 MCG/INH AEPB Inhale 1 puff into the lungs daily at 12 noon.    Historical Provider, MD    Physical Exam: There were no vitals filed for this visit.   General:  Moderately built and nourished.  Eyes: anicteric no pallor.  ENT: no discharge from the ears eyes nose and mouth.  Neck: no mass felt.  Cardiovascular: S1-S2 heard.  Respiratory: no rhonchi or crepitations.  Abdomen: soft nontender bowel sounds present.  Skin: no discharge from the dressing on the right side of the chest.  Musculoskeletal: no edema.  Psychiatric: is normal.  Neurologic:alert awake oriented to time place and person. Moves all extremities.  Labs on Admission:  Basic Metabolic Panel:  Recent Labs Lab 12/23/14 0543  NA 138  K 3.6  CL 95*  CO2 36*  GLUCOSE 100*  BUN <5*  CREATININE 0.64  CALCIUM 7.9*   Liver Function Tests:  Recent Labs Lab 12/23/14 0543  AST 56*  ALT 40  ALKPHOS 83  BILITOT 0.5  PROT 5.6*  ALBUMIN 1.7*   No results for input(s): LIPASE, AMYLASE in the last 168 hours. No results for input(s): AMMONIA in the last 168 hours. CBC:  Recent Labs Lab 12/23/14 0543 12/24/14 0455  WBC 9.4 9.6  HGB 11.2* 11.1*  HCT 35.3* 34.7*  MCV 95.1 92.8  PLT 425* 516*   Cardiac Enzymes: No results for input(s): CKTOTAL, CKMB, CKMBINDEX, TROPONINI in the last 168 hours.  BNP (last 3 results) No results for input(s): BNP in the last 8760 hours.  ProBNP (last 3 results) No results for  input(s): PROBNP in the last 8760 hours.  CBG: No results for input(s): GLUCAP in the last 168 hours.  Radiological Exams on Admission: No results found.  EKG: Independently reviewed. Sinus tachycardia. EKG done in the ER at [facility].  Assessment/Plan Principal Problem:   Sepsis Active Problems:   Chronic pain syndrome   HCAP (healthcare-associated pneumonia)   Anemia, normocytic normochromic   1. Sepsis from pneumonia - I have discussed with the on-call pulmonary critical care Dr. Leonidas Romberg. Pulmonary critical care will be seeing patient in consult. At this time they have requested to get 2-D echo. Patient had been placed on vancomycin and Zosyn. Blood cultures were obtained at [facility] and I have also ordered another set over here. Check pro-calcitonin levels lactic acid levels and continue hydration. 2. Normocytic normochromic anemia - follow CBC. If there is no significant fall in hemoglobin and further workup as outpatient. 3. Chronic pain syndrome - continue present medications.  I have reviewed charts at [facility]. I have also reviewed patient's recent chart from recent stay. I have reviewed patient's chest x-rays and CT scan and discuss with pulmonary critical care Dr. Leonidas Romberg.   DVT ProphylaxisLovenox.  Code Status: full code.  Family Communication: discussed with patient.  Disposition Plan: admitto inpatient.   Zinnia Tindall N. [facility] Pager [phone].  If 7PM-7AM, please contact night-coverage [email or web address] Password [email or web address] 12/30/2014, 2:23 AM

## 2014-12-30 NOTE — Progress Notes (Signed)
  [facility] TEAM 1 - Stepdown/ICU TEAM Progress Note  Jason Ballard BOF:[id number] DOB: 11/09/1965 DOA: 12/30/2014 PCP: Bonnita Nasuti, MD  Admit HPI / Brief Narrative: 49 y.o. male who was recently admitted for empyema and cavitary pneumonia requiring VATS who presented to the ER at [facility] with complaints of fever and chills. Patient stated he awake the day prior  with chills and a fever to 102F.   In the ER patient was found to be febrile and CT showed persistent cavitation with fluid levels and diffuse tiny nodules in both lower lobes new from 12/17/2014.   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 rate 16, height 5\' 9"  (1.753 m), weight 70.6 kg (155 lb 10.3 oz), SpO2 96 %.  Intake/Output Summary (Last 24 hours) at 12/30/14 1605 Last data filed at 12/30/14 1300  Gross per 24 hour  Intake    360 ml  Output   1850 ml  Net  -1490 ml   Exam: Patient seen for follow-up visit  Data Reviewed: Basic Metabolic Panel:  Recent Labs Lab 12/30/14 0320  NA 137  K 4.0  CL  106  CO2 25  GLUCOSE 99  BUN 12  CREATININE 0.86  CALCIUM 7.5*    CBC:  Recent Labs Lab 12/24/14 0455 12/30/14 0320  WBC 9.6 14.7*  NEUTROABS  --  10.5*  HGB 11.1* 10.1*  HCT 34.7* 31.2*  MCV 92.8 91.0  PLT 516* 408*    Liver Function Tests:  Recent Labs Lab 12/30/14 0320  AST 18  ALT 17  ALKPHOS 89  BILITOT 0.5  PROT 6.0*  ALBUMIN 1.6*   Coags:  Recent Labs Lab 12/30/14 0320  INR 1.32    Recent Labs Lab 12/30/14 0320  APTT 30    Studies:   Recent x-ray studies have been reviewed in detail by the Attending Physician  Scheduled Meds:  Scheduled Meds: . bisacodyl  10 mg Oral Daily  . enoxaparin (LOVENOX) injection  40 mg Subcutaneous Daily  . fentaNYL  25 mcg Transdermal Q72H  . piperacillin-tazobactam (ZOSYN)  IV  3.375 g Intravenous 3 times per day  . vancomycin  1,000 mg Intravenous Q8H    Time spent on care of this patient: No charge   Cherene Altes , MD   [facility] Office  [phone] Pager - Text Page per Amion as per below:  On-Call/Text Page:      [email or web address]      password [email or web address]  If 7PM-7AM, please contact night-coverage [email or web address] Password [email or web address] 12/30/2014, 4:05 PM   LOS: 0 days

## 2014-12-30 NOTE — Progress Notes (Signed)
Arrived to insert PIV however pt stated,  "They already got it here"   Showing me his right AC.  Site WNL.

## 2014-12-30 NOTE — Progress Notes (Signed)
UDS collected and sent to the lab.

## 2014-12-30 NOTE — Progress Notes (Signed)
ANTIBIOTIC CONSULT NOTE - INITIAL  Pharmacy Consult for Vancomycin and Zosyn  Indication: pneumonia  No Known Allergies  Patient Measurements:    Vital Signs:   Intake/Output from previous day:   Intake/Output from this shift:    Labs (at [facility]): WBC 20.2 Hgb 12.6 Hct 38.6 Plt 642  SCr 1.00  No results for input(s): WBC, HGB, PLT, LABCREA, CREATININE in the last 72 hours. Estimated Creatinine Clearance: 111.7 mL/min (by C-G formula based on Cr of 0.64). No results for input(s): VANCOTROUGH, VANCOPEAK, VANCORANDOM, GENTTROUGH, GENTPEAK, GENTRANDOM, TOBRATROUGH, TOBRAPEAK, TOBRARND, AMIKACINPEAK, AMIKACINTROU, AMIKACIN in the last 72 hours.   Microbiology: Recent Results (from the past 720 hour(s))  MRSA PCR Screening     Status: None   Collection Time: 12/11/14  9:49 PM  Result Value Ref Range Status   MRSA by PCR NEGATIVE NEGATIVE Final    Comment:        The GeneXpert MRSA Assay (FDA approved for NASAL specimens only), is one component of a comprehensive MRSA colonization surveillance program. It is not intended to diagnose MRSA infection nor to guide or monitor treatment for MRSA infections.   Culture, blood (routine x 2)     Status: None   Collection Time: 12/17/14  1:35 PM  Result Value Ref Range Status   Specimen Description BLOOD LEFT ARM  Final   Special Requests BOTTLES DRAWN AEROBIC ONLY 2CC  Final   Culture   Final    NO GROWTH 5 DAYS Note: Culture results may be compromised due to an inadequate volume of blood received in culture bottles. Performed at [facility]    Report Status 12/23/2014 FINAL  Final  Culture, blood (routine x 2)     Status: None   Collection Time: 12/17/14  1:45 PM  Result Value Ref Range Status   Specimen Description BLOOD BLOOD LEFT WRIST  Final   Special Requests BOTTLES DRAWN AEROBIC ONLY 3CC  Final   Culture   Final    NO GROWTH 5 DAYS Note: Culture results may be compromised due to an inadequate  volume of blood received in culture bottles. Performed at [facility]    Report Status 12/23/2014 FINAL  Final  Anaerobic culture     Status: None   Collection Time: 12/19/14 11:02 AM  Result Value Ref Range Status   Specimen Description FLUID RIGHT PLEURAL  Final   Special Requests PATIENT ON FOLLOWING ZINACEF VANCOMYCIN ZOSYN  Final   Gram Stain   Final    RARE WBC PRESENT, PREDOMINANTLY PMN NO ORGANISMS SEEN Performed at [facility]    Culture   Final    NO ANAEROBES ISOLATED Performed at [facility]    Report Status 12/24/2014 FINAL  Final  Body fluid culture     Status: None   Collection Time: 12/19/14 11:02 AM  Result Value Ref Range Status   Specimen Description FLUID RIGHT PLEURAL  Final   Special Requests PATIENT ON FOLLOWING ZINACEF VANCOMYCIN ZOSYN  Final   Gram Stain   Final    RARE WBC PRESENT, PREDOMINANTLY PMN NO ORGANISMS SEEN Performed at [facility]    Culture   Final    NO GROWTH 3 DAYS Performed at [facility]    Report Status 12/22/2014 FINAL  Final    Medical History: Past Medical History  Diagnosis Date  . COPD (chronic obstructive pulmonary disease)   . Asthma   . Emphysema     Medications:  Xanax  Breo Ellipta  Incruse Ellipta  Daliresp  Albuterol  Augmentin    Assessment: 49 y.o. male s/p recent VATS, now with fever and leukocytosis, for empiric antibiotics.  Vancomycin 1500 mg IV currently infusing   Goal of Therapy:  Vancomycin trough level 15-20 mcg/ml  Plan:  Vancomycin 1 g IV q8h Zosyn 3.375 g IV q8h   Caryl Pina 12/30/2014,2:01 AM

## 2014-12-30 NOTE — Progress Notes (Signed)
Utilization Review Completed.  

## 2014-12-30 NOTE — Progress Notes (Signed)
  Subjective: Patient examined and CT scan of the chest performed yesterday at [facility] reviewed personally. The patient had right VATS with drainage of large empyema and decortication of right lower lobe on April 21 at [place]. His operative material demonstrated gram negative rods, gram-positive rods but final cultures were negative. He was treated with IV antibiotics while in the hospital and discharged to home on oral Augmentin. He return to [facility] over the weekend complaining of recurrent fever malaise pain and was transferred to this hospital. Chest CT scan demonstrates complete drainage of empyema with residual pneumonia-infected bleb disease in the posterior segment of the right upper lobe. The surgical VATS incision is healing well. He was started on IV antibiotics-vancomycin and Zosyn and he has been afebrile with a mildly elevated WBC 14.7.  Objective: Vital signs in last 24 hours: Temp:  [97.9 F (36.6 C)-98.5 F (36.9 C)] 98.5 F (36.9 C) (05/02 0800) Pulse Rate:  [62-91] 91 (05/02 0800) Cardiac Rhythm:  [-]  Resp:  [16-22] 16 (05/02 0800) BP: (81-102)/(46-55) 89/46 mmHg (05/02 0800) SpO2:  [93 %-98 %] 95 % (05/02 0800) Weight:  [155 lb 10.3 oz (70.6 kg)] 155 lb 10.3 oz (70.6 kg) (05/02 0100)  Hemodynamic parameters for last 24 hours:   stable Intake/Output from previous day: 05/01 0701 - 05/02 0700 In: -  Out: 1200 [Urine:1200] Intake/Output this shift: Total I/O In: -  Out: 250 [Urine:250]  Right VATS incision healing well. Chest tube sites healing well.  Lab Results:  Recent Labs  12/30/14 0320  WBC 14.7*  HGB 10.1*  HCT 31.2*  PLT 408*   BMET:  Recent Labs  12/30/14 0320  NA 137  K 4.0  CL 106  CO2 25  GLUCOSE 99  BUN 12  CREATININE 0.86  CALCIUM 7.5*    PT/INR:  Recent Labs  12/30/14 0320  LABPROT 16.5*  INR 1.32   ABG    Component Value Date/Time   PHART 7.395 12/20/2014 0509   HCO3 32.8* 12/20/2014 0509    TCO2 34 12/20/2014 0509   O2SAT 89.0 12/20/2014 0509   CBG (last 3)  No results for input(s): GLUCAP in the last 72 hours.  Assessment/Plan: S/P   No evidence recurrent empyema. Continue IV antibiotics, recommend vancomycin and Zosyn, for duration of current hospitalization. Persistent disease in the posterior segment right upper lobe is pneumonia-chronic bleb disease. This is not an indication for resection. Since the patient is a IV drug abuser he should not be discharged home on IV antibiotics with a PICC line. Recommend IV antibiotics in the hospital for least one week. Will follow.   LOS: 0 days    Tharon Aquas Trigt III 12/30/2014

## 2014-12-30 NOTE — Progress Notes (Signed)
This patient had an echo on 12/29/2014 ordered by his primary care physician. It was performed at [facility]. Per the patient and his wife, it was normal. They were given the results via cell phone today.

## 2014-12-30 NOTE — Consult Note (Signed)
Name: Jason Ballard MRN: [id number] DOB: 03-Jan-1966    ADMISSION DATE:  12/30/2014 CONSULTATION DATE:  12/30/2014  REFERRING MD :  Thereasa Solo  CHIEF COMPLAINT:  Fevers  BRIEF PATIENT DESCRIPTION: 49 year old male with recent admit for cavitary PNA/empyema requiring VATs. He was admitted 5/2 for sepsis 2nd to HCAP. PCCM consulted.   SIGNIFICANT EVENTS  4/11-4/26 admission to [facility] for empyema, cavitary PNA, PTX  STUDIES:  Chest CT 5/1 > Diffuse tiny nodules in both lower lobes new from prior study. Persisting cavitary lesion in the posterior segmunt of RUL now with fluid level. Near resolution of R effusion/empyema.    HISTORY OF PRESENT ILLNESS:  49 year old male with PMH as below, which includes COPD and asthma. He was recently admitted to [facility] with cavitating PNA, empyema, and eventually he developed pneumothorax. He underwent right VATS with drainage of large empyema and decortication of right lower lobe 4/21. Was discharged on oral augmentin for one month 4/26. 5/1 he presented again to [facility] with complaints of cough, fever/chills. In ED he was found to be febrile and chest CT demonstrates complete drainage of empyema with residual pneumonia-infected bleb disease in the posterior segment of the right upper lobe, several nodules in bilateral lower lobes. He was transferred to [facility] hospital for admission under the hospitalist team and treated from sepsis 2nd to PNA and started on broad spectrum antibiotics. PCCM consulted.  PAST MEDICAL HISTORY :   has a past medical history of COPD (chronic obstructive pulmonary disease); Asthma; and Emphysema.  has past surgical history that includes Back surgery (2008); nerve replacement in right elbow, 2008; Appendectomy; Anterior cervical decomp/discectomy fusion (N/A, 05/03/2013); and Video assisted thoracoscopy (vats)/empyema (Right, 12/19/2014). Prior to Admission medications   Medication Sig Start Date End Date Taking? Authorizing Provider    acetaminophen (TYLENOL) 500 MG tablet Take 500 mg by mouth every 6 (six) hours as needed for fever.    Historical Provider, MD  albuterol (PROVENTIL HFA;VENTOLIN HFA) 108 (90 BASE) MCG/ACT inhaler Inhale 2 puffs into the lungs every 6 (six) hours as needed for wheezing.    Historical Provider, MD  ALPRAZolam Duanne Moron) 1 MG tablet Take 1 tablet (1 mg total) by mouth 2 (two) times daily as needed for anxiety. 12/23/14   Ripudeep Krystal Eaton, MD  amoxicillin-clavulanate (AUGMENTIN) 875-125 MG per tablet Take 1 tablet by mouth 2 (two) times daily. X 1 month 12/23/14   Ripudeep Krystal Eaton, MD  benzonatate (TESSALON) 100 MG capsule Take 1 capsule (100 mg total) by mouth 3 (three) times daily as needed for cough. 12/23/14   Ripudeep Krystal Eaton, MD  bisacodyl (DULCOLAX) 5 MG EC tablet Take 2 tablets (10 mg total) by mouth daily. 12/23/14   Ripudeep K Rai, MD  BREO ELLIPTA 100-25 MCG/INH AEPB Inhale 1 puff into the lungs daily at 12 noon. 11/13/14   Historical Provider, MD  fentaNYL (DURAGESIC - DOSED MCG/HR) 25 MCG/HR patch Place 1 patch (25 mcg total) onto the skin every 3 (three) days. Further refills by primary MD 12/24/14   Ripudeep Krystal Eaton, MD  ibuprofen (ADVIL,MOTRIN) 200 MG tablet Take 400 mg by mouth every 6 (six) hours as needed for fever.    Historical Provider, MD  oxyCODONE-acetaminophen (PERCOCET/ROXICET) 5-325 MG per tablet Take 1-2 tablets by mouth every 6 (six) hours as needed for severe pain. Further refills by primary MD 12/24/14   Ripudeep Krystal Eaton, MD  roflumilast (DALIRESP) 500 MCG TABS tablet Take 500 mcg by mouth daily.  Historical Provider, MD  senna-docusate (SENOKOT-S) 8.6-50 MG per tablet Take 1 tablet by mouth at bedtime as needed for mild constipation. 12/23/14   Ripudeep Krystal Eaton, MD  Umeclidinium Bromide 62.5 MCG/INH AEPB Inhale 1 puff into the lungs daily at 12 noon.    Historical Provider, MD   No Known Allergies  FAMILY HISTORY:  family history is negative for Diabetes Mellitus II and Stroke. SOCIAL  HISTORY:  reports that he quit smoking about 2 years ago. His smoking use included Cigarettes. He has a 33 pack-year smoking history. He has never used smokeless tobacco. He reports that he drinks alcohol. He reports that he does not use illicit drugs.  REVIEW OF SYSTEMS:  Bolds are positive  Constitutional: weight loss, gain, night sweats, Fevers, chills, fatigue .  HEENT: headaches, Sore throat, sneezing, nasal congestion, post nasal drip, Difficulty swallowing, Tooth/dental problems, visual complaints visual changes, ear ache CV:  chest pain, radiates: ,Orthopnea, PND, swelling in lower extremities, dizziness, palpitations, syncope.  GI  heartburn, indigestion, abdominal pain with cough, nausea, vomiting, diarrhea, change in bowel habits, loss of appetite, bloody stools.  Resp: cough, non-productive: , hemoptysis, dyspnea, chest pain, pleuritic.  Skin: rash or itching or icterus GU: dysuria, change in color of urine, urgency or frequency. flank pain, hematuria  MS: joint pain or swelling. decreased range of motion  Psych: change in mood or affect. depression or anxiety.  Neuro: difficulty with speech, weakness, numbness, ataxia    SUBJECTIVE:   VITAL SIGNS: Temp:  [97.9 F (36.6 C)-98.6 F (37 C)] 98.6 F (37 C) (05/02 1200) Pulse Rate:  [62-108] 108 (05/02 1200) Resp:  [16-22] 16 (05/02 1200) BP: (81-124)/(46-78) 124/78 mmHg (05/02 1200) SpO2:  [93 %-98 %] 96 % (05/02 1200) Weight:  [70.6 kg (155 lb 10.3 oz)] 70.6 kg (155 lb 10.3 oz) (05/02 0100)  PHYSICAL EXAMINATION: General:  Male of normal body habitus in NAD Neuro:  Alert, oriented, non-verbal HEENT:  [place]/AT, no JVD noted. PERRL Cardiovascular:  Tachy (108), regular Lungs:  Coarse bilateral breath sounds.  Abdomen:  Soft, non-tender, non-distended Musculoskeletal:  No acute deformity or ROM limitations Skin:  Grossly intact, several tattoos.    Recent Labs Lab 12/30/14 0320  NA 137  K 4.0  CL 106  CO2 25  BUN  12  CREATININE 0.86  GLUCOSE 99    Recent Labs Lab 12/24/14 0455 12/30/14 0320  HGB 11.1* 10.1*  HCT 34.7* 31.2*  WBC 9.6 14.7*  PLT 516* 408*   No results found.  ASSESSMENT / PLAN:  Sepsis 2nd to HCAP, failed outpatient augmentin (was on vanc zosyn as inpatient) R lung persistent cavitating lesion Small residual R pleural effusion s/p VATs Reported h/o IV drug abuse - Supplemental O2 as needed to keep SpO2 > 92% - Continue broad spectrum antibiotics Vanc/Zosyn 5/1 >>> - Cultures from last admission negative - No indication for resection at this time per TCTS eval - Trend PCT  - Follow CXR - Will likely be slow to progress  PCCM will sign off.  If no improvement with IV antibiotics please re-consult for potential BAL, cultures.    Georgann Housekeeper, AGACNP-BC [facility] Pager [phone] or [phone]  12/30/2014 2:22 PM   Attending Note:  I have examined patient, reviewed labs, studies and notes. I have discussed the case with Jaclynn Guarneri, and I agree with the data and plans as amended above. He declined on Augmentin for his RLL lung abscess. Agree with restarting IV abx. If  he does not clinically respond then would perform FOB to allow cx's. Ultimately he may need surgical resection if conservative management won't resolve this. Hopefully this won't be necessary. If he does not improve, please call so we can arrange for BAL.   Baltazar Apo, MD, PhD 12/30/2014, 2:59 PM  Pulmonary and Critical Care [phone] or if no answer [phone]

## 2014-12-31 ENCOUNTER — Inpatient Hospital Stay (HOSPITAL_COMMUNITY): Payer: Medicare Other

## 2014-12-31 DIAGNOSIS — A419 Sepsis, unspecified organism: Secondary | ICD-10-CM | POA: Diagnosis present

## 2014-12-31 DIAGNOSIS — J939 Pneumothorax, unspecified: Secondary | ICD-10-CM

## 2014-12-31 DIAGNOSIS — Z79899 Other long term (current) drug therapy: Secondary | ICD-10-CM

## 2014-12-31 DIAGNOSIS — I10 Essential (primary) hypertension: Secondary | ICD-10-CM

## 2014-12-31 DIAGNOSIS — J984 Other disorders of lung: Secondary | ICD-10-CM

## 2014-12-31 DIAGNOSIS — J441 Chronic obstructive pulmonary disease with (acute) exacerbation: Secondary | ICD-10-CM | POA: Diagnosis present

## 2014-12-31 DIAGNOSIS — J189 Pneumonia, unspecified organism: Secondary | ICD-10-CM

## 2014-12-31 LAB — COMPREHENSIVE METABOLIC PANEL
ALBUMIN: 1.7 g/dL — AB (ref 3.5–5.0)
ALT: 31 U/L (ref 17–63)
AST: 49 U/L — ABNORMAL HIGH (ref 15–41)
Alkaline Phosphatase: 140 U/L — ABNORMAL HIGH (ref 38–126)
Anion gap: 6 (ref 5–15)
BUN: 5 mg/dL — ABNORMAL LOW (ref 6–20)
CALCIUM: 8.1 mg/dL — AB (ref 8.9–10.3)
CO2: 27 mmol/L (ref 22–32)
CREATININE: 0.71 mg/dL (ref 0.61–1.24)
Chloride: 102 mmol/L (ref 101–111)
GFR calc Af Amer: >60 mL/min (ref >60)
GFR calc non Af Amer: >60 mL/min (ref >60)
Glucose, Bld: 107 mg/dL — ABNORMAL HIGH (ref 70–99)
Potassium: 3.5 mmol/L (ref 3.5–5.1)
SODIUM: 135 mmol/L (ref 135–145)
Total Bilirubin: 0.7 mg/dL (ref 0.3–1.2)
Total Protein: 6.3 g/dL — ABNORMAL LOW (ref 6.5–8.1)

## 2014-12-31 LAB — CBC
HCT: 32.1 % — ABNORMAL LOW (ref 39.0–52.0)
HEMOGLOBIN: 10.4 g/dL — AB (ref 13.0–17.0)
MCH: 29.5 pg (ref 26.0–34.0)
MCHC: 32.4 g/dL (ref 30.0–36.0)
MCV: 90.9 fL (ref 78.0–100.0)
PLATELETS: 498 10*3/uL — AB (ref 150–400)
RBC: 3.53 MIL/uL — AB (ref 4.22–5.81)
RDW: 13.2 % (ref 11.5–15.5)
WBC: 14.7 10*3/uL — ABNORMAL HIGH (ref 4.0–10.5)

## 2014-12-31 MED ORDER — ROFLUMILAST 500 MCG PO TABS
500.0000 ug | ORAL_TABLET | Freq: Every day | ORAL | Status: DC
  Administered 2014-12-31 – 2015-01-05 (×6): 500 ug via ORAL
  Filled 2014-12-31 (×6): qty 1

## 2014-12-31 MED ORDER — IPRATROPIUM-ALBUTEROL 0.5-2.5 (3) MG/3ML IN SOLN: 3.0000 mL | RESPIRATORY_TRACT | Status: DC | PRN

## 2014-12-31 MED ORDER — IPRATROPIUM-ALBUTEROL 0.5-2.5 (3) MG/3ML IN SOLN
3.0000 mL | Freq: Four times a day (QID) | RESPIRATORY_TRACT | Status: DC
  Administered 2014-12-31: 3 mL via RESPIRATORY_TRACT
  Filled 2014-12-31: qty 3

## 2014-12-31 MED ORDER — FENTANYL 50 MCG/HR TD PT72
50.0000 ug | MEDICATED_PATCH | TRANSDERMAL | Status: DC
  Administered 2015-01-02 – 2015-01-05 (×2): 50 ug via TRANSDERMAL
  Filled 2014-12-31 (×2): qty 1

## 2014-12-31 MED ORDER — MOMETASONE FURO-FORMOTEROL FUM 200-5 MCG/ACT IN AERO
2.0000 | INHALATION_SPRAY | Freq: Two times a day (BID) | RESPIRATORY_TRACT | Status: DC
  Administered 2014-12-31 – 2015-01-05 (×10): 2 via RESPIRATORY_TRACT
  Filled 2014-12-31: qty 8.8

## 2014-12-31 MED ORDER — METHYLPREDNISOLONE SODIUM SUCC 125 MG IJ SOLR
60.0000 mg | INTRAMUSCULAR | Status: DC
  Administered 2014-12-31 – 2015-01-01 (×2): 60 mg via INTRAVENOUS
  Filled 2014-12-31: qty 0.96
  Filled 2014-12-31: qty 2

## 2014-12-31 MED ORDER — DM-GUAIFENESIN ER 30-600 MG PO TB12
1.0000 | ORAL_TABLET | Freq: Two times a day (BID) | ORAL | Status: DC
  Administered 2014-12-31 – 2015-01-05 (×11): 1 via ORAL
  Filled 2014-12-31 (×12): qty 1
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
## 2015-01-01 ENCOUNTER — Inpatient Hospital Stay (HOSPITAL_COMMUNITY): Payer: Medicare Other

## 2015-01-01 LAB — INFLUENZA PANEL BY PCR (TYPE A & B)
H1N1FLUPCR: NOT DETECTED
Influenza A By PCR: NEGATIVE
Influenza B By PCR: NEGATIVE

## 2015-01-01 LAB — PROCALCITONIN: PROCALCITONIN: 0.22 ng/mL

## 2015-01-01 MED ORDER — ENOXAPARIN SODIUM 40 MG/0.4ML ~~LOC~~ SOLN
40.0000 mg | SUBCUTANEOUS | Status: DC
  Administered 2015-01-01 – 2015-01-04 (×4): 40 mg via SUBCUTANEOUS
  Filled 2015-01-01 (×4): qty 0.4

## 2015-01-01 NOTE — Progress Notes (Signed)
[place] TEAM 1 - Stepdown/ICU TEAM Progress Note  Jason Ballard GHW:[id number] DOB: [birth year] DOA: 12/30/2014 PCP: Bonnita Nasuti, MD  Admit HPI / Brief Narrative: 49 y.o. male who was recently admitted for empyema and cavitary pneumonia requiring VATS who presented to the ER at [facility] with complaints of fever and chills. Patient stated he awake the day prior  with chills and a fever to 102F.   In the ER patient was found to be febrile and CT showed persistent cavitation with fluid levels and diffuse tiny nodules in both lower lobes new from 12/17/2014.   HPI/Subjective: The patient is resting comfortably in bed.  He reports that he feels much better.  He states he is getting quite antsy and would like to get up and ambulate.  He denies current chest pain fevers chills nausea vomiting or abdominal pain.  Assessment/Plan:  Sepsis due to HCAP in RUL bleb Failed outpt augmentin - plan is to continue IV antibiotics for a minimum of 7 days (not an appropriate candidate for outpatient IV antibiotics via PICC due to history of substance abuse) - afebrile since 12/30/2014  Right upper lobe cavitary pneumonia originally diagnosed 12/08/2014 Patient underwent VATS 4/21 - was discharged on Augmentin for planned one-month course after discussion with ID - TCTS has seen this admission and sees no evidence of recurrent empyema  Pneumothorax related to cavitary pneumonia Resolved with chest tube during prior hospital stay  COPD Well compensated at present time - Dr. Sherral Hammers resumed patient's home medications yesterday  Hypertension Blood pressures currently well-controlled  History of chronic benzodiazepine use Primary team should be the only team changing narcotics and benzos - no change in Xanax dose today  Normocytic anemia Hemoglobin is stable  Chronic pain syndrome - reported hx of IV drug abuse  Primary team should be the only team changing narcotics and benzos - continue  fentanyl patch plus oxycodone IR  Code Status: FULL Family Communication: No family present at time of exam today Disposition Plan: Transfer to medical bed - complete 7 day course of antibiotics with plan for discharge home thereafter  Consultants: TCTS PCCM  Procedures: none  Antibiotics: Zosyn 5/1 > Vancomycin 5/2 >  DVT prophylaxis: Lovenox  Objective: Blood pressure 110/61, pulse 62, temperature 98.1 F (36.7 C), temperature source Oral, resp. rate 19, height 5\' 9"  (1.753 m), weight 69.3 kg (152 lb 12.5 oz), SpO2 93 %.  Intake/Output Summary (Last 24 hours) at 01/01/15 1601 Last data filed at 01/01/15 1431  Gross per 24 hour  Intake   3085 ml  Output   1852 ml  Net   1233 ml   Exam: General: No acute respiratory distress at rest Lungs: Coarse crackles scattered throughout right lung fields with no active wheeze Cardiovascular: Regular rate and rhythm without murmur gallop or rub normal S1 and S2 Abdomen: Nontender, nondistended, soft, bowel sounds positive, no rebound, no ascites, no appreciable mass Extremities: No significant cyanosis, clubbing, or edema bilateral lower extremities   Data Reviewed: Basic Metabolic Panel:  Recent Labs Lab 12/30/14 0320 12/31/14 0300  NA 137 135  K 4.0 3.5  CL 106 102  CO2 25 27  GLUCOSE 99 107*  BUN 12 5*  CREATININE 0.86 0.71  CALCIUM 7.5* 8.1*    CBC:  Recent Labs Lab 12/30/14 0320 12/31/14 0300  WBC 14.7* 14.7*  NEUTROABS 10.5*  --   HGB 10.1* 10.4*  HCT 31.2* 32.1*  MCV 91.0 90.9  PLT 408* 498*  Liver Function Tests:  Recent Labs Lab 12/30/14 0320 12/31/14 0300  AST 18 49*  ALT 17 31  ALKPHOS 89 140*  BILITOT 0.5 0.7  PROT 6.0* 6.3*  ALBUMIN 1.6* 1.7*   Coags:  Recent Labs Lab 12/30/14 0320  INR 1.32    Recent Labs Lab 12/30/14 0320  APTT 30    Studies:   Recent x-ray studies have been reviewed in detail by the Attending Physician  Scheduled Meds:  Scheduled Meds: .  bisacodyl  10 mg Oral Daily  . dextromethorphan-guaiFENesin  1 tablet Oral BID  . [START ON 01/02/2015] fentaNYL  50 mcg Transdermal Q72H  . methylPREDNISolone (SOLU-MEDROL) injection  60 mg Intravenous Q24H  . mometasone-formoterol  2 puff Inhalation BID  . piperacillin-tazobactam (ZOSYN)  IV  3.375 g Intravenous 3 times per day  . roflumilast  500 mcg Oral Daily  . vancomycin  1,000 mg Intravenous Q8H    Time spent on care of this patient: 35 minutes   Aamya Orellana T , MD   [facility] Office  [phone] Pager - Text Page per Shea Evans as per below:  On-Call/Text Page:      [email or web address]      password [email or web address]  If 7PM-7AM, please contact night-coverage [email or web address] Password [email or web address] 01/01/2015, 4:01 PM   LOS: 2 days

## 2015-01-02 DIAGNOSIS — D473 Essential (hemorrhagic) thrombocythemia: Secondary | ICD-10-CM

## 2015-01-02 DIAGNOSIS — D75839 Thrombocytosis, unspecified: Secondary | ICD-10-CM | POA: Diagnosis present

## 2015-01-02 DIAGNOSIS — J9383 Other pneumothorax: Secondary | ICD-10-CM

## 2015-01-02 LAB — COMPREHENSIVE METABOLIC PANEL
ALT: 36 U/L (ref 17–63)
AST: 29 U/L (ref 15–41)
Albumin: 2 g/dL — ABNORMAL LOW (ref 3.5–5.0)
Alkaline Phosphatase: 141 U/L — ABNORMAL HIGH (ref 38–126)
Anion gap: 9 (ref 5–15)
BUN: 13 mg/dL (ref 6–20)
CALCIUM: 8.7 mg/dL — AB (ref 8.9–10.3)
CO2: 29 mmol/L (ref 22–32)
Chloride: 103 mmol/L (ref 101–111)
Creatinine, Ser: 0.78 mg/dL (ref 0.61–1.24)
GFR calc non Af Amer: >60 mL/min (ref >60)
GLUCOSE: 103 mg/dL — AB (ref 70–99)
Potassium: 4 mmol/L (ref 3.5–5.1)
SODIUM: 141 mmol/L (ref 135–145)
Total Bilirubin: 0.5 mg/dL (ref 0.3–1.2)
Total Protein: 6.6 g/dL (ref 6.5–8.1)

## 2015-01-02 LAB — CBC WITH DIFFERENTIAL/PLATELET
BASOS ABS: 0 10*3/uL (ref 0.0–0.1)
BASOS PCT: 0 % (ref 0–1)
Eosinophils Absolute: 0 10*3/uL (ref 0.0–0.7)
Eosinophils Relative: 0 % (ref 0–5)
HCT: 32.8 % — ABNORMAL LOW (ref 39.0–52.0)
HEMOGLOBIN: 10.5 g/dL — AB (ref 13.0–17.0)
Lymphocytes Relative: 25 % (ref 12–46)
Lymphs Abs: 2.8 10*3/uL (ref 0.7–4.0)
MCH: 29.2 pg (ref 26.0–34.0)
MCHC: 32 g/dL (ref 30.0–36.0)
MCV: 91.4 fL (ref 78.0–100.0)
MONOS PCT: 7 % (ref 3–12)
Monocytes Absolute: 0.8 10*3/uL (ref 0.1–1.0)
NEUTROS ABS: 7.5 10*3/uL (ref 1.7–7.7)
NEUTROS PCT: 68 % (ref 43–77)
Platelets: 653 10*3/uL — ABNORMAL HIGH (ref 150–400)
RBC: 3.59 MIL/uL — AB (ref 4.22–5.81)
RDW: 13.2 % (ref 11.5–15.5)
WBC: 11.1 10*3/uL — AB (ref 4.0–10.5)

## 2015-01-02 LAB — CLOSTRIDIUM DIFFICILE BY PCR: Toxigenic C. Difficile by PCR: NEGATIVE

## 2015-01-02 MED ORDER — IPRATROPIUM-ALBUTEROL 0.5-2.5 (3) MG/3ML IN SOLN
3.0000 mL | Freq: Four times a day (QID) | RESPIRATORY_TRACT | Status: DC
  Administered 2015-01-02 – 2015-01-03 (×3): 3 mL via RESPIRATORY_TRACT
  Filled 2015-01-02 (×3): qty 3
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
## 2015-01-02 NOTE — Progress Notes (Signed)
No bed was available for patient to go on [facility] floor. Patient also noted to request more frequency for pain medications and xanax. Patient's wife called to inquired about patient's meds and agreed to speak with MD in the morning. Patient also noted non-complaint with safety and calling for assistance with getting in and out of the bed. Patient was redirected and bed alarm placed on.

## 2015-01-03 LAB — PROCALCITONIN: Procalcitonin: 0.15 ng/mL

## 2015-01-03 MED ORDER — IPRATROPIUM-ALBUTEROL 0.5-2.5 (3) MG/3ML IN SOLN
3.0000 mL | Freq: Two times a day (BID) | RESPIRATORY_TRACT | Status: DC
  Administered 2015-01-03 – 2015-01-05 (×4): 3 mL via RESPIRATORY_TRACT
  Filled 2015-01-03 (×4): qty 3

## 2015-01-03 NOTE — Care Management Note (Signed)
Case Management Note  Patient Details  Name: Eligah Anello MRN: [id number] Date of Birth: 1966-06-15  Subjective/Objective:                    Action/Plan: UR updated. Magdalen Spatz RN BSN   Expected Discharge Date:  01/08/15               Expected Discharge Plan:  Home/Self Care  In-House Referral:     Discharge planning Services  CM Consult  Post Acute Care Choice:    Choice offered to:     DME Arranged:    DME Agency:     HH Arranged:    HH Agency:     Status of Service:  In process, will continue to follow  Medicare Important Message Given:  Yes Date Medicare IM Given:  01/02/15 Medicare IM give by:  Babette Relic RN  Date Additional Medicare IM Given:   01-03-15  Additional Medicare Important Message give SH:[id number] Avielle Imbert RN BSN      If discussed at [place] of Stay Meetings, dates discussed:    Additional Comments:  Marilu Favre, RN 01/03/2015, 8:45 AM
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
## 2015-01-04 LAB — BASIC METABOLIC PANEL
Anion gap: 8 (ref 5–15)
BUN: 5 mg/dL — ABNORMAL LOW (ref 6–20)
CO2: 27 mmol/L (ref 22–32)
CREATININE: 0.7 mg/dL (ref 0.61–1.24)
Calcium: 8.1 mg/dL — ABNORMAL LOW (ref 8.9–10.3)
Chloride: 102 mmol/L (ref 101–111)
GFR calc Af Amer: >60 mL/min (ref >60)
GFR calc non Af Amer: >60 mL/min (ref >60)
GLUCOSE: 86 mg/dL (ref 70–99)
Potassium: 3 mmol/L — ABNORMAL LOW (ref 3.5–5.1)
Sodium: 137 mmol/L (ref 135–145)

## 2015-01-04 LAB — CBC
HEMATOCRIT: 32.5 % — AB (ref 39.0–52.0)
Hemoglobin: 10.7 g/dL — ABNORMAL LOW (ref 13.0–17.0)
MCH: 29.3 pg (ref 26.0–34.0)
MCHC: 32.9 g/dL (ref 30.0–36.0)
MCV: 89 fL (ref 78.0–100.0)
Platelets: 586 10*3/uL — ABNORMAL HIGH (ref 150–400)
RBC: 3.65 MIL/uL — ABNORMAL LOW (ref 4.22–5.81)
RDW: 13.1 % (ref 11.5–15.5)
WBC: 11 10*3/uL — ABNORMAL HIGH (ref 4.0–10.5)

## 2015-01-04 MED ORDER — POTASSIUM CHLORIDE CRYS ER 20 MEQ PO TBCR
40.0000 meq | EXTENDED_RELEASE_TABLET | Freq: Once | ORAL | Status: AC
  Administered 2015-01-04: 40 meq via ORAL
  Filled 2015-01-04: qty 2
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
## 2015-01-05 ENCOUNTER — Inpatient Hospital Stay (HOSPITAL_COMMUNITY): Payer: Medicare Other

## 2015-01-05 LAB — CULTURE, BLOOD (ROUTINE X 2)
Culture: NO GROWTH
Culture: NO GROWTH

## 2015-01-05 LAB — BASIC METABOLIC PANEL
Anion gap: 8 (ref 5–15)
BUN: 6 mg/dL (ref 6–20)
CHLORIDE: 100 mmol/L — AB (ref 101–111)
CO2: 30 mmol/L (ref 22–32)
Calcium: 8.2 mg/dL — ABNORMAL LOW (ref 8.9–10.3)
Creatinine, Ser: 0.97 mg/dL (ref 0.61–1.24)
GLUCOSE: 98 mg/dL (ref 70–99)
Potassium: 3 mmol/L — ABNORMAL LOW (ref 3.5–5.1)
Sodium: 138 mmol/L (ref 135–145)

## 2015-01-05 LAB — CBC
HEMATOCRIT: 34.4 % — AB (ref 39.0–52.0)
HEMOGLOBIN: 11.3 g/dL — AB (ref 13.0–17.0)
MCH: 29.6 pg (ref 26.0–34.0)
MCHC: 32.8 g/dL (ref 30.0–36.0)
MCV: 90.1 fL (ref 78.0–100.0)
Platelets: 632 10*3/uL — ABNORMAL HIGH (ref 150–400)
RBC: 3.82 MIL/uL — AB (ref 4.22–5.81)
RDW: 13.1 % (ref 11.5–15.5)
WBC: 14.1 10*3/uL — AB (ref 4.0–10.5)

## 2015-01-05 MED ORDER — BISACODYL 5 MG PO TBEC: 5.0000 mg | DELAYED_RELEASE_TABLET | Freq: Every day | ORAL | Status: DC

## 2015-01-05 MED ORDER — METRONIDAZOLE 500 MG PO TABS: 500.0000 mg | ORAL_TABLET | Freq: Three times a day (TID) | ORAL | Status: DC

## 2015-01-05 MED ORDER — LEVOFLOXACIN 500 MG PO TABS: 500.0000 mg | ORAL_TABLET | Freq: Every day | ORAL | Status: DC

## 2015-01-07 ENCOUNTER — Other Ambulatory Visit: Payer: Self-pay | Admitting: Cardiothoracic Surgery

## 2015-01-07 DIAGNOSIS — J869 Pyothorax without fistula: Secondary | ICD-10-CM
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
## 2015-01-08 ENCOUNTER — Ambulatory Visit
Admission: RE | Admit: 2015-01-08 | Discharge: 2015-01-08 | Disposition: A | Discharge status: Home / Self Care | Payer: Medicare Other | Source: Ambulatory Visit | Attending: Cardiothoracic Surgery | Admitting: Cardiothoracic Surgery

## 2015-01-08 ENCOUNTER — Encounter: Payer: Self-pay | Admitting: Cardiothoracic Surgery

## 2015-01-08 ENCOUNTER — Ambulatory Visit (INDEPENDENT_AMBULATORY_CARE_PROVIDER_SITE_OTHER): Payer: Self-pay | Admitting: Cardiothoracic Surgery

## 2015-01-08 VITALS — BP 130/86 | HR 88 | Resp 20 | Ht 69.0 in | Wt 152.0 lb

## 2015-01-08 DIAGNOSIS — J869 Pyothorax without fistula: Secondary | ICD-10-CM

## 2015-01-08 DIAGNOSIS — J189 Pneumonia, unspecified organism: Secondary | ICD-10-CM

## 2015-01-08 NOTE — Progress Notes (Signed)
PCP is HAGUE, Rosalyn Charters, MD Referring Provider is Alphonzo Lemmings, MD  Chief Complaint  Patient presents with  . Routine Post Op    f/u from surgery with CXR, s/p RT VATS for drainage of empyema and decortication of right lower lobe on 12/19/14    [id number] followup after right VATS and drainage of empyema and decortication of right lower lobe last month. The patient also has a probable lung abscess in the right upper lobe which is being treated with prolonged antibiotics and followed by infectious disease. The patient has stop smoking. His VATS incision is well-healed. He has no shortness of breath or productive cough or fever. Overall strength and appetite and well being are improved. He did have some nausea he relates to the Flagyl. He is also taking Levaquin.  Chest x-ray personally reviewed today shows resolution of empyema and the right lung abscess has decreased in size   Past Medical History  Diagnosis Date  . COPD (chronic obstructive pulmonary disease)   . Asthma   . Emphysema     Past Surgical History  Procedure Laterality Date  . Back surgery  2008    neck surgery  . Nerve replacement in right elbow, 2008    . Appendectomy    . Anterior cervical decomp/discectomy fusion N/A 05/03/2013    Procedure: ANTERIOR CERVICAL DECOMPRESSION/DISCECTOMY FUSION Cervical Three-Four;  Surgeon: Faythe Ghee, MD;  Location: [facility];  Service: Neurosurgery;  Laterality: N/A;  . Video assisted thoracoscopy (vats)/empyema Right 12/19/2014    Procedure: VIDEO ASSISTED THORACOSCOPY/right thoracotomy (VATS)/DRAIN EMPYEMA;  Surgeon: Ivin Poot, MD;  Location: [facility];  Service: Thoracic;  Laterality: Right;    Family History  Problem Relation Age of Onset  . Diabetes Mellitus II Neg Hx   . Stroke Neg Hx     Social History History  Substance Use Topics  . Smoking status: Former Smoker -- 1.00 packs/day for 33 years    Types: Cigarettes    Quit date: 08/07/2012  . Smokeless tobacco:  Never Used  . Alcohol Use: Yes     Comment: occasional    Current Outpatient Prescriptions  Medication Sig Dispense Refill  . Aclidinium Bromide 400 MCG/ACT AEPB Inhale 2 puffs into the lungs 2 (two) times daily. [place]    . albuterol (PROVENTIL HFA;VENTOLIN HFA) 108 (90 BASE) MCG/ACT inhaler Inhale 2 puffs into the lungs every 6 (six) hours as needed for wheezing.    Marland Kitchen ALPRAZolam (XANAX) 1 MG tablet Take 1 tablet (1 mg total) by mouth 2 (two) times daily as needed for anxiety. (Patient taking differently: Take 1 mg by mouth 3 (three) times daily. ) 20 tablet 0  . bisacodyl (DULCOLAX) 5 MG EC tablet Take 1 tablet (5 mg total) by mouth daily. 10 tablet 0  . fluconazole (DIFLUCAN) 200 MG tablet Take 200 mg by mouth daily. 10 day course started 12/27/14    . ibuprofen (ADVIL,MOTRIN) 200 MG tablet Take 400 mg by mouth every 6 (six) hours as needed for fever.    Marland Kitchen levofloxacin (LEVAQUIN) 500 MG tablet Take 1 tablet (500 mg total) by mouth daily. For 2 weeks 14 tablet 0  . metroNIDAZOLE (FLAGYL) 500 MG tablet Take 1 tablet (500 mg total) by mouth 3 (three) times daily. For 2 weeks 42 tablet 0  . mometasone-formoterol (DULERA) 200-5 MCG/ACT AERO Inhale 2 puffs into the lungs 2 (two) times daily.    Marland Kitchen nystatin (MYCOSTATIN/NYSTOP) 100000 UNIT/GM POWD Apply topically 4 (four) times daily.  For rash on groin    . oxyCODONE-acetaminophen (PERCOCET) 10-325 MG per tablet Take 1 tablet by mouth See admin instructions. Take 1 tablet by mouth every 5 hours, scheduled    . roflumilast (DALIRESP) 500 MCG TABS tablet Take 500 mcg by mouth daily.     No current facility-administered medications for this visit.    No Known Allergies  Review of Systems   No fever improved appetite and strength  BP 130/86 mmHg  Pulse 88  Resp 20  Ht 5\' 9"  (1.753 m)  Wt 152 lb (68.947 kg)  BMI 22.44 kg/m2  SpO2 96% Physical Exam Lungs clear Heart rate regular VATS incision well-healed  Diagnostic Tests: Chest x-ray  personally reviewed showing significant clearing of abscess and resolution of empyema  Impression: Patient responding to therapy. Patient congratulated on smoking cessation Activity limits  Have been increased and discussed with patient  Plan:return in 6 weeks with chest x-ray for review progress   Len Childs, MD [facility] [phone]

## 2015-01-24 ENCOUNTER — Encounter: Payer: Self-pay | Admitting: Adult Health

## 2015-01-24 ENCOUNTER — Ambulatory Visit (INDEPENDENT_AMBULATORY_CARE_PROVIDER_SITE_OTHER)
Admission: RE | Admit: 2015-01-24 | Discharge: 2015-01-24 | Disposition: A | Discharge status: Home / Self Care | Payer: Medicare Other | Source: Ambulatory Visit | Attending: Adult Health | Admitting: Adult Health

## 2015-01-24 ENCOUNTER — Inpatient Hospital Stay: Payer: Self-pay | Admitting: Emergency Medicine

## 2015-01-24 ENCOUNTER — Ambulatory Visit (INDEPENDENT_AMBULATORY_CARE_PROVIDER_SITE_OTHER): Payer: Medicare Other | Admitting: Adult Health

## 2015-01-24 ENCOUNTER — Telehealth: Payer: Self-pay | Admitting: Adult Health

## 2015-01-24 VITALS — BP 114/80 | HR 76 | Temp 98.0°F | Ht 68.0 in | Wt 145.0 lb

## 2015-01-24 DIAGNOSIS — J189 Pneumonia, unspecified organism: Secondary | ICD-10-CM

## 2015-01-24 DIAGNOSIS — J441 Chronic obstructive pulmonary disease with (acute) exacerbation: Secondary | ICD-10-CM | POA: Diagnosis not present

## 2015-01-24 DIAGNOSIS — J852 Abscess of lung without pneumonia: Secondary | ICD-10-CM | POA: Diagnosis not present

## 2015-01-24 NOTE — Assessment & Plan Note (Signed)
Right-sided lung abscess, empyema, status post VATS complicated by sepsis Patient is slowly improving with prolonged antibiotics Patient has very poor dentition and is on chronic narcotics ? Questionable contributing factors for infection/abscess  Plan Repeat CT chest in 4 weeks Follow-up with Dr. Lamonte Sakai in 4 weeks and as needed Please contact office for sooner follow up if symptoms do not improve or worsen or seek emergency care

## 2015-01-24 NOTE — Assessment & Plan Note (Signed)
Recent exacerbation with underlying lung abscess , now resolved Congratulated on smoking cessation We'll continue on current regimen Check PFTs on return

## 2015-01-24 NOTE — Progress Notes (Signed)
Subjective:    Patient ID: Jason Ballard, male    DOB: Apr 22, 1966, 49 y.o.   MRN: [id number]  HPI 49 year old male former heavy  smoker, quit April 2016, seen for pulmonary consult for cavitary pneumonia/empyema requiring VATS Initially admitted April 11 through 12/24/2014 for empyema with cavitary pneumonia and pneumothorax. Readmitted May 2 for sepsis secondary to pneumonia.  01/24/2015 post hospital follow-up Patient returns for a post hospital follow-up Patient recently had 2 hospitalizations. Initially admitted April 11 through April 26 4 empyema with right-sided cavitary pneumonia and pneumothorax. He underwent a RIGHT VATS with drainage of large empyema and decortication of the right lower lobe on April 22. He was discharged on Augmentin for 1 month Patient return back to the emergency room with fever, shortness of breath and suspected sepsis secondary to right-sided pneumonia. He was treated with aggressive IV anabiotic's and discharged on Levaquin and Flagyl for 2 additional weeks. Since discharge. Patient says he is slowly improving. Cough and congestion are decreasing. Patient has previously been followed by pulmonary and [place], [place] . However, says that he does not want to go back there. He is on [place], Daliresp and nocturnal oxygen. Patient has not smoked since discharge He has finished his anabiotic's. He denies any fever, discolored mucus, chest pain, orthopnea, PND, leg swelling, nausea, vomiting, diarrhea. Appetite is starting to return. He has had significant weight loss. Chest x-ray today shows improvement in right sided consolidation.  Patient has a former history of heavy smoking. History of IV drug use. Chronic pain syndrome with previous neck surgery on chronic narcotics.     Review of Systems Constitutional:   No  weight loss, night sweats,  Fevers, chills, fatigue, or  lassitude.  HEENT:   No headaches,  Difficulty swallowing,   Tooth/dental problems, or  Sore throat,                No sneezing, itching, ear ache, nasal congestion, post nasal drip,   CV:  No chest pain,  Orthopnea, PND, swelling in lower extremities, anasarca, dizziness, palpitations, syncope.   GI  No heartburn, indigestion, abdominal pain, nausea, vomiting, diarrhea, change in bowel habits, loss of appetite, bloody stools.   Resp:  No excess mucus, no productive cough,  No non-productive cough,  No coughing up of blood.  No change in color of mucus.  No wheezing.  No chest wall deformity  Skin: no rash or lesions.  GU: no dysuria, change in color of urine, no urgency or frequency.  No flank pain, no hematuria   MS:  No joint pain or swelling.  No decreased range of motion.   Psych:  No change in mood or affect. No depression or anxiety.  No memory loss.         Objective:   Physical Exam GEN: A/Ox3; pleasant , NAD, thin male multiple tattoos  HEENT:  [place]/AT,  EACs-clear, TMs-wnl, NOSE-clear, THROAT-clear, no lesions, no postnasal drip or exudate noted.  Very poor dentition  NECK:  Supple w/ fair ROM; no JVD; normal carotid impulses w/o bruits; no thyromegaly or nodules palpated; no lymphadenopathy.  RESP  decreased breath sounds in the bases no accessory muscle use, no dullness to percussion  CARD:  RRR, no m/r/g  , no peripheral edema, pulses intact, no cyanosis or clubbing.  GI:   Soft & nt; nml bowel sounds; no organomegaly or masses detected.  Musco: Warm bil, no deformities or joint swelling noted.   Neuro: alert, no focal deficits  noted.    Skin: Warm, no lesions or rashes, scattered tattoos    Chest x-ray 01/24/2015  Reviewed independently The lungs are mildly hyperinflated with hemidiaphragm flattening. There has been considerable improvement in the appearance of the posterior inferior aspect of the right upper lobe. There remains mildly increased interstitial density here. The left lung is clear. The heart and  mediastinal structures are normal. There is a trace of pleural fluid on the right.    Assessment & Plan:

## 2015-01-24 NOTE — Telephone Encounter (Signed)
Received STAT call report from [facility] with [facility] on cxr done today.  Results are in epic.  Tammy, please advise. Thank you.

## 2015-01-24 NOTE — Patient Instructions (Signed)
Continue on current regimen  Great job on not smoking  Follow up with Dr. Lamonte Sakai  In 4-6 weeks  CT chest in 4-6 weeks before office visit.  Please contact office for sooner follow up if symptoms do not improve or worsen or seek emergency care

## 2015-01-24 NOTE — Telephone Encounter (Signed)
cxr was ordered STAT prior to pt being seen cxr was reviewed with patient  Per today's AVS from TP:  Patient Instructions       Continue on current regimen   Great job on not smoking   Follow up with Dr. Lamonte Sakai  In 4-6 weeks   CT chest in 4-6 weeks before office visit.   Please contact office for sooner follow up if symptoms do not improve or worsen or seek emergency care    Will sign off

## 2015-02-18 ENCOUNTER — Ambulatory Visit (INDEPENDENT_AMBULATORY_CARE_PROVIDER_SITE_OTHER)
Admission: RE | Admit: 2015-02-18 | Discharge: 2015-02-18 | Disposition: A | Discharge status: Home / Self Care | Payer: Medicare Other | Source: Ambulatory Visit | Attending: Adult Health | Admitting: Adult Health

## 2015-02-18 DIAGNOSIS — J852 Abscess of lung without pneumonia: Secondary | ICD-10-CM | POA: Diagnosis not present

## 2015-02-21 ENCOUNTER — Other Ambulatory Visit: Payer: Medicare Other

## 2015-02-21 ENCOUNTER — Ambulatory Visit (INDEPENDENT_AMBULATORY_CARE_PROVIDER_SITE_OTHER): Payer: Medicare Other | Admitting: Emergency Medicine

## 2015-02-21 ENCOUNTER — Encounter: Payer: Self-pay | Admitting: Emergency Medicine

## 2015-02-21 VITALS — BP 116/88 | HR 100 | Ht 68.0 in | Wt 152.0 lb

## 2015-02-21 DIAGNOSIS — J449 Chronic obstructive pulmonary disease, unspecified: Secondary | ICD-10-CM | POA: Diagnosis not present

## 2015-02-21 DIAGNOSIS — J851 Abscess of lung with pneumonia: Secondary | ICD-10-CM | POA: Diagnosis not present

## 2015-02-21 NOTE — Patient Instructions (Signed)
Your CT scan of the chest shows significant improvement compared with 12/29/2014. Do not see any evidence of residual pneumonia or abscess. There is no residual infection surrounding the lung.  We will plan to repeat chest x-rays one to 2 times a year to follow for any changes We will perform blood work today Please continue Cre[place] Corn, daliresp as you are taking them  Follow with Dr Lamonte Sakai in 6 months or sooner if you have any problems

## 2015-02-21 NOTE — Progress Notes (Signed)
Subjective:    Patient ID: Jason Ballard, male    DOB: [birth year], 49 y.o.   MRN: [id number]  HPI 49 year old male former heavy  smoker, quit April 2016, seen for pulmonary consult for cavitary pneumonia/empyema requiring VATS Initially admitted April 11 through 12/24/2014 for empyema with cavitary pneumonia and pneumothorax. Readmitted May 2 for sepsis secondary to pneumonia.  post hospital follow-up Patient returns for a post hospital follow-up Patient recently had 2 hospitalizations. Initially admitted April 11 through April 26 4 empyema with right-sided cavitary pneumonia and pneumothorax. He underwent a RIGHT VATS with drainage of large empyema and decortication of the right lower lobe on April 22. He was discharged on Augmentin for 1 month Patient return back to the emergency room with fever, shortness of breath and suspected sepsis secondary to right-sided pneumonia. He was treated with aggressive IV anabiotic's and discharged on Levaquin and Flagyl for 2 additional weeks. Since discharge. Patient says he is slowly improving. Cough and congestion are decreasing. Patient has previously been followed by pulmonary and [place], [place] . However, says that he does not want to go back there. He is on [place], Daliresp and nocturnal oxygen. Patient has not smoked since discharge He has finished his anabiotic's. He denies any fever, discolored mucus, chest pain, orthopnea, PND, leg swelling, nausea, vomiting, diarrhea. Appetite is starting to return. He has had significant weight loss. Chest x-ray today shows improvement in right sided consolidation.  Patient has a former history of heavy smoking. History of IV drug use. Chronic pain syndrome with previous neck surgery on chronic narcotics.  ROV 02/21/15 -- follow-up visit for history of tobacco use,necrotic right lower lobe pneumonia requiring VATS decortication on 12/20/2014. His last CT scan of the chest was 02/18/15  compared with 12/29/14. I reviewed the new scan which shows that his right upper lobe cavitary lesion is improved. The lesion now measures 5.3 x 4.3 cm, is thin-walled and does not appear to have any air-fluid level. No obvious pleural disease.  He is on [place], daliresp, albuterol.    Review of Systems As per HPI.      Objective:   Physical Exam  Filed Vitals:   02/21/15 1510  BP: 116/88  Pulse: 100  Height: 5\' 8"  (1.727 m)  Weight: 152 lb (68.947 kg)  SpO2: 96%   Gen: Pleasant, well-nourished, in no distress,  normal affect  ENT: No lesions,  mouth clear,  oropharynx clear, no postnasal drip  Neck: No JVD, no TMG, no carotid bruits  Lungs: No use of accessory muscles, no dullness to percussion, clear without rales or rhonchi  Cardiovascular: RRR, heart sounds normal, no murmur or gallops, no peripheral edema  Abdomen: soft and NT, no HSM,  BS normal  Musculoskeletal: No deformities, no cyanosis or clubbing  Neuro: alert, non focal  Skin: Warm, no lesions or rashes   02/18/15 --  COMPARISON: CT 12/29/2014  FINDINGS: Mediastinum/Nodes: No axillary supraclavicular lymphadenopathy. Small mediastinal lymph nodes are not pathologic by size criteria decreased in volume compared to prior. No pericardial fluid. Esophagus normal.  Lungs/Pleura: There is improvement in the right upper lobe cavitary lesion seen on comparison exam. Now lesion is a thin walled multilobulated cystic lesion with thin internal septations measuring 5.3 x 4.3 cm. Previously the lesion was consolidative with air-fluid levels measuring 6.2 x 4.8 cm. There is resolution of the fluid component and parenchymal inflammation.  There scattered branching nodules in the lef tlower lobe for less than 5 mm (image  39 through 44 series 3) are also improved.  Upper abdomen: Limited view of the liver, kidneys, pancreas are unremarkable. Normal adrenal glands.  Musculoskeletal: No aggressive  osseous lesion.  IMPRESSION: 1. Marked improvement in previously seen cavitary lesion with consolidation air-fluid levels in the right upper lobe. Lesion is now thin walled with out significant inflammation. 2. Improvement in bibasilar airspace nodularity also consists with improved pulmonary infectious pattern. Mild nodularity remains in the left lung base. 3. Small mediastinal lymph nodes are not pathologic by size criteria improved from prior.     Assessment & Plan:  No problem-specific assessment & plan notes found for this encounter.

## 2015-02-24 LAB — ALPHA-1 ANTITRYPSIN PHENOTYPE: A-1 Antitrypsin: 112 mg/dL (ref 90–200)

## 2015-02-26 ENCOUNTER — Other Ambulatory Visit: Payer: Self-pay

## 2015-03-11 ENCOUNTER — Other Ambulatory Visit: Payer: Self-pay | Admitting: Cardiothoracic Surgery

## 2015-03-11 DIAGNOSIS — J9383 Other pneumothorax: Secondary | ICD-10-CM

## 2015-03-12 ENCOUNTER — Ambulatory Visit (INDEPENDENT_AMBULATORY_CARE_PROVIDER_SITE_OTHER): Payer: Self-pay | Admitting: Cardiothoracic Surgery

## 2015-03-12 ENCOUNTER — Encounter: Payer: Self-pay | Admitting: Cardiothoracic Surgery

## 2015-03-12 VITALS — BP 114/78 | HR 64 | Resp 16 | Ht 68.0 in | Wt 152.0 lb

## 2015-03-12 DIAGNOSIS — J869 Pyothorax without fistula: Secondary | ICD-10-CM

## 2015-03-12 DIAGNOSIS — J189 Pneumonia, unspecified organism: Secondary | ICD-10-CM

## 2015-03-12 NOTE — Progress Notes (Signed)
PCP is HAGUE, Rosalyn Charters, MD Referring Provider is Alphonzo Lemmings, MD  Chief Complaint  Patient presents with  . Routine Post Op    2 month f/u...had CT CHEST on 02/18/15 per Dr. Malvin Johns    HPI:the patient returns for final surgical followup almost 3 months after right VATS for drainage of empyema and decortication right lower lobe. Feels well. 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 The patient is followed by Dr. Lamonte Sakai for a right lung inflammatory cavitation which is progressively getting smaller on serial scans. He denies productive cough. He is not using tobacco. He denies shortness of breath.   Past Medical History  Diagnosis Date  . COPD (chronic obstructive pulmonary disease)   . Asthma   . Emphysema     Past Surgical History  Procedure Laterality Date  . Back surgery  2008    neck surgery  . Nerve replacement in right elbow, 2008    . Appendectomy    . Anterior cervical decomp/discectomy fusion N/A 05/03/2013    Procedure: ANTERIOR CERVICAL DECOMPRESSION/DISCECTOMY FUSION Cervical Three-Four;  Surgeon: Faythe Ghee, MD;  Location: [facility];  Service: Neurosurgery;  Laterality: N/A;  . Video assisted thoracoscopy (vats)/empyema Right 12/19/2014    Procedure: VIDEO ASSISTED THORACOSCOPY/right thoracotomy (VATS)/DRAIN EMPYEMA;  Surgeon: Ivin Poot, MD;  Location: [facility];  Service: Thoracic;  Laterality: Right;    Family History  Problem Relation Age of Onset  . Diabetes Mellitus II Neg Hx   . Stroke Neg Hx     Social History History  Substance Use Topics  . Smoking status: Former Smoker -- 1.00 packs/day for 33 years    Types: Cigarettes    Quit date: 08/07/2012  . Smokeless tobacco: Never Used  . Alcohol Use: Yes     Comment: occasional    Current Outpatient Prescriptions  Medication Sig Dispense Refill  . Aclidinium Bromide 400 MCG/ACT AEPB Inhale 2 puffs into the lungs 2 (two) times daily. [place]    . albuterol (PROVENTIL HFA;VENTOLIN HFA) 108 (90  BASE) MCG/ACT inhaler Inhale 2 puffs into the lungs every 6 (six) hours as needed for wheezing.    Marland Kitchen ALPRAZolam (XANAX) 1 MG tablet Take 1 tablet (1 mg total) by mouth 2 (two) times daily as needed for anxiety. (Patient taking differently: Take 1 mg by mouth 3 (three) times daily. ) 20 tablet 0  . mometasone-formoterol (DULERA) 200-5 MCG/ACT AERO Inhale 2 puffs into the lungs 2 (two) times daily.    Marland Kitchen oxyCODONE-acetaminophen (PERCOCET) 10-325 MG per tablet Take 1 tablet by mouth See admin instructions. Take 1 tablet by mouth every 5 hours, scheduled    . roflumilast (DALIRESP) 500 MCG TABS tablet Take 500 mcg by mouth daily.     No current facility-administered medications for this visit.    No Known Allergies  Review of Systems   Improved appetite and strength Nonsmoking No productive cough fever or shortness of breath  BP 114/78 mmHg  Pulse 64  Resp 16  Ht 5\' 8"  (1.727 m)  Wt 152 lb (68.947 kg)  BMI 23.12 kg/m2  SpO2 95% Physical Exam Alert and comfortable suboptimal dentition but teeth are not loose or grossly infected. The patient is trying to take care of his dental disease now and has an appointment for dental cleaning in the near future. Lungs clear bilaterally Right VATS incision well-healed Are regular without gallop or murmur Neuro intact  Diagnostic Tests: CT scan of chest performed late June 2016 independently reviewed  showing resolution of pleural disease and decrease in size of right upper lobe cavitation.  Impression: Resolution of right empyema Improvement of right upper lobe inflammatory cavitary lesion Would not recommend foldable extraction at this point  Plan: No limitations to activity after August 1 Patient will return as needed. Final narcotic prescription for postthoracotomy pain provided-patient understands he will not receive any other narcotic prescriptions.  Len Childs, MD [facility] [phone]

## 2015-05-16 ENCOUNTER — Telehealth: Payer: Self-pay | Admitting: Emergency Medicine

## 2015-05-16 MED ORDER — DOXYCYCLINE HYCLATE 100 MG PO TABS: 100.0000 mg | ORAL_TABLET | Freq: Two times a day (BID) | ORAL | Status: DC

## 2015-05-16 NOTE — Telephone Encounter (Signed)
Could be from the viral illness or could be related to the levaquin. Would continue the prednisone, change levaquin to doxycycline 100mg  bid x 7 days if he is willing to try the switch. May tolerate the doxy better. Also try Tylenol cold and flu or Theraflu for symptomatic relief.

## 2015-05-16 NOTE — Telephone Encounter (Signed)
Patient's wife notified. Rx sent to pharmacy. Nothing further needed.

## 2015-05-16 NOTE — Telephone Encounter (Signed)
Called spoke with pt spouse. Pt saw PCP yesterday and was rx'd levaquin 750 QD x 7 days and prednisone 10 mg BID x 7 days. Pt was having prod cough (clear phlem), wheezing, congestion, warm to touch, tired. Last night pt took ABX and started vomiting and has been all night. She reports pt took this before and had same reaction. Only 2 providers in office this afternoon. Please advise RB thanks  No Known Allergies   Current Outpatient Prescriptions on File Prior to Visit  Medication Sig Dispense Refill  . Aclidinium Bromide 400 MCG/ACT AEPB Inhale 2 puffs into the lungs 2 (two) times daily. [place]    . albuterol (PROVENTIL HFA;VENTOLIN HFA) 108 (90 BASE) MCG/ACT inhaler Inhale 2 puffs into the lungs every 6 (six) hours as needed for wheezing.    Marland Kitchen ALPRAZolam (XANAX) 1 MG tablet Take 1 tablet (1 mg total) by mouth 2 (two) times daily as needed for anxiety. (Patient taking differently: Take 1 mg by mouth 3 (three) times daily. ) 20 tablet 0  . mometasone-formoterol (DULERA) 200-5 MCG/ACT AERO Inhale 2 puffs into the lungs 2 (two) times daily.    Marland Kitchen oxyCODONE-acetaminophen (PERCOCET) 10-325 MG per tablet Take 1 tablet by mouth See admin instructions. Take 1 tablet by mouth every 5 hours, scheduled    . roflumilast (DALIRESP) 500 MCG TABS tablet Take 500 mcg by mouth daily.     No current facility-administered medications on file prior to visit.

## 2015-08-18 ENCOUNTER — Telehealth: Payer: Self-pay | Admitting: Emergency Medicine

## 2015-08-18 NOTE — Telephone Encounter (Signed)
Called and spoke to pt's wife. Pt's wife questioning if a CT scan is needed prior to next appt. Per RB's last OV note and telephone encounters the pt only needs CXR. Pt's wife verbalized understanding and denied any further questions or concerns at this time.    Patient Instructions     Your CT scan of the chest shows significant improvement compared with 12/29/2014. Do not see any evidence of residual pneumonia or abscess. There is no residual infection surrounding the lung.  We will plan to repeat chest x-rays one to 2 times a year to follow for any changes We will perform blood work today Please continue Cre[place] Corn, daliresp as you are taking them  Follow with Dr Lamonte Sakai in 6 months or sooner if you have any problems

## 2015-09-16 ENCOUNTER — Encounter: Payer: Self-pay | Admitting: Emergency Medicine

## 2015-09-16 ENCOUNTER — Ambulatory Visit (INDEPENDENT_AMBULATORY_CARE_PROVIDER_SITE_OTHER): Payer: Medicare Other | Admitting: Emergency Medicine

## 2015-09-16 VITALS — BP 102/76 | HR 65 | Ht 66.0 in | Wt 148.0 lb

## 2015-09-16 DIAGNOSIS — J189 Pneumonia, unspecified organism: Secondary | ICD-10-CM | POA: Diagnosis not present

## 2015-09-16 DIAGNOSIS — J984 Other disorders of lung: Secondary | ICD-10-CM

## 2015-09-16 DIAGNOSIS — J449 Chronic obstructive pulmonary disease, unspecified: Secondary | ICD-10-CM | POA: Insufficient documentation

## 2015-09-16 NOTE — Assessment & Plan Note (Signed)
Discussed the importance of complete smoking cessation with him today. For now we will continue current bronchodilators. We will perform full pulmonary function testing as next office visit. Walking oximetry today given his occasional lightheadedness with walking and bending

## 2015-09-16 NOTE — Progress Notes (Signed)
Subjective:    Patient ID: Jason Ballard, male    DOB: [birth year], 50 y.o.   MRN: [id number]  HPI 50 year old male former heavy  smoker, quit April 2016, seen for pulmonary consult for cavitary pneumonia/empyema requiring VATS Initially admitted April 11 through 12/24/2014 for empyema with cavitary pneumonia and pneumothorax. Readmitted May 2 for sepsis secondary to pneumonia.  post hospital follow-up Patient returns for a post hospital follow-up Patient recently had 2 hospitalizations. Initially admitted April 11 through April 26 4 empyema with right-sided cavitary pneumonia and pneumothorax. He underwent a RIGHT VATS with drainage of large empyema and decortication of the right lower lobe on April 22. He was discharged on Augmentin for 1 month Patient return back to the emergency room with fever, shortness of breath and suspected sepsis secondary to right-sided pneumonia. He was treated with aggressive IV anabiotic's and discharged on Levaquin and Flagyl for 2 additional weeks. Since discharge. Patient says he is slowly improving. Cough and congestion are decreasing. Patient has previously been followed by pulmonary and [place], [place] . However, says that he does not want to go back there. He is on [place], Daliresp and nocturnal oxygen. Patient has not smoked since discharge He has finished his anabiotic's. He denies any fever, discolored mucus, chest pain, orthopnea, PND, leg swelling, nausea, vomiting, diarrhea. Appetite is starting to return. He has had significant weight loss. Chest x-ray today shows improvement in right sided consolidation.  Patient has a former history of heavy smoking. History of IV drug use. Chronic pain syndrome with previous neck surgery on chronic narcotics.  ROV 02/21/15 -- follow-up visit for history of tobacco use,necrotic right lower lobe pneumonia requiring VATS decortication on 12/20/2014. His last CT scan of the chest was 02/18/15  compared with 12/29/14. I reviewed the new scan which shows that his right upper lobe cavitary lesion is improved. The lesion now measures 5.3 x 4.3 cm, is thin-walled and does not appear to have any air-fluid level. No obvious pleural disease.   He is on [place], daliresp, albuterol.   ROV1/17/17 -- follow-up visit for abnormal CT scan of the chest due to necrotic right lower lobe pneumonia, also with a history of tobacco use and presumed COPD. He has been managed on [place]. Still smokes a few occasionally.  He has done well, denies any fevers, chills, sputum. He does use albuterol prn, not every day. Remains active. He had back imaging for an injury at [place] that made comment about a R sided abnormality. Still some paraesthesia at chest tube site.  I personally reviewed MRI spine [facility] from 09/05/15 that did show a right lower lobe opacity but could not be directly compared to his old CTs. descxribes some occasional lightheadedness w bending and exertion   Review of Systems As per HPI.      Objective:   Physical Exam  Filed Vitals:   09/16/15 0857  BP: 102/76  Pulse: 65  Height: 5\' 6"  (1.676 m)  Weight: 148 lb (67.132 kg)  SpO2: 92%   Gen: Pleasant, well-nourished, in no distress,  normal affect  ENT: No lesions,  mouth clear,  oropharynx clear, no postnasal drip  Neck: No JVD, no TMG, no carotid bruits  Lungs: No use of accessory muscles, clear without rales or rhonchi, insp squeak on R  Cardiovascular: RRR, heart sounds normal, no murmur or gallops, no peripheral edema  Musculoskeletal: No deformities, no cyanosis or clubbing  Neuro: alert, non focal  Skin: Warm,  no lesions or rashes   02/18/15 --  COMPARISON: CT 12/29/2014  FINDINGS: Mediastinum/Nodes: No axillary supraclavicular lymphadenopathy. Small mediastinal lymph nodes are not pathologic by size criteria decreased in volume compared to prior. No pericardial fluid. Esophagus  normal.  Lungs/Pleura: There is improvement in the right upper lobe cavitary lesion seen on comparison exam. Now lesion is a thin walled multilobulated cystic lesion with thin internal septations measuring 5.3 x 4.3 cm. Previously the lesion was consolidative with air-fluid levels measuring 6.2 x 4.8 cm. There is resolution of the fluid component and parenchymal inflammation.  There scattered branching nodules in the lef tlower lobe for less than 5 mm (image 39 through 44 series 3) are also improved.  Upper abdomen: Limited view of the liver, kidneys, pancreas are unremarkable. Normal adrenal glands.  Musculoskeletal: No aggressive osseous lesion.  IMPRESSION: 1. Marked improvement in previously seen cavitary lesion with consolidation air-fluid levels in the right upper lobe. Lesion is now thin walled with out significant inflammation. 2. Improvement in bibasilar airspace nodularity also consists with improved pulmonary infectious pattern. Mild nodularity remains in the left lung base. 3. Small mediastinal lymph nodes are not pathologic by size criteria improved from prior.     Assessment & Plan:  Cavitary pneumonia Right lower lobe cavitary lesion. Clinically stable. He'll likely need repeat imaging over the next several months in order to ensure no interval change. We will arrange the timing of this at his next office visit  COPD (chronic obstructive pulmonary disease) ([place]) Discussed the importance of complete smoking cessation with him today. For now we will continue current bronchodilators. We will perform full pulmonary function testing as next office visit. Walking oximetry today given his occasional lightheadedness with walking and bending

## 2015-09-16 NOTE — Patient Instructions (Addendum)
Walking oximetry today Please continue Jason Ballard and Jason Ballard as you are taking them  Take albuterol 2 puffs up to every 4 hours if needed for shortness of breath.  We will discuss the timing of any repeat scan of your chest at your next visit We will perform full pulmonary function testing at your next visit Follow with Jason Ballard in 6 months with full PFT or sooner if you have any problems

## 2015-09-16 NOTE — Assessment & Plan Note (Signed)
Right lower lobe cavitary lesion. Clinically stable. He'll likely need repeat imaging over the next several months in order to ensure no interval change. We will arrange the timing of this at his next office visit

## 2015-10-08 ENCOUNTER — Emergency Department (HOSPITAL_COMMUNITY)
Admission: EM | Admit: 2015-10-08 | Discharge: 2015-10-09 | Disposition: A | Discharge status: Home / Self Care | Payer: Medicare Other | Attending: Emergency Medicine | Admitting: Emergency Medicine

## 2015-10-08 ENCOUNTER — Encounter (HOSPITAL_COMMUNITY): Payer: Self-pay | Admitting: *Deleted

## 2015-10-08 DIAGNOSIS — M5442 Lumbago with sciatica, left side: Secondary | ICD-10-CM | POA: Insufficient documentation

## 2015-10-08 DIAGNOSIS — J439 Emphysema, unspecified: Secondary | ICD-10-CM | POA: Diagnosis not present

## 2015-10-08 DIAGNOSIS — Z7951 Long term (current) use of inhaled steroids: Secondary | ICD-10-CM | POA: Insufficient documentation

## 2015-10-08 DIAGNOSIS — M545 Low back pain: Secondary | ICD-10-CM | POA: Diagnosis present

## 2015-10-08 DIAGNOSIS — Z79899 Other long term (current) drug therapy: Secondary | ICD-10-CM | POA: Insufficient documentation

## 2015-10-08 DIAGNOSIS — Z87891 Personal history of nicotine dependence: Secondary | ICD-10-CM | POA: Insufficient documentation

## 2015-10-08 MED ORDER — CYCLOBENZAPRINE HCL 10 MG PO TABS
5.0000 mg | ORAL_TABLET | Freq: Once | ORAL | Status: AC
  Administered 2015-10-08: 5 mg via ORAL
  Filled 2015-10-08: qty 1

## 2015-10-08 MED ORDER — TRAMADOL HCL 50 MG PO TABS
50.0000 mg | ORAL_TABLET | Freq: Once | ORAL | Status: AC
  Administered 2015-10-08: 50 mg via ORAL
  Filled 2015-10-08: qty 1

## 2015-10-08 MED ORDER — HYDROMORPHONE HCL 1 MG/ML IJ SOLN
1.0000 mg | Freq: Once | INTRAMUSCULAR | Status: AC
  Administered 2015-10-08: 1 mg via INTRAVENOUS
  Filled 2015-10-08: qty 1

## 2015-10-08 MED ORDER — KETOROLAC TROMETHAMINE 15 MG/ML IJ SOLN
15.0000 mg | Freq: Once | INTRAMUSCULAR | Status: AC
  Administered 2015-10-08: 15 mg via INTRAVENOUS
  Filled 2015-10-08: qty 1

## 2015-10-08 MED ORDER — SODIUM CHLORIDE 0.9 % IV BOLUS (SEPSIS)
1000.0000 mL | Freq: Once | INTRAVENOUS | Status: AC
  Administered 2015-10-08: 1000 mL via INTRAVENOUS

## 2015-10-08 NOTE — ED Notes (Signed)
Called Dr. Lanetta Inch in regards to pain unchanged. MD acknowledges, awaiting new orders. Reveiwed plan of care, pain control while here in the ER, then to follow up as an outpatient. Explained to the patient.

## 2015-10-08 NOTE — ED Notes (Signed)
Called Dr. Lanetta Inch to request pain medication. Awaiting orders.

## 2015-10-08 NOTE — ED Notes (Signed)
Pt reports severe lower back pain that radiates down left leg. Feels like leg gives out on him and pt unable to ambulate. Reports having MRI done last week.

## 2015-10-08 NOTE — ED Provider Notes (Signed)
CSN: [id number]     Arrival date & time 10/08/15  1719 History   First MD Initiated Contact with Patient 10/08/15 2055     Chief Complaint  Patient presents with  . Back Pain  . Leg Pain     (Consider location/radiation/quality/duration/timing/severity/associated sxs/prior Treatment) Patient is a 50 y.o. male presenting with back pain. The history is provided by the patient.  Back Pain Location:  Lumbar spine Quality:  Shooting Radiates to:  L posterior upper leg Pain severity:  Severe Pain is:  Same all the time Onset quality:  Gradual Duration: 3 months but worsening over 3 days. Timing:  Constant Progression:  Waxing and waning Chronicity:  New Context: twisting   Relieved by:  Lying down Worsened by:  Bending and twisting Ineffective treatments:  None tried Associated symptoms: no bladder incontinence, no bowel incontinence, no dysuria and no fever     Past Medical History  Diagnosis Date  . COPD (chronic obstructive pulmonary disease) ([place])   . Asthma   . Emphysema    Past Surgical History  Procedure Laterality Date  . Back surgery  2008    neck surgery  . Nerve replacement in right elbow, 2008    . Appendectomy    . Anterior cervical decomp/discectomy fusion N/A 05/03/2013    Procedure: ANTERIOR CERVICAL DECOMPRESSION/DISCECTOMY FUSION Cervical Three-Four;  Surgeon: Faythe Ghee, MD;  Location: [facility];  Service: Neurosurgery;  Laterality: N/A;  . Video assisted thoracoscopy (vats)/empyema Right 12/19/2014    Procedure: VIDEO ASSISTED THORACOSCOPY/right thoracotomy (VATS)/DRAIN EMPYEMA;  Surgeon: Ivin Poot, MD;  [facility];  Service: Thoracic;  Laterality: Right;   Family History  Problem Relation Age of Onset  . Diabetes Mellitus II Neg Hx   . Stroke Neg Hx    Social History  Substance Use Topics  . Smoking status: Former Smoker -- 1.00 packs/day for 33 years    Types: Cigarettes    Quit date: 08/07/2012  . Smokeless tobacco: Never Used   . Alcohol Use: Yes     Comment: occasional    Review of Systems  Constitutional: Negative for fever.  HENT: Negative.   Eyes: Negative for visual disturbance.  Respiratory: Negative.   Cardiovascular: Negative.   Gastrointestinal: Negative.  Negative for bowel incontinence.  Genitourinary: Negative for bladder incontinence and dysuria.  Musculoskeletal: Positive for back pain.  Skin: Negative.   Neurological: Negative.       Allergies  Levaquin  Home Medications   Prior to Admission medications   Medication Sig Start Date End Date Taking? Authorizing Provider  Aclidinium Bromide 400 MCG/ACT AEPB Inhale 2 puffs into the lungs 2 (two) times daily. [place]   Yes Historical Provider, MD  albuterol (PROVENTIL HFA;VENTOLIN HFA) 108 (90 BASE) MCG/ACT inhaler Inhale 2 puffs into the lungs every 6 (six) hours as needed for wheezing.   Yes Historical Provider, MD  alendronate (FOSAMAX) 70 MG tablet Take 1 tablet by mouth once a week. Friday 09/13/15  Yes Historical Provider, MD  buPROPion (WELLBUTRIN XL) 150 MG 24 hr tablet Take 1 tablet by mouth at bedtime. 08/21/15  Yes Historical Provider, MD  cyclobenzaprine (FLEXERIL) 10 MG tablet Take 10 mg by mouth at bedtime as needed for muscle spasms.  10/06/15  Yes Historical Provider, MD  LYRICA 50 MG capsule Take 50 mg of amoxicillin by mouth 3 (three) times daily. 10/06/15  Yes Historical Provider, MD  mometasone-formoterol (DULERA) 200-5 MCG/ACT AERO Inhale 2 puffs into the lungs 2 (two) times  daily.   Yes Historical Provider, MD  Oxycodone HCl 20 MG TABS Take 1 tablet by mouth every 6 (six) hours as needed. 09/13/15  Yes Historical Provider, MD  pramipexole (MIRAPEX) 1 MG tablet Take 2 mg by mouth at bedtime.   Yes Historical Provider, MD  roflumilast (DALIRESP) 500 MCG TABS tablet Take 500 mcg by mouth daily.   Yes Historical Provider, MD  oxyCODONE-acetaminophen (PERCOCET/ROXICET) 5-325 MG tablet Take 1-2 tablets by mouth every 8 (eight) hours  as needed for severe pain. 10/09/15   Heriberto Antigua, MD   BP 110/69 mmHg  Pulse 92  Temp(Src) 97.3 F (36.3 C) (Oral)  Resp 18  SpO2 95% Physical Exam  Constitutional: He is oriented to person, place, and time. He appears well-developed and well-nourished. No distress.  HENT:  Head: Normocephalic and atraumatic.  Eyes: EOM are normal. Pupils are equal, round, and reactive to light.  Neck: Normal range of motion. Neck supple.  Cardiovascular: Normal rate, regular rhythm, normal heart sounds and intact distal pulses.   Pulmonary/Chest: Effort normal and breath sounds normal. He exhibits no tenderness.  Abdominal: Soft. He exhibits no distension. There is no tenderness. There is no rebound and no guarding.  Musculoskeletal: He exhibits no tenderness.  Positive leg raise test on the left. left-sided lower back pain with palpation  Neurological: He is alert and oriented to person, place, and time. No cranial nerve deficit or sensory deficit. He exhibits normal muscle tone. He displays no seizure activity. Coordination normal.  Normal strength throughout lower extremities  Skin: Skin is warm and dry. No rash noted. He is not diaphoretic. No erythema. No pallor.  Nursing note and vitals reviewed.   ED Course  Procedures (including critical care time) Labs Review Labs Reviewed  URINALYSIS, ROUTINE W REFLEX MICROSCOPIC (NOT AT [facility])    Imaging Review No results found. I have personally reviewed and evaluated these images and lab results as part of my medical decision-making.   EKG Interpretation None      MDM   Final diagnoses:  Left-sided low back pain with left-sided sciatica    Patient is a 50 year old male who presents with left-sided lower back pain and sciatica. He reports that he herniated a disc in November and has chronic symptoms but over the past 3-4 days has much worsened. He describes shooting sharp pains radiating from his lower back down to his left foot. Reports he  had an MRI within the past week that showed worsening degenerative disc disease. No saddle anesthesia or loss of bowel or bladder. Further history of exam as above notable for stable vital signs, positive leg raise test on the left but otherwise intact sensation and strength.  Likely continued pain from DDD. Doubt cord compression or cauda equina syndrome at this time. Post void residual obtained which was 150cc. ua without evidence of stone or uti.  I have reviewed all workup. Patient stable for discharge home.  I have reviewed all results with the patient. Advised to f/u with his spine team in 1-2 days. Advised continued symptomatic management. Patient agrees to stated plan. All questions answered. Advised to call or return to have any questions, new symptoms, change in symptoms, or symptoms that they do not understand.   Heriberto Antigua, MD 10/09/15 0111  Gareth Morgan, MD 10/10/15 [phone]

## 2015-10-08 NOTE — ED Notes (Signed)
Reported post void residual to Dr. Lanetta Inch. MD acknowledges.

## 2015-10-08 NOTE — ED Notes (Signed)
Dr. Woodrum at the bedside.  

## 2015-10-09 LAB — URINALYSIS, ROUTINE W REFLEX MICROSCOPIC
BILIRUBIN URINE: NEGATIVE
GLUCOSE, UA: NEGATIVE mg/dL
HGB URINE DIPSTICK: NEGATIVE
Ketones, ur: NEGATIVE mg/dL
Leukocytes, UA: NEGATIVE
Nitrite: NEGATIVE
Protein, ur: NEGATIVE mg/dL
SPECIFIC GRAVITY, URINE: 1.009 (ref 1.005–1.030)
pH: 7 (ref 5.0–8.0)

## 2015-10-09 MED ORDER — OXYCODONE-ACETAMINOPHEN 5-325 MG PO TABS: 1.0000 | ORAL_TABLET | Freq: Three times a day (TID) | ORAL | Status: DC | PRN

## 2015-10-09 NOTE — Discharge Instructions (Signed)
Sciatica Sciatica is pain, weakness, numbness, or tingling along your sciatic nerve. The nerve starts in the lower back and runs down the back of each leg. Nerve damage or certain conditions pinch or put pressure on the sciatic nerve. This causes the pain, weakness, and other discomforts of sciatica. HOME CARE   Only take medicine as told by your doctor.  Apply ice to the affected area for 20 minutes. Do this 3-4 times a day for the first 48-72 hours. Then try heat in the same way.  Exercise, stretch, or do your usual activities if these do not make your pain worse.  Go to physical therapy as told by your doctor.  Keep all doctor visits as told.  Do not wear high heels or shoes that are not supportive.  Get a firm mattress if your mattress is too soft to lessen pain and discomfort. GET HELP RIGHT AWAY IF:   You cannot control when you poop (bowel movement) or pee (urinate).  You have more weakness in your lower back, lower belly (pelvis), butt (buttocks), or legs.  You have redness or puffiness (swelling) of your back.  You have a burning feeling when you pee.  You have pain that gets worse when you lie down.  You have pain that wakes you from your sleep.  Your pain is worse than past pain.  Your pain lasts longer than 4 weeks.  You are suddenly losing weight without reason. MAKE SURE YOU:   Understand these instructions.  Will watch this condition.  Will get help right away if you are not doing well or get worse.   This information is not intended to replace advice given to you by your health care provider. Make sure you discuss any questions you have with your health care provider.   Document Released: 05/25/2008 Document Revised: 05/07/2015 Document Reviewed: 12/26/2011 Elsevier Interactive Patient Education 2016 [place] The sciatic nerve runs from the back down the leg and is responsible for sensation and control of the muscles in the  back (posterior) side of the thigh, lower leg, and foot. Sciatica is a condition that is characterized by inflammation of this nerve.  SYMPTOMS   Signs of nerve damage, including numbness and/or weakness along the posterior side of the lower extremity.  Pain in the back of the thigh that may also travel down the leg.  Pain that worsens when sitting for long periods of time.  Occasionally, pain in the back or buttock. CAUSES  Inflammation of the sciatic nerve is the cause of sciatica. The inflammation is due to something irritating the nerve. Common sources of irritation include:  Sitting for long periods of time.  Direct trauma to the nerve.  Arthritis of the spine.  Herniated or ruptured disk.  Slipping of the vertebrae (spondylolisthesis).  Pressure from soft tissues, such as muscles or ligament-like tissue (fascia). RISK INCREASES WITH:  Sports that place pressure or stress on the spine (football or weightlifting).  Poor strength and flexibility.  Failure to warm up properly before activity.  Family history of low back pain or disk disorders.  Previous back injury or surgery.  Poor body mechanics, especially when lifting, or poor posture. PREVENTION   Warm up and stretch properly before activity.  Maintain physical fitness:  Strength, flexibility, and endurance.  Cardiovascular fitness.  Learn and use proper technique, especially with posture and lifting. When possible, have coach correct improper technique.  Avoid activities that place stress on the spine.  PROGNOSIS If treated properly, then sciatica usually resolves within 6 weeks. However, occasionally surgery is necessary.  RELATED COMPLICATIONS   Permanent nerve damage, including pain, numbness, tingle, or weakness.  Chronic back pain.  Risks of surgery: infection, bleeding, nerve damage, or damage to surrounding tissues. TREATMENT Treatment initially involves resting from any activities that  aggravate your symptoms. The use of ice and medication may help reduce pain and inflammation. The use of strengthening and stretching exercises may help reduce pain with activity. These exercises may be performed at home or with referral to a therapist. A therapist may recommend further treatments, such as transcutaneous electronic nerve stimulation (TENS) or ultrasound. Your caregiver may recommend corticosteroid injections to help reduce inflammation of the sciatic nerve. If symptoms persist despite non-surgical (conservative) treatment, then surgery may be recommended. MEDICATION  If pain medication is necessary, then nonsteroidal anti-inflammatory medications, such as aspirin and ibuprofen, or other minor pain relievers, such as acetaminophen, are often recommended.  Do not take pain medication for 7 days before surgery.  Prescription pain relievers may be given if deemed necessary by your caregiver. Use only as directed and only as much as you need.  Ointments applied to the skin may be helpful.  Corticosteroid injections may be given by your caregiver. These injections should be reserved for the most serious cases, because they may only be given a certain number of times. HEAT AND COLD  Cold treatment (icing) relieves pain and reduces inflammation. Cold treatment should be applied for 10 to 15 minutes every 2 to 3 hours for inflammation and pain and immediately after any activity that aggravates your symptoms. Use ice packs or massage the area with a piece of ice (ice massage).  Heat treatment may be used prior to performing the stretching and strengthening activities prescribed by your caregiver, physical therapist, or athletic trainer. Use a heat pack or soak the injury in warm water. SEEK MEDICAL CARE IF:  Treatment seems to offer no benefit, or the condition worsens.  Any medications produce adverse side effects. EXERCISES  RANGE OF MOTION (ROM) AND STRETCHING EXERCISES -  Sciatica Most people with sciatic will find that their symptoms worsen with either excessive bending forward (flexion) or arching at the low back (extension). The exercises which will help resolve your symptoms will focus on the opposite motion. Your physician, physical therapist or athletic trainer will help you determine which exercises will be most helpful to resolve your low back pain. Do not complete any exercises without first consulting with your clinician. Discontinue any exercises which worsen your symptoms until you speak to your clinician. If you have pain, numbness or tingling which travels down into your buttocks, leg or foot, the goal of the therapy is for these symptoms to move closer to your back and eventually resolve. Occasionally, these leg symptoms will get better, but your low back pain may worsen; this is typically an indication of progress in your rehabilitation. Be certain to be very alert to any changes in your symptoms and the activities in which you participated in the 24 hours prior to the change. Sharing this information with your clinician will allow him/her to most efficiently treat your condition. These exercises may help you when beginning to rehabilitate your injury. Your symptoms may resolve with or without further involvement from your physician, physical therapist or athletic trainer. While completing these exercises, remember:   Restoring tissue flexibility helps normal motion to return to the joints. This allows healthier, less painful movement and activity.  An effective stretch should be held for at least 30 seconds.  A stretch should never be painful. You should only feel a gentle lengthening or release in the stretched tissue. FLEXION RANGE OF MOTION AND STRETCHING EXERCISES: STRETCH - Flexion, Single Knee to Chest   Lie on a firm bed or floor with both legs extended in front of you.  Keeping one leg in contact with the floor, bring your opposite knee to your  chest. Hold your leg in place by either grabbing behind your thigh or at your knee.  Pull until you feel a gentle stretch in your low back. Hold __________ seconds.  Slowly release your grasp and repeat the exercise with the opposite side. Repeat __________ times. Complete this exercise __________ times per day.  STRETCH - Flexion, Double Knee to Chest  Lie on a firm bed or floor with both legs extended in front of you.  Keeping one leg in contact with the floor, bring your opposite knee to your chest.  Tense your stomach muscles to support your back and then lift your other knee to your chest. Hold your legs in place by either grabbing behind your thighs or at your knees.  Pull both knees toward your chest until you feel a gentle stretch in your low back. Hold __________ seconds.  Tense your stomach muscles and slowly return one leg at a time to the floor. Repeat __________ times. Complete this exercise __________ times per day.  STRETCH - Low Trunk Rotation   Lie on a firm bed or floor. Keeping your legs in front of you, bend your knees so they are both pointed toward the ceiling and your feet are flat on the floor.  Extend your arms out to the side. This will stabilize your upper body by keeping your shoulders in contact with the floor.  Gently and slowly drop both knees together to one side until you feel a gentle stretch in your low back. Hold for __________ seconds.  Tense your stomach muscles to support your low back as you bring your knees back to the starting position. Repeat the exercise to the other side. Repeat __________ times. Complete this exercise __________ times per day  EXTENSION RANGE OF MOTION AND FLEXIBILITY EXERCISES: STRETCH - Extension, Prone on Elbows  Lie on your stomach on the floor, a bed will be too soft. Place your palms about shoulder width apart and at the height of your head.  Place your elbows under your shoulders. If this is too painful, stack  pillows under your chest.  Allow your body to relax so that your hips drop lower and make contact more completely with the floor.  Hold this position for __________ seconds.  Slowly return to lying flat on the floor. Repeat __________ times. Complete this exercise __________ times per day.  RANGE OF MOTION - Extension, Prone Press Ups  Lie on your stomach on the floor, a bed will be too soft. Place your palms about shoulder width apart and at the height of your head.  Keeping your back as relaxed as possible, slowly straighten your elbows while keeping your hips on the floor. You may adjust the placement of your hands to maximize your comfort. As you gain motion, your hands will come more underneath your shoulders.  Hold this position __________ seconds.  Slowly return to lying flat on the floor. Repeat __________ times. Complete this exercise __________ times per day.  STRENGTHENING EXERCISES - Sciatica  These exercises may help you when  beginning to rehabilitate your injury. These exercises should be done near your "sweet spot." This is the neutral, low-back arch, somewhere between fully rounded and fully arched, that is your least painful position. When performed in this safe range of motion, these exercises can be used for people who have either a flexion or extension based injury. These exercises may resolve your symptoms with or without further involvement from your physician, physical therapist or athletic trainer. While completing these exercises, remember:   Muscles can gain both the endurance and the strength needed for everyday activities through controlled exercises.  Complete these exercises as instructed by your physician, physical therapist or athletic trainer. Progress with the resistance and repetition exercises only as your caregiver advises.  You may experience muscle soreness or fatigue, but the pain or discomfort you are trying to eliminate should never worsen during  these exercises. If this pain does worsen, stop and make certain you are following the directions exactly. If the pain is still present after adjustments, discontinue the exercise until you can discuss the trouble with your clinician. STRENGTHENING - Deep Abdominals, Pelvic Tilt   Lie on a firm bed or floor. Keeping your legs in front of you, bend your knees so they are both pointed toward the ceiling and your feet are flat on the floor.  Tense your lower abdominal muscles to press your low back into the floor. This motion will rotate your pelvis so that your tail bone is scooping upwards rather than pointing at your feet or into the floor.  With a gentle tension and even breathing, hold this position for __________ seconds. Repeat __________ times. Complete this exercise __________ times per day.  STRENGTHENING - Abdominals, Crunches   Lie on a firm bed or floor. Keeping your legs in front of you, bend your knees so they are both pointed toward the ceiling and your feet are flat on the floor. Cross your arms over your chest.  Slightly tip your chin down without bending your neck.  Tense your abdominals and slowly lift your trunk high enough to just clear your shoulder blades. Lifting higher can put excessive stress on the low back and does not further strengthen your abdominal muscles.  Control your return to the starting position. Repeat __________ times. Complete this exercise __________ times per day.  STRENGTHENING - Quadruped, Opposite UE/LE Lift  Assume a hands and knees position on a firm surface. Keep your hands under your shoulders and your knees under your hips. You may place padding under your knees for comfort.  Find your neutral spine and gently tense your abdominal muscles so that you can maintain this position. Your shoulders and hips should form a rectangle that is parallel with the floor and is not twisted.  Keeping your trunk steady, lift your right hand no higher than  your shoulder and then your left leg no higher than your hip. Make sure you are not holding your breath. Hold this position __________ seconds.  Continuing to keep your abdominal muscles tense and your back steady, slowly return to your starting position. Repeat with the opposite arm and leg. Repeat __________ times. Complete this exercise __________ times per day.  STRENGTHENING - Abdominals and Quadriceps, Straight Leg Raise   Lie on a firm bed or floor with both legs extended in front of you.  Keeping one leg in contact with the floor, bend the other knee so that your foot can rest flat on the floor.  Find your neutral spine, and  tense your abdominal muscles to maintain your spinal position throughout the exercise.  Slowly lift your straight leg off the floor about 6 inches for a count of 15, making sure to not hold your breath.  Still keeping your neutral spine, slowly lower your leg all the way to the floor. Repeat this exercise with each leg __________ times. Complete this exercise __________ times per day. POSTURE AND BODY MECHANICS CONSIDERATIONS - Sciatica Keeping correct posture when sitting, standing or completing your activities will reduce the stress put on different body tissues, allowing injured tissues a chance to heal and limiting painful experiences. The following are general guidelines for improved posture. Your physician or physical therapist will provide you with any instructions specific to your needs. While reading these guidelines, remember:  The exercises prescribed by your provider will help you have the flexibility and strength to maintain correct postures.  The correct posture provides the optimal environment for your joints to work. All of your joints have less wear and tear when properly supported by a spine with good posture. This means you will experience a healthier, less painful body.  Correct posture must be practiced with all of your activities, especially  prolonged sitting and standing. Correct posture is as important when doing repetitive low-stress activities (typing) as it is when doing a single heavy-load activity (lifting). RESTING POSITIONS Consider which positions are most painful for you when choosing a resting position. If you have pain with flexion-based activities (sitting, bending, stooping, squatting), choose a position that allows you to rest in a less flexed posture. You would want to avoid curling into a fetal position on your side. If your pain worsens with extension-based activities (prolonged standing, working overhead), avoid resting in an extended position such as sleeping on your stomach. Most people will find more comfort when they rest with their spine in a more neutral position, neither too rounded nor too arched. Lying on a non-sagging bed on your side with a pillow between your knees, or on your back with a pillow under your knees will often provide some relief. Keep in mind, being in any one position for a prolonged period of time, no matter how correct your posture, can still lead to stiffness. PROPER SITTING POSTURE In order to minimize stress and discomfort on your spine, you must sit with correct posture Sitting with good posture should be effortless for a healthy body. Returning to good posture is a gradual process. Many people can work toward this most comfortably by using various supports until they have the flexibility and strength to maintain this posture on their own. When sitting with proper posture, your ears will fall over your shoulders and your shoulders will fall over your hips. You should use the back of the chair to support your upper back. Your low back will be in a neutral position, just slightly arched. You may place a small pillow or folded towel at the base of your low back for support.  When working at a desk, create an environment that supports good, upright posture. Without extra support, muscles fatigue and  lead to excessive strain on joints and other tissues. Keep these recommendations in mind: CHAIR:   A chair should be able to slide under your desk when your back makes contact with the back of the chair. This allows you to work closely.  The chair's height should allow your eyes to be level with the upper part of your monitor and your hands to be slightly lower than  your elbows. BODY POSITION  Your feet should make contact with the floor. If this is not possible, use a foot rest.  Keep your ears over your shoulders. This will reduce stress on your neck and low back. INCORRECT SITTING POSTURES   If you are feeling tired and unable to assume a healthy sitting posture, do not slouch or slump. This puts excessive strain on your back tissues, causing more damage and pain. Healthier options include:  Using more support, like a lumbar pillow.  Switching tasks to something that requires you to be upright or walking.  Talking a brief walk.  Lying down to rest in a neutral-spine position. PROLONGED STANDING WHILE SLIGHTLY LEANING FORWARD  When completing a task that requires you to lean forward while standing in one place for a long time, place either foot up on a stationary 2-4 inch high object to help maintain the best posture. When both feet are on the ground, the low back tends to lose its slight inward curve. If this curve flattens (or becomes too large), then the back and your other joints will experience too much stress, fatigue more quickly and can cause pain.  CORRECT STANDING POSTURES Proper standing posture should be assumed with all daily activities, even if they only take a few moments, like when brushing your teeth. As in sitting, your ears should fall over your shoulders and your shoulders should fall over your hips. You should keep a slight tension in your abdominal muscles to brace your spine. Your tailbone should point down to the ground, not behind your body, resulting in an  over-extended swayback posture.  INCORRECT STANDING POSTURES  Common incorrect standing postures include a forward head, locked knees and/or an excessive swayback. WALKING Walk with an upright posture. Your ears, shoulders and hips should all line-up. PROLONGED ACTIVITY IN A FLEXED POSITION When completing a task that requires you to bend forward at your waist or lean over a low surface, try to find a way to stabilize 3 of 4 of your limbs. You can place a hand or elbow on your thigh or rest a knee on the surface you are reaching across. This will provide you more stability so that your muscles do not fatigue as quickly. By keeping your knees relaxed, or slightly bent, you will also reduce stress across your low back. CORRECT LIFTING TECHNIQUES DO :   Assume a wide stance. This will provide you more stability and the opportunity to get as close as possible to the object which you are lifting.  Tense your abdominals to brace your spine; then bend at the knees and hips. Keeping your back locked in a neutral-spine position, lift using your leg muscles. Lift with your legs, keeping your back straight.  Test the weight of unknown objects before attempting to lift them.  Try to keep your elbows locked down at your sides in order get the best strength from your shoulders when carrying an object.  Always ask for help when lifting heavy or awkward objects. INCORRECT LIFTING TECHNIQUES DO NOT:   Lock your knees when lifting, even if it is a small object.  Bend and twist. Pivot at your feet or move your feet when needing to change directions.  Assume that you cannot safely pick up a paperclip without proper posture.   This information is not intended to replace advice given to you by your health care provider. Make sure you discuss any questions you have with your health care provider.  Document Released: 08/16/2005 Document Revised: 12/31/2014 Document Reviewed: 11/28/2008 Elsevier Interactive  Patient Education [employer].

## 2015-10-09 NOTE — ED Notes (Signed)
Dr. Billy Fischer at the bedside.

## 2015-10-09 NOTE — ED Notes (Signed)
Patient requesting to speak to MD prior to discharge.

## 2015-10-09 NOTE — ED Notes (Signed)
Called dr. Lanetta Inch in regards to plan of care. Preparing for discharge.

## 2015-11-26 ENCOUNTER — Other Ambulatory Visit: Payer: Self-pay | Admitting: *Deleted

## 2015-11-26 DIAGNOSIS — J869 Pyothorax without fistula: Secondary | ICD-10-CM

## 2016-01-29 ENCOUNTER — Other Ambulatory Visit: Payer: Self-pay | Admitting: Neurological Surgery

## 2016-01-30 NOTE — Progress Notes (Signed)
I spoke with Jason Ballard at Dr Ditty's office to asked if patient has a pulmonary clearance. Patient does not have a clearance.  Dr Cyndy Freeze is asking for a Pulmonary clearance prior to surgery.

## 2016-02-02 ENCOUNTER — Telehealth: Payer: Self-pay | Admitting: Emergency Medicine

## 2016-02-02 ENCOUNTER — Ambulatory Visit (HOSPITAL_COMMUNITY): Admission: RE | Admit: 2016-02-02 | Payer: Medicare Other | Source: Ambulatory Visit | Admitting: Neurological Surgery

## 2016-02-02 ENCOUNTER — Encounter (HOSPITAL_COMMUNITY): Admission: RE | Payer: Self-pay | Source: Ambulatory Visit

## 2016-02-02 SURGERY — LUMBAR LAMINECTOMY/DECOMPRESSION MICRODISCECTOMY 2 LEVELS
Anesthesia: General | Laterality: Left

## 2016-02-02 NOTE — Telephone Encounter (Signed)
Spoke with pt's wife. Advised her that [facility] is not here to fill out this form. Pt has been scheduled to see TP on 02/04/16 at 9:15am for surgical clearance. Form has been given to [facility] for this appointment. Nothing further was needed.

## 2016-02-02 NOTE — Telephone Encounter (Signed)
Jason Ballard called from [facility] and they wanted to confirm we received surgery clearance form. Advised form has been received and is in Dr. Lamonte Sakai folder to be signed. -prm

## 2016-02-02 NOTE — Telephone Encounter (Signed)
Pt wife calling again to check on the status of surgery clearence.Jason Ballard

## 2016-02-02 NOTE — Telephone Encounter (Signed)
Forwarding to [place] to follow up on.

## 2016-02-02 NOTE — Telephone Encounter (Signed)
Spoke with pt wife Jason Ballard - pt was scheduled for surgery today and it was cancelled because they did not received clearance form back from our office. I advised that we did not receive a clearance form from their office and apologize for the surgery being cancelled. Pt wife asked that I contact Dr Ditty's office to discuss the surgery and getting clearance.   Called Dr Ditty's [facility]) office, spoke with Hassan Rowan, transferred to Dr Ditty's nurses voicemail - LM to return call to triage regarding clearance.   Wife aware that I called and have LM for Dr Ditty's nurse.

## 2016-02-03 ENCOUNTER — Other Ambulatory Visit: Payer: Self-pay | Admitting: Neurological Surgery

## 2016-02-04 ENCOUNTER — Encounter (HOSPITAL_COMMUNITY): Payer: Self-pay | Admitting: *Deleted

## 2016-02-04 ENCOUNTER — Ambulatory Visit (INDEPENDENT_AMBULATORY_CARE_PROVIDER_SITE_OTHER): Payer: Medicare Other | Admitting: Adult Health

## 2016-02-04 ENCOUNTER — Encounter: Payer: Self-pay | Admitting: Adult Health

## 2016-02-04 ENCOUNTER — Ambulatory Visit (INDEPENDENT_AMBULATORY_CARE_PROVIDER_SITE_OTHER)
Admission: RE | Admit: 2016-02-04 | Discharge: 2016-02-04 | Disposition: A | Discharge status: Home / Self Care | Payer: Medicare Other | Source: Ambulatory Visit | Attending: Adult Health | Admitting: Adult Health

## 2016-02-04 VITALS — BP 122/70 | HR 58 | Ht 66.0 in | Wt 154.0 lb

## 2016-02-04 DIAGNOSIS — Z01818 Encounter for other preprocedural examination: Secondary | ICD-10-CM | POA: Insufficient documentation

## 2016-02-04 DIAGNOSIS — J449 Chronic obstructive pulmonary disease, unspecified: Secondary | ICD-10-CM

## 2016-02-04 NOTE — Assessment & Plan Note (Signed)
Pulmonary Pre Op clearance  Pt is cleared for surgery . Case discussed and pt exam with Dr. Melvyn Novas   Pt with no ambulatory desaturations on O2  No recent COPD flares . Controlled well on therapy with no sign SABA use Smoking cessation encouraged.  He is at higher risk for post op complications  with low FEV1 but is very independent despite low lung function .  Discussed risk with pt and wife   Major Pulmonary risks identified in the multifactorial risk analysis are but not limited to a) pneumonia; b) recurrent intubation risk; c) prolonged or recurrent acute respiratory failure needing mechanical ventilation; d) prolonged hospitalization; e) DVT/Pulmonary embolism; f) Acute Pulmonary edema  Recommend  Short duration of surgery as much as possible and avoid paralytic if possible . DVT prophylaxis per protocol. And  Aggressive pulmonary toilet with o2, bronchodilatation, and incentive spirometry and early ambulation. Advised to limit narcotic use as able.

## 2016-02-04 NOTE — Assessment & Plan Note (Signed)
We will perform blood work today Please continue Creola Corn, daliresp as you are taking them  Follow with Dr Lamonte Sakai in 6 months or sooner if you have any problems

## 2016-02-04 NOTE — Assessment & Plan Note (Signed)
Severe COPD GOLD III  No O2 desats with ambulation on RA.  Compensated on present regimen   Plan  Continue on Symbicort and Tudorza  Completely quit smoking . Absolutely no smoking today  Chest xray today  follow up Dr. Lamonte Sakai  As planned and As needed

## 2016-02-04 NOTE — Progress Notes (Signed)
Spoke with pt's wife, Suanne Marker for pre-op call. She states pt has never had any cardiac history. Pt does have COPD but she states it's well controlled. Pulmonary clearance in EPIC as of today.   CXR - 02/04/16 - in EPIC

## 2016-02-04 NOTE — Patient Instructions (Signed)
Continue on Symbicort and Tudorza  Completely quit smoking . Absolutely no smoking today  Chest xray today  follow up Dr. Lamonte Sakai  As planned and As needed

## 2016-02-04 NOTE — Addendum Note (Signed)
Addended by: Christinia Gully B on: 02/04/2016 01:16 PM   Modules accepted: Level of Service

## 2016-02-04 NOTE — Progress Notes (Addendum)
Subjective:    Patient ID: Jason Ballard, male    DOB: [birth year], 50 y.o.   MRN: [id number]  HPI 50 year old male former heavy smoker, quit April 2016, seen for pulmonary consult for cavitary pneumonia/empyema requiring VATS 11/2014. Alpha 1 MZ with level at 112 in 2016 .  Initially admitted April 11 through 12/24/2014 for empyema with cavitary pneumonia and pneumothorax. Readmitted May 2 for sepsis secondary to pneumonia. Hx of polysubstance abuse.  Chronic pain syndrome with previous neck surgery on chronic narcotics.   02/04/2016 : Surgical clearance consultation Parrett NP/ Wert MD Pt returns for a work in visit for surgical clearance .  Has upcoming back surgery with L4-S1 laminectomy. Planned for OP surgery at [facility] Dr. Cyndy Freeze 02/05/16   Remains on Tudorza and Symbicort. And Daliresp .  Has restarted smoking. 1-2 cigs daily . Discussed total cessation.  Can walk flight of stairs without stopping . Rare use of SABA .  No COPD flare in last 6 months .  Able to lie flat at hs and no awakening/ no am exac of cough/wheeze/sob   Spirometry today in office shows FEV1 at 1.28L 36%, ratio 50, FVC 59%.  Walk test in office with no O2 desaturation on room air.  CT chest in Jan 2017 showed collapsed RUL cystic mass with most likely evolving scar.  Plans for repeat scan around 04/2016.  Chest xray today is pending .  Denies significant cough or congestion . No dyspnea at rest . Can walk good on flat surface.  No orthopnea, chest pain. Or edema.            Past Medical History  Diagnosis Date  . COPD (chronic obstructive pulmonary disease) ([place])   . Asthma   . Emphysema    Current Outpatient Prescriptions on File Prior to Visit  Medication Sig Dispense Refill  . Aclidinium Bromide 400 MCG/ACT AEPB Inhale 2 puffs into the lungs 2 (two) times daily. [place]    . albuterol (PROVENTIL HFA;VENTOLIN HFA) 108 (90 BASE) MCG/ACT inhaler Inhale 2 puffs into the lungs every 6 (six) hours  as needed for wheezing.    Marland Kitchen alendronate (FOSAMAX) 70 MG tablet Take 70 mg by mouth every Friday.     Marland Kitchen buPROPion (WELLBUTRIN XL) 150 MG 24 hr tablet Take 150 mg by mouth at bedtime.     . cyclobenzaprine (FLEXERIL) 10 MG tablet Take 10 mg by mouth at bedtime as needed for muscle spasms.     . Oxycodone HCl 20 MG TABS Take 20 mg by mouth every 6 (six) hours as needed (For pain.).     Marland Kitchen pramipexole (MIRAPEX) 1 MG tablet Take 2 mg by mouth at bedtime.    . roflumilast (DALIRESP) 500 MCG TABS tablet Take 500 mcg by mouth daily.    . SYMBICORT 160-4.5 MCG/ACT inhaler Inhale 2 puffs into the lungs 2 (two) times daily.     No current facility-administered medications on file prior to visit.     Review of Systems Constitutional:   No  weight loss, night sweats,  Fevers, chills, fatigue, or  lassitude.  HEENT:   No headaches,  Difficulty swallowing,  Tooth/dental problems, or  Sore throat,                No sneezing, itching, ear ache, nasal congestion, post nasal drip,   CV:  No chest pain,  Orthopnea, PND, swelling in lower extremities, anasarca, dizziness, palpitations, syncope.   GI  No heartburn, indigestion, abdominal pain, nausea,  vomiting, diarrhea, change in bowel habits, loss of appetite, bloody stools.   Resp:    No excess mucus, no productive cough,  No non-productive cough,  No coughing up of blood.  No change in color of mucus.  No wheezing.  No chest wall deformity  Skin: no rash or lesions.  GU: no dysuria, change in color of urine, no urgency or frequency.  No flank pain, no hematuria   MS:  ++back pain  Psych:  No change in mood or affect. No depression or anxiety.  No memory loss.         Objective:   Physical Exam Filed Vitals:   02/04/16 0909 02/04/16 0910  BP:  122/70  Pulse:  58  Height: 5\' 6"  (1.676 m)   Weight: 154 lb (69.854 kg)   SpO2:  90%   GEN: A/Ox3; pleasant , NAD   HEENT:  [place]/AT,  EACs-clear, TMs-wnl, NOSE-clear, THROAT-clear, no lesions, no  postnasal drip or exudate noted.   NECK:  Supple w/ fair ROM; no JVD; normal carotid impulses w/o bruits; no thyromegaly or nodules palpated; no lymphadenopathy.  RESP  Decreased BS in bases with pseudowheeze resolves with plmno accessory muscle use, no dullness to percussion  CARD:  RRR, no m/r/g  , no peripheral edema, pulses intact, no cyanosis or clubbing.  GI:   Soft & nt; nml bowel sounds; no organomegaly or masses detected.  Musco: Warm bil, no deformities or joint swelling noted.   Neuro: alert, no focal deficits noted.    Skin: Warm, no lesions or rashes  Tammy Parrett NP-C  [facility]  02/04/2016        Assessment & Plan:  I independently evaluated  this pt and agree with hx and exam as outlined above. There is no fixed fev1 below which non-thoracic surgery can be considered.  He has no active AB and should be able to tolerate low back surgery with the challenge being early mobilization/ min narc use and would use duoneb periop until able to resume his home meds.  Pulmonaryf/u post op is prn  Christinia Gully, MD Pulmonary and [place] [phone] After 5:30 PM or weekends, call [phone]

## 2016-02-04 NOTE — Assessment & Plan Note (Signed)
Your CT scan of the chest shows significant improvement compared with 12/29/2014. Do not see any evidence of residual pneumonia or abscess. There is no residual infection surrounding the lung.  We will plan to repeat chest x-rays one to 2 times a year to follow for any changes

## 2016-02-04 NOTE — Anesthesia Preprocedure Evaluation (Addendum)
Anesthesia Evaluation  Patient identified by MRN, date of birth, ID band Patient awake    Reviewed: Allergy & Precautions, NPO status , Patient's Chart, lab work & pertinent test results  Airway Mallampati: II  TM Distance: >3 FB Neck ROM: Full    Dental  (+) Poor Dentition, Missing,    Pulmonary asthma , pneumonia, resolved, COPD,  COPD inhaler, Current Smoker,  Hx/o cavitary pneumonia, empyema pleural effusion last year. CXR 02/04/2016 No acute findings. Stable hyperinflation and mild emphysematous disease. Resolved right upper lobe cavitary pneumonia  Pleural rub over RUL anteriorly Pulmonary exam normal  + decreased breath sounds      Cardiovascular hypertension, Normal cardiovascular exam Rhythm:Regular Rate:Normal  EKG 11/2014 NSR poss LAE   Neuro/Psych negative neurological ROS  negative psych ROS   GI/Hepatic negative GI ROS, (+)     substance abuse  ,   Endo/Other    Renal/GU negative Renal ROS  negative genitourinary   Musculoskeletal Lumbar radiculopathy     Abdominal   Peds  Hematology  (+) Sickle cell anemia and anemia ,   Anesthesia Other Findings   Reproductive/Obstetrics                        Anesthesia Physical Anesthesia Plan  ASA: III  Anesthesia Plan: General   Post-op Pain Management:    Induction: Intravenous  Airway Management Planned: Oral ETT  Additional Equipment: Arterial line  Intra-op Plan:   Post-operative Plan:   Informed Consent:   Plan Discussed with: Anesthesiologist, CRNA and Surgeon  Anesthesia Plan Comments: (Will ventilate with low Vt and increased rate.)      Anesthesia Quick Evaluation

## 2016-02-05 ENCOUNTER — Encounter (HOSPITAL_COMMUNITY)
Admission: RE | Disposition: A | Discharge status: Home / Self Care | Payer: Self-pay | Source: Ambulatory Visit | Attending: Neurological Surgery

## 2016-02-05 ENCOUNTER — Ambulatory Visit (HOSPITAL_COMMUNITY): Payer: Medicare Other | Admitting: Certified Registered Nurse Anesthetist

## 2016-02-05 ENCOUNTER — Encounter (HOSPITAL_COMMUNITY): Payer: Self-pay | Admitting: *Deleted

## 2016-02-05 ENCOUNTER — Observation Stay (HOSPITAL_COMMUNITY)
Admission: RE | Admit: 2016-02-05 | Discharge: 2016-02-05 | Disposition: A | Discharge status: Home / Self Care | Payer: Medicare Other | Source: Ambulatory Visit | Attending: Neurological Surgery | Admitting: Neurological Surgery

## 2016-02-05 DIAGNOSIS — M4727 Other spondylosis with radiculopathy, lumbosacral region: Secondary | ICD-10-CM | POA: Diagnosis present

## 2016-02-05 DIAGNOSIS — Z881 Allergy status to other antibiotic agents status: Secondary | ICD-10-CM | POA: Insufficient documentation

## 2016-02-05 DIAGNOSIS — M4806 Spinal stenosis, lumbar region: Secondary | ICD-10-CM | POA: Insufficient documentation

## 2016-02-05 DIAGNOSIS — J45909 Unspecified asthma, uncomplicated: Secondary | ICD-10-CM

## 2016-02-05 DIAGNOSIS — J449 Chronic obstructive pulmonary disease, unspecified: Secondary | ICD-10-CM

## 2016-02-05 DIAGNOSIS — G9782 Other postprocedural complications and disorders of nervous system: Secondary | ICD-10-CM | POA: Diagnosis not present

## 2016-02-05 DIAGNOSIS — F17210 Nicotine dependence, cigarettes, uncomplicated: Secondary | ICD-10-CM | POA: Insufficient documentation

## 2016-02-05 DIAGNOSIS — Z419 Encounter for procedure for purposes other than remedying health state, unspecified: Secondary | ICD-10-CM

## 2016-02-05 DIAGNOSIS — I10 Essential (primary) hypertension: Secondary | ICD-10-CM

## 2016-02-05 DIAGNOSIS — D571 Sickle-cell disease without crisis: Secondary | ICD-10-CM

## 2016-02-05 HISTORY — DX: Pneumonia, unspecified organism: J18.9

## 2016-02-05 HISTORY — PX: LUMBAR LAMINECTOMY/DECOMPRESSION MICRODISCECTOMY: SHX5026

## 2016-02-05 LAB — CBC
HCT: 38.9 % — ABNORMAL LOW (ref 39.0–52.0)
Hemoglobin: 12.9 g/dL — ABNORMAL LOW (ref 13.0–17.0)
MCH: 29.5 pg (ref 26.0–34.0)
MCHC: 33.2 g/dL (ref 30.0–36.0)
MCV: 88.8 fL (ref 78.0–100.0)
PLATELETS: 259 10*3/uL (ref 150–400)
RBC: 4.38 MIL/uL (ref 4.22–5.81)
RDW: 12.3 % (ref 11.5–15.5)
WBC: 9.9 10*3/uL (ref 4.0–10.5)

## 2016-02-05 LAB — SURGICAL PCR SCREEN
MRSA, PCR: NEGATIVE
Staphylococcus aureus: POSITIVE — AB

## 2016-02-05 SURGERY — LUMBAR LAMINECTOMY/DECOMPRESSION MICRODISCECTOMY 2 LEVELS
Anesthesia: General

## 2016-02-05 MED ORDER — VANCOMYCIN HCL 1000 MG IV SOLR
INTRAVENOUS | Status: AC
  Filled 2016-02-05: qty 1000

## 2016-02-05 MED ORDER — FENTANYL CITRATE (PF) 250 MCG/5ML IJ SOLN
INTRAMUSCULAR | Status: AC
  Filled 2016-02-05: qty 5

## 2016-02-05 MED ORDER — PHENOL 1.4 % MT LIQD: 1.0000 | OROMUCOSAL | Status: DC | PRN

## 2016-02-05 MED ORDER — PRAMIPEXOLE DIHYDROCHLORIDE 1 MG PO TABS
2.0000 mg | ORAL_TABLET | Freq: Every day | ORAL | Status: DC
  Filled 2016-02-05: qty 2

## 2016-02-05 MED ORDER — MOMETASONE FURO-FORMOTEROL FUM 200-5 MCG/ACT IN AERO
2.0000 | INHALATION_SPRAY | Freq: Two times a day (BID) | RESPIRATORY_TRACT | Status: DC
  Filled 2016-02-05: qty 8.8

## 2016-02-05 MED ORDER — SODIUM CHLORIDE 0.9 % IR SOLN
Status: DC | PRN
  Administered 2016-02-05: 10:00:00

## 2016-02-05 MED ORDER — ALENDRONATE SODIUM 70 MG PO TABS: 70.0000 mg | ORAL_TABLET | ORAL | Status: DC

## 2016-02-05 MED ORDER — METOCLOPRAMIDE HCL 5 MG/ML IJ SOLN: 10.0000 mg | Freq: Once | INTRAMUSCULAR | Status: DC | PRN

## 2016-02-05 MED ORDER — BISACODYL 10 MG RE SUPP: 10.0000 mg | Freq: Every day | RECTAL | Status: DC | PRN

## 2016-02-05 MED ORDER — OXYCODONE HCL 5 MG PO TABS: 5.0000 mg | ORAL_TABLET | ORAL | Status: DC | PRN

## 2016-02-05 MED ORDER — ROCURONIUM BROMIDE 100 MG/10ML IV SOLN
INTRAVENOUS | Status: DC | PRN
  Administered 2016-02-05: 40 mg via INTRAVENOUS

## 2016-02-05 MED ORDER — BUPIVACAINE HCL (PF) 0.25 % IJ SOLN
INTRAMUSCULAR | Status: DC | PRN
  Administered 2016-02-05: 1 mL
  Administered 2016-02-05: 30 mL

## 2016-02-05 MED ORDER — PROPOFOL 10 MG/ML IV BOLUS
INTRAVENOUS | Status: DC | PRN
  Administered 2016-02-05: 160 mg via INTRAVENOUS

## 2016-02-05 MED ORDER — SUGAMMADEX SODIUM 200 MG/2ML IV SOLN
INTRAVENOUS | Status: DC | PRN
  Administered 2016-02-05: 200 mg via INTRAVENOUS

## 2016-02-05 MED ORDER — ONDANSETRON HCL 4 MG/2ML IJ SOLN: 4.0000 mg | INTRAMUSCULAR | Status: DC | PRN

## 2016-02-05 MED ORDER — ONDANSETRON HCL 4 MG/2ML IJ SOLN
INTRAMUSCULAR | Status: AC
  Filled 2016-02-05: qty 2

## 2016-02-05 MED ORDER — BUPIVACAINE LIPOSOME 1.3 % IJ SUSP
INTRAMUSCULAR | Status: DC | PRN
  Administered 2016-02-05: 20 mL

## 2016-02-05 MED ORDER — ONDANSETRON HCL 4 MG/2ML IJ SOLN
INTRAMUSCULAR | Status: DC | PRN
  Administered 2016-02-05: 4 mg via INTRAVENOUS

## 2016-02-05 MED ORDER — OXYCODONE-ACETAMINOPHEN 7.5-325 MG PO TABS: 1.0000 | ORAL_TABLET | ORAL | Status: DC | PRN

## 2016-02-05 MED ORDER — OXYCODONE HCL 5 MG PO TABS
20.0000 mg | ORAL_TABLET | Freq: Four times a day (QID) | ORAL | Status: DC | PRN
  Administered 2016-02-05 (×2): 20 mg via ORAL
  Filled 2016-02-05: qty 4

## 2016-02-05 MED ORDER — CELECOXIB 200 MG PO CAPS
200.0000 mg | ORAL_CAPSULE | Freq: Once | ORAL | Status: AC
  Administered 2016-02-05: 200 mg via ORAL
  Filled 2016-02-05: qty 1

## 2016-02-05 MED ORDER — HEMOSTATIC AGENTS (NO CHARGE) OPTIME
TOPICAL | Status: DC | PRN
  Administered 2016-02-05: 1 via TOPICAL

## 2016-02-05 MED ORDER — CELECOXIB 200 MG PO CAPS
200.0000 mg | ORAL_CAPSULE | Freq: Two times a day (BID) | ORAL | Status: DC
  Administered 2016-02-05: 200 mg via ORAL
  Filled 2016-02-05: qty 1

## 2016-02-05 MED ORDER — KCL IN DEXTROSE-NACL 20-5-0.45 MEQ/L-%-% IV SOLN: INTRAVENOUS | Status: DC

## 2016-02-05 MED ORDER — METHOCARBAMOL 750 MG PO TABS: 750.0000 mg | ORAL_TABLET | Freq: Four times a day (QID) | ORAL | Status: AC

## 2016-02-05 MED ORDER — MEPERIDINE HCL 25 MG/ML IJ SOLN: 6.2500 mg | INTRAMUSCULAR | Status: DC | PRN

## 2016-02-05 MED ORDER — METHOCARBAMOL 750 MG PO TABS
750.0000 mg | ORAL_TABLET | Freq: Four times a day (QID) | ORAL | Status: DC
  Administered 2016-02-05: 750 mg via ORAL
  Filled 2016-02-05: qty 1

## 2016-02-05 MED ORDER — CEFAZOLIN SODIUM 1-5 GM-% IV SOLN
1.0000 g | Freq: Three times a day (TID) | INTRAVENOUS | Status: DC
  Administered 2016-02-05: 1 g via INTRAVENOUS
  Filled 2016-02-05: qty 50

## 2016-02-05 MED ORDER — FENTANYL CITRATE (PF) 250 MCG/5ML IJ SOLN
INTRAMUSCULAR | Status: DC | PRN
  Administered 2016-02-05: 150 ug via INTRAVENOUS
  Administered 2016-02-05 (×4): 50 ug via INTRAVENOUS

## 2016-02-05 MED ORDER — CYCLOBENZAPRINE HCL 10 MG PO TABS
10.0000 mg | ORAL_TABLET | Freq: Every evening | ORAL | Status: DC | PRN
Start: 2016-02-05 — End: 2016-02-06

## 2016-02-05 MED ORDER — PROPOFOL 10 MG/ML IV BOLUS
INTRAVENOUS | Status: AC
  Filled 2016-02-05: qty 20

## 2016-02-05 MED ORDER — CEFAZOLIN SODIUM-DEXTROSE 2-4 GM/100ML-% IV SOLN
2.0000 g | INTRAVENOUS | Status: AC
  Administered 2016-02-05: 2 g via INTRAVENOUS

## 2016-02-05 MED ORDER — METHYLPREDNISOLONE ACETATE 80 MG/ML IJ SUSP
INTRAMUSCULAR | Status: DC | PRN
  Administered 2016-02-05: 80 mg

## 2016-02-05 MED ORDER — GABAPENTIN 300 MG PO CAPS
300.0000 mg | ORAL_CAPSULE | Freq: Three times a day (TID) | ORAL | Status: DC
  Administered 2016-02-05: 300 mg via ORAL
  Filled 2016-02-05: qty 1

## 2016-02-05 MED ORDER — HYDROMORPHONE HCL 1 MG/ML IJ SOLN
INTRAMUSCULAR | Status: AC
  Filled 2016-02-05: qty 1

## 2016-02-05 MED ORDER — KETOROLAC TROMETHAMINE 30 MG/ML IJ SOLN
INTRAMUSCULAR | Status: AC
  Filled 2016-02-05: qty 1

## 2016-02-05 MED ORDER — MIDAZOLAM HCL 5 MG/5ML IJ SOLN
INTRAMUSCULAR | Status: DC | PRN
  Administered 2016-02-05 (×2): 1 mg via INTRAVENOUS

## 2016-02-05 MED ORDER — VANCOMYCIN HCL 1000 MG IV SOLR
INTRAVENOUS | Status: DC | PRN
  Administered 2016-02-05: 1000 mg via TOPICAL

## 2016-02-05 MED ORDER — OXYCODONE HCL 5 MG PO TABS
ORAL_TABLET | ORAL | Status: AC
  Filled 2016-02-05: qty 1

## 2016-02-05 MED ORDER — POTASSIUM CHLORIDE IN NACL 20-0.9 MEQ/L-% IV SOLN
100.0000 mL/h | INTRAVENOUS | Status: DC
Start: 2016-02-05 — End: 2016-02-06
  Filled 2016-02-05 (×2): qty 1000

## 2016-02-05 MED ORDER — ALUM & MAG HYDROXIDE-SIMETH 200-200-20 MG/5ML PO SUSP: 30.0000 mL | Freq: Four times a day (QID) | ORAL | Status: DC | PRN

## 2016-02-05 MED ORDER — ALBUTEROL SULFATE HFA 108 (90 BASE) MCG/ACT IN AERS
INHALATION_SPRAY | RESPIRATORY_TRACT | Status: DC | PRN
  Administered 2016-02-05: 4 via RESPIRATORY_TRACT

## 2016-02-05 MED ORDER — ALBUTEROL SULFATE (2.5 MG/3ML) 0.083% IN NEBU: 3.0000 mL | INHALATION_SOLUTION | Freq: Four times a day (QID) | RESPIRATORY_TRACT | Status: DC | PRN

## 2016-02-05 MED ORDER — OXYCODONE HCL 5 MG PO TABS
ORAL_TABLET | ORAL | Status: AC
  Filled 2016-02-05: qty 4

## 2016-02-05 MED ORDER — ROCURONIUM BROMIDE 50 MG/5ML IV SOLN
INTRAVENOUS | Status: AC
  Filled 2016-02-05: qty 1

## 2016-02-05 MED ORDER — ACETAMINOPHEN 10 MG/ML IV SOLN
INTRAVENOUS | Status: AC
  Administered 2016-02-05: 1000 mg via INTRAVENOUS
  Filled 2016-02-05: qty 100

## 2016-02-05 MED ORDER — LIDOCAINE-EPINEPHRINE 2 %-1:100000 IJ SOLN
INTRAMUSCULAR | Status: DC | PRN
  Administered 2016-02-05: 30 mL via INTRADERMAL

## 2016-02-05 MED ORDER — LACTATED RINGERS IV SOLN
INTRAVENOUS | Status: DC | PRN
  Administered 2016-02-05 (×2): via INTRAVENOUS

## 2016-02-05 MED ORDER — 0.9 % SODIUM CHLORIDE (POUR BTL) OPTIME
TOPICAL | Status: DC | PRN
  Administered 2016-02-05: 1000 mL

## 2016-02-05 MED ORDER — ACETAMINOPHEN 500 MG PO TABS
1000.0000 mg | ORAL_TABLET | Freq: Four times a day (QID) | ORAL | Status: DC
  Administered 2016-02-05: 1000 mg via ORAL
  Filled 2016-02-05: qty 2

## 2016-02-05 MED ORDER — MENTHOL 3 MG MT LOZG: 1.0000 | LOZENGE | OROMUCOSAL | Status: DC | PRN

## 2016-02-05 MED ORDER — OXYCODONE HCL ER 10 MG PO T12A
20.0000 mg | EXTENDED_RELEASE_TABLET | Freq: Two times a day (BID) | ORAL | Status: DC
  Administered 2016-02-05: 20 mg via ORAL
  Filled 2016-02-05: qty 2

## 2016-02-05 MED ORDER — SODIUM CHLORIDE 0.9% FLUSH: 3.0000 mL | INTRAVENOUS | Status: DC | PRN

## 2016-02-05 MED ORDER — BUPIVACAINE LIPOSOME 1.3 % IJ SUSP
20.0000 mL | INTRAMUSCULAR | Status: DC
  Filled 2016-02-05: qty 20

## 2016-02-05 MED ORDER — THROMBIN 5000 UNITS EX SOLR
CUTANEOUS | Status: DC | PRN
  Administered 2016-02-05 (×2): 5000 [IU] via TOPICAL

## 2016-02-05 MED ORDER — LIDOCAINE 2% (20 MG/ML) 5 ML SYRINGE
INTRAMUSCULAR | Status: DC | PRN
  Administered 2016-02-05: 60 mg via INTRAVENOUS

## 2016-02-05 MED ORDER — DEXAMETHASONE SODIUM PHOSPHATE 10 MG/ML IJ SOLN
INTRAMUSCULAR | Status: AC
  Filled 2016-02-05: qty 1

## 2016-02-05 MED ORDER — FLEET ENEMA 7-19 GM/118ML RE ENEM
1.0000 | ENEMA | Freq: Once | RECTAL | Status: DC | PRN
Start: 2016-02-05 — End: 2016-02-06

## 2016-02-05 MED ORDER — HYDROMORPHONE HCL 1 MG/ML IJ SOLN
0.2500 mg | INTRAMUSCULAR | Status: DC | PRN
  Administered 2016-02-05 (×2): 0.5 mg via INTRAVENOUS

## 2016-02-05 MED ORDER — LIDOCAINE 2% (20 MG/ML) 5 ML SYRINGE
INTRAMUSCULAR | Status: AC
  Filled 2016-02-05: qty 5

## 2016-02-05 MED ORDER — ALBUTEROL SULFATE HFA 108 (90 BASE) MCG/ACT IN AERS
INHALATION_SPRAY | RESPIRATORY_TRACT | Status: AC
  Filled 2016-02-05: qty 6.7

## 2016-02-05 MED ORDER — MUPIROCIN 2 % EX OINT
TOPICAL_OINTMENT | Freq: Once | CUTANEOUS | Status: AC
  Administered 2016-02-05: 1 via NASAL
  Filled 2016-02-05: qty 22

## 2016-02-05 MED ORDER — DOCUSATE SODIUM 100 MG PO CAPS
100.0000 mg | ORAL_CAPSULE | Freq: Two times a day (BID) | ORAL | Status: DC
  Administered 2016-02-05: 100 mg via ORAL
  Filled 2016-02-05: qty 1

## 2016-02-05 MED ORDER — MUPIROCIN 2 % EX OINT
TOPICAL_OINTMENT | CUTANEOUS | Status: AC
  Filled 2016-02-05: qty 22

## 2016-02-05 MED ORDER — DEXAMETHASONE SODIUM PHOSPHATE 10 MG/ML IJ SOLN
INTRAMUSCULAR | Status: DC | PRN
Start: 2016-02-05 — End: 2016-02-05
  Administered 2016-02-05: 10 mg via INTRAVENOUS

## 2016-02-05 MED ORDER — CEFAZOLIN SODIUM-DEXTROSE 2-4 GM/100ML-% IV SOLN
INTRAVENOUS | Status: AC
  Filled 2016-02-05: qty 100

## 2016-02-05 MED ORDER — KETOROLAC TROMETHAMINE 30 MG/ML IJ SOLN
INTRAMUSCULAR | Status: DC | PRN
  Administered 2016-02-05: 30 mg via INTRAMUSCULAR

## 2016-02-05 MED ORDER — BUPROPION HCL ER (XL) 150 MG PO TB24
150.0000 mg | ORAL_TABLET | Freq: Every day | ORAL | Status: DC
  Filled 2016-02-05: qty 1

## 2016-02-05 MED ORDER — TIOTROPIUM BROMIDE MONOHYDRATE 18 MCG IN CAPS
18.0000 ug | ORAL_CAPSULE | Freq: Every day | RESPIRATORY_TRACT | Status: DC
Start: 2016-02-06 — End: 2016-02-06
  Filled 2016-02-05: qty 5

## 2016-02-05 MED ORDER — MIDAZOLAM HCL 2 MG/2ML IJ SOLN
INTRAMUSCULAR | Status: AC
  Filled 2016-02-05: qty 2

## 2016-02-05 MED ORDER — MELOXICAM 7.5 MG PO TABS: 7.5000 mg | ORAL_TABLET | Freq: Two times a day (BID) | ORAL | Status: DC

## 2016-02-05 MED ORDER — SENNA 8.6 MG PO TABS: 1.0000 | ORAL_TABLET | Freq: Two times a day (BID) | ORAL | Status: DC

## 2016-02-05 MED ORDER — GABAPENTIN 300 MG PO CAPS
300.0000 mg | ORAL_CAPSULE | Freq: Three times a day (TID) | ORAL | Status: AC
Start: 2016-02-05 — End: ?

## 2016-02-05 MED ORDER — GELATIN ABSORBABLE MT POWD
OROMUCOSAL | Status: DC | PRN
  Administered 2016-02-05: 10:00:00 via TOPICAL

## 2016-02-05 MED ORDER — ROFLUMILAST 500 MCG PO TABS
500.0000 ug | ORAL_TABLET | Freq: Every day | ORAL | Status: DC
  Administered 2016-02-05: 500 ug via ORAL
  Filled 2016-02-05: qty 1

## 2016-02-05 MED ORDER — SODIUM CHLORIDE 0.9% FLUSH: 3.0000 mL | Freq: Two times a day (BID) | INTRAVENOUS | Status: DC

## 2016-02-05 SURGICAL SUPPLY — 71 items
BAG DECANTER FOR FLEXI CONT (MISCELLANEOUS) ×3 IMPLANT
BENZOIN TINCTURE PRP APPL 2/3 (GAUZE/BANDAGES/DRESSINGS) IMPLANT
BIT DRILL NEURO 2X3.1 SFT TUCH (MISCELLANEOUS) ×1 IMPLANT
BLADE CLIPPER SURG (BLADE) IMPLANT
BLADE SURG 11 STRL SS (BLADE) ×3 IMPLANT
BUR MATCHSTICK NEURO 3.0 LAGG (BURR) ×3 IMPLANT
BUR ROUND FLUTED 5 RND (BURR) ×2 IMPLANT
BUR ROUND FLUTED 5MM RND (BURR) ×1
CANISTER SUCT 3000ML PPV (MISCELLANEOUS) ×6 IMPLANT
CHLORAPREP W/TINT 26ML (MISCELLANEOUS) ×3 IMPLANT
CLOSURE WOUND 1/2 X4 (GAUZE/BANDAGES/DRESSINGS)
CONT SPEC 4OZ CLIKSEAL STRL BL (MISCELLANEOUS) ×3 IMPLANT
DECANTER SPIKE VIAL GLASS SM (MISCELLANEOUS) ×3 IMPLANT
DERMABOND ADVANCED (GAUZE/BANDAGES/DRESSINGS) ×2
DERMABOND ADVANCED .7 DNX12 (GAUZE/BANDAGES/DRESSINGS) ×1 IMPLANT
DRAPE MICROSCOPE LEICA (MISCELLANEOUS) ×3 IMPLANT
DRAPE POUCH INSTRU U-SHP 10X18 (DRAPES) ×3 IMPLANT
DRAPE SURG 17X23 STRL (DRAPES) ×3 IMPLANT
DRILL NEURO 2X3.1 SOFT TOUCH (MISCELLANEOUS) ×3
DRSG OPSITE POSTOP 4X6 (GAUZE/BANDAGES/DRESSINGS) ×3 IMPLANT
ELECT REM PT RETURN 9FT ADLT (ELECTROSURGICAL) ×3
ELECTRODE REM PT RTRN 9FT ADLT (ELECTROSURGICAL) ×1 IMPLANT
GAUZE SPONGE 4X4 12PLY STRL (GAUZE/BANDAGES/DRESSINGS) IMPLANT
GAUZE SPONGE 4X4 16PLY XRAY LF (GAUZE/BANDAGES/DRESSINGS) IMPLANT
GLOVE BIO SURGEON STRL SZ8 (GLOVE) ×3 IMPLANT
GLOVE BIOGEL PI IND STRL 7.5 (GLOVE) ×1 IMPLANT
GLOVE BIOGEL PI IND STRL 8.5 (GLOVE) ×1 IMPLANT
GLOVE BIOGEL PI INDICATOR 7.5 (GLOVE) ×2
GLOVE BIOGEL PI INDICATOR 8.5 (GLOVE) ×2
GLOVE EXAM NITRILE LRG STRL (GLOVE) IMPLANT
GLOVE EXAM NITRILE MD LF STRL (GLOVE) IMPLANT
GLOVE EXAM NITRILE XL STR (GLOVE) IMPLANT
GLOVE EXAM NITRILE XS STR PU (GLOVE) IMPLANT
GLOVE SS BIOGEL STRL SZ 7 (GLOVE) ×2 IMPLANT
GLOVE SUPERSENSE BIOGEL SZ 7 (GLOVE) ×4
GOWN STRL REUS W/ TWL LRG LVL3 (GOWN DISPOSABLE) ×1 IMPLANT
GOWN STRL REUS W/ TWL XL LVL3 (GOWN DISPOSABLE) IMPLANT
GOWN STRL REUS W/TWL LRG LVL3 (GOWN DISPOSABLE) ×2
GOWN STRL REUS W/TWL XL LVL3 (GOWN DISPOSABLE)
HEMOSTAT POWDER KIT SURGIFOAM (HEMOSTASIS) ×3 IMPLANT
KIT BASIN OR (CUSTOM PROCEDURE TRAY) ×3 IMPLANT
KIT ROOM TURNOVER OR (KITS) ×3 IMPLANT
NEEDLE HYPO 18GX1.5 BLUNT FILL (NEEDLE) ×3 IMPLANT
NEEDLE HYPO 21X1.5 SAFETY (NEEDLE) ×6 IMPLANT
NEEDLE HYPO 25X1 1.5 SAFETY (NEEDLE) ×3 IMPLANT
NS IRRIG 1000ML POUR BTL (IV SOLUTION) ×3 IMPLANT
PACK LAMINECTOMY NEURO (CUSTOM PROCEDURE TRAY) ×3 IMPLANT
PACK UNIVERSAL I (CUSTOM PROCEDURE TRAY) ×3 IMPLANT
PAD ARMBOARD 7.5X6 YLW CONV (MISCELLANEOUS) ×9 IMPLANT
PATTIES SURGICAL .5X1.5 (GAUZE/BANDAGES/DRESSINGS) ×3 IMPLANT
RUBBERBAND STERILE (MISCELLANEOUS) ×6 IMPLANT
SPONGE SURGIFOAM ABS GEL SZ50 (HEMOSTASIS) ×3 IMPLANT
STRIP CLOSURE SKIN 1/2X4 (GAUZE/BANDAGES/DRESSINGS) IMPLANT
SUT STRATAFIX 1PDS 45CM VIOLET (SUTURE) ×3 IMPLANT
SUT STRATAFIX MNCRL+ 3-0 PS-2 (SUTURE) ×1
SUT STRATAFIX MONOCRYL 3-0 (SUTURE) ×2
SUT STRATAFIX SPIRAL + 2-0 (SUTURE) ×6 IMPLANT
SUT VIC AB 0 CT1 18XCR BRD8 (SUTURE) IMPLANT
SUT VIC AB 0 CT1 8-18 (SUTURE)
SUT VIC AB 2-0 CT1 18 (SUTURE) IMPLANT
SUT VIC AB 4-0 PS2 27 (SUTURE) IMPLANT
SUTURE STRATFX MNCRL+ 3-0 PS-2 (SUTURE) ×1 IMPLANT
SYR 20CC LL (SYRINGE) ×3 IMPLANT
SYR 30ML LL (SYRINGE) ×3 IMPLANT
SYR 3ML LL SCALE MARK (SYRINGE) ×3 IMPLANT
TOWEL OR 17X24 6PK STRL BLUE (TOWEL DISPOSABLE) ×3 IMPLANT
TOWEL OR 17X26 10 PK STRL BLUE (TOWEL DISPOSABLE) ×3 IMPLANT
TRAY FOLEY W/METER SILVER 16FR (SET/KITS/TRAYS/PACK) ×3 IMPLANT
TUBE CONNECTING 12'X1/4 (SUCTIONS) ×1
TUBE CONNECTING 12X1/4 (SUCTIONS) ×2 IMPLANT
WATER STERILE IRR 1000ML POUR (IV SOLUTION) ×3 IMPLANT

## 2016-02-05 NOTE — Progress Notes (Signed)
Pt and family given D/C instructions with Rx's, verbal understanding was provided. Pt's incision is open to air and is clean and dry with no sign of infection. Pt's IV was removed prior to D/C. Marland Kitchen Pt is stable and voiding adequately with mild pain. Patient has appointment with physician in 2 weeks.

## 2016-02-05 NOTE — Anesthesia Postprocedure Evaluation (Signed)
Anesthesia Post Note  Patient: Jason Ballard  Procedure(s) Performed: Procedure(s) (LRB): Lumbar four- five Lumbar five-Sacral one Laminectomy with Left Side foraminotomy (N/A)  Patient location during evaluation: PACU Anesthesia Type: General Level of consciousness: awake and alert and oriented Pain management: pain level controlled Vital Signs Assessment: post-procedure vital signs reviewed and stable Respiratory status: spontaneous breathing, nonlabored ventilation and respiratory function stable Cardiovascular status: blood pressure returned to baseline and stable Postop Assessment: no signs of nausea or vomiting Anesthetic complications: no    Last Vitals:  Filed Vitals:   02/05/16 1100 02/05/16 1115  BP:  132/78  Pulse: 68 77  Temp:    Resp: 17 14    Last Pain:  Filed Vitals:   02/05/16 1130  PainSc: 3                  Trystyn Sitts A.

## 2016-02-05 NOTE — Progress Notes (Signed)
No post-op events Moving legs well Stable TTF when recovered

## 2016-02-05 NOTE — Progress Notes (Signed)
Occupational Therapy Evaluation/Discharge Patient Details Name: Jason Ballard MRN: [id number] DOB: 1965-10-30 Today's Date: 02/05/2016    History of Present Illness Pt is a 50 y/o F s/p L4-5 , L5-S1 laminectomy w/ Lt side foraminotomy.  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   Clinical Impression   Patient has been evaluated by Occupational Therapy with no acute OT needs identified. Pt completed all ADLs and transfers at mod I level. Educated pt on back precautions, compensatory strategies for ADLs and IADLs and fall prevention strategies. All education has been completed and pt has no further acute OT needs. No OT follow up or DME recommendations. OT signing off. Thank you for this referral.    Follow Up Recommendations  No OT follow up;Supervision - Intermittent    Equipment Recommendations  None recommended by OT    Recommendations for Other Services       Precautions / Restrictions Precautions Precautions: Back Precaution Booklet Issued: No Precaution Comments: Pt able to recall all back precautions at beginning of session Restrictions Weight Bearing Restrictions: No      Mobility Bed Mobility Overal bed mobility: Modified Independent             General bed mobility comments: Pt sitting EOB on OT arrival. Pt verbalized understanding of log roll technique.  Transfers Overall transfer level: Modified independent Equipment used: None             General transfer comment: No physical assist or cues for safety required.    Balance Overall balance assessment: No apparent balance deficits (not formally assessed) Sitting-balance support: No upper extremity supported;Feet supported Sitting balance-Leahy Scale: Good     Standing balance support: No upper extremity supported;During functional activity Standing balance-Leahy Scale: Good               High level balance activites: Turns;Sudden stops;Head turns;Other  (comment);Backward walking (stepping over object) High Level Balance Comments: No instability w/ high level activities            ADL Overall ADL's : Modified independent                                       General ADL Comments: Educated pt on back precautions, positioning for sleep, compensatory strategies for ADLs, gradual activity progression, and fall prevention strategies.      Vision Vision Assessment?: No apparent visual deficits   Perception     Praxis      Pertinent Vitals/Pain Pain Assessment: No/denies pain     Hand Dominance Right   Extremity/Trunk Assessment Upper Extremity Assessment Upper Extremity Assessment: Overall WFL for tasks assessed   Lower Extremity Assessment Lower Extremity Assessment: Overall WFL for tasks assessed;Defer to PT evaluation LLE Deficits / Details: Knee extension and flexion 4+/5 LLE Sensation:  (reports of tingling anterior thigh)   Cervical / Trunk Assessment Cervical / Trunk Assessment: Other exceptions Cervical / Trunk Exceptions: s/p laminectomy   Communication Communication Communication: No difficulties   Cognition Arousal/Alertness: Awake/alert Behavior During Therapy: WFL for tasks assessed/performed Overall Cognitive Status: Within Functional Limits for tasks assessed                     General Comments       Exercises       Shoulder Instructions      Home Living Family/patient expects to be discharged to:: Private residence  Living Arrangements: Spouse/significant other Available Help at Discharge: Family;Available 24 hours/day Type of Home: Mobile home Home Access: Stairs to enter Entrance Stairs-Number of Steps: 5 Entrance Stairs-Rails: Left Home Layout: One level     Bathroom Shower/Tub: Tub/shower unit Shower/tub characteristics: Architectural technologist: Standard     Home Equipment: Radio producer - single point          Prior Functioning/Environment Level of  Independence: Independent             OT Diagnosis:     OT Problem List: Decreased activity tolerance;Decreased safety awareness;Decreased knowledge of use of DME or AE   OT Treatment/Interventions:      OT Goals(Current goals can be found in the care plan section) Acute Rehab OT Goals Patient Stated Goal: to go home today OT Goal Formulation: With patient Time For Goal Achievement: 02/19/16 Potential to Achieve Goals: Good  OT Frequency:     Barriers to D/C:            Co-evaluation              End of Session Nurse Communication: Mobility status  Activity Tolerance: Patient tolerated treatment well Patient left: in bed;with call bell/phone within reach   Time: [id number] OT Time Calculation (min): 9 min Charges:  OT General Charges $OT Visit: 1 Procedure OT Evaluation $OT Eval Low Complexity: 1 Procedure G-Codes: OT G-codes **NOT FOR INPATIENT CLASS** Functional Assessment Tool Used: clinical judgement Functional Limitation: Self care Self Care Current Status [id number]): 0 percent impaired, limited or restricted Self Care Goal Status [id number]): 0 percent impaired, limited or restricted Self Care Discharge Status [id number]): 0 percent impaired, limited or restricted  Redmond Baseman, OTR/L Pager: [phone] 02/05/2016, 4:45 PM

## 2016-02-05 NOTE — Anesthesia Procedure Notes (Signed)
Procedure Name: Intubation Date/Time: 02/05/2016 9:13 AM Performed by: Layla Maw Pre-anesthesia Checklist: Patient identified, Patient being monitored, Timeout performed, Emergency Drugs available and Suction available Patient Re-evaluated:Patient Re-evaluated prior to inductionOxygen Delivery Method: Circle System Utilized Preoxygenation: Pre-oxygenation with 100% oxygen Intubation Type: IV induction Ventilation: Mask ventilation without difficulty Laryngoscope Size: Miller and 3 Grade View: Grade I Tube type: Oral Tube size: 8.0 mm Number of attempts: 1 Airway Equipment and Method: Stylet Placement Confirmation: ETT inserted through vocal cords under direct vision,  positive ETCO2 and breath sounds checked- equal and bilateral Secured at: 22 cm Tube secured with: Tape Dental Injury: Teeth and Oropharynx as per pre-operative assessment

## 2016-02-05 NOTE — Discharge Summary (Signed)
Date of Admission: 02/05/2016  Date of Discharge: 02/05/2016  Admission Diagnosis: Lumbosacral spondylosis with radiculopathy  Discharge Diagnosis: Same  Procedure Performed: L4-S1 laminectomy for decompression with left sided foraminotomies  Attending: Tamala Fothergill, MD  Hospital Course:  The patient was admitted for the above listed operation and had an uncomplicated post-operative course.  They were discharged in stable condition.  Follow up: 3 weeks    Medication List    TAKE these medications        Aclidinium Bromide 400 MCG/ACT Aepb  Inhale 2 puffs into the lungs 2 (two) times daily. TUDORZA     albuterol 108 (90 Base) MCG/ACT inhaler  Commonly known as:  PROVENTIL HFA;VENTOLIN HFA  Inhale 2 puffs into the lungs every 6 (six) hours as needed for wheezing.     alendronate 70 MG tablet  Commonly known as:  FOSAMAX  Take 70 mg by mouth every Friday.     buPROPion 150 MG 24 hr tablet  Commonly known as:  WELLBUTRIN XL  Take 150 mg by mouth at bedtime.     cyclobenzaprine 10 MG tablet  Commonly known as:  FLEXERIL  Take 10 mg by mouth at bedtime as needed for muscle spasms.     DALIRESP 500 MCG Tabs tablet  Generic drug:  roflumilast  Take 500 mcg by mouth daily.     gabapentin 300 MG capsule  Commonly known as:  NEURONTIN  Take 1 capsule (300 mg total) by mouth 3 (three) times daily.     meloxicam 7.5 MG tablet  Commonly known as:  MOBIC  Take 1 tablet (7.5 mg total) by mouth 2 (two) times daily.     methocarbamol 750 MG tablet  Commonly known as:  ROBAXIN  Take 1 tablet (750 mg total) by mouth 4 (four) times daily.     Oxycodone HCl 20 MG Tabs  Take 20 mg by mouth every 6 (six) hours as needed (For pain.).     oxyCODONE-acetaminophen 7.5-325 MG tablet  Commonly known as:  PERCOCET  Take 1-2 tablets by mouth every 4 (four) hours as needed for severe pain.     pramipexole 1 MG tablet  Commonly known as:  MIRAPEX  Take 2 mg by mouth at bedtime.     SYMBICORT 160-4.5 MCG/ACT inhaler  Generic drug:  budesonide-formoterol  Inhale 2 puffs into the lungs 2 (two) times daily.

## 2016-02-05 NOTE — Transfer of Care (Signed)
Immediate Anesthesia Transfer of Care Note  Patient: Jason Ballard  Procedure(s) Performed: Procedure(s) with comments: Lumbar four- five Lumbar five-Sacral one Laminectomy with Left Side foraminotomy (N/A) - L4-5 L5-S1 Laminectomy with Left Side foraminotomy  Patient Location: PACU  Anesthesia Type:General  Level of Consciousness: awake, alert , oriented and patient cooperative  Airway & Oxygen Therapy: Patient Spontanous Breathing and Patient connected to nasal cannula oxygen  Post-op Assessment: Report given to RN, Post -op Vital signs reviewed and stable and Patient moving all extremities X 4  Post vital signs: Reviewed and stable  Last Vitals:  Filed Vitals:   02/05/16 0613  BP: 106/59  Pulse: 58  Temp: 37 C  Resp: 20    Last Pain:  Filed Vitals:   02/05/16 1100  PainSc: 8       Patients Stated Pain Goal: 9 ([id number])  Complications: No apparent anesthesia complications

## 2016-02-05 NOTE — Op Note (Signed)
02/05/2016  10:48 AM  PATIENT:  Jason Ballard  50 y.o. 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 Patient understood the risks, benefits, and alternatives and potential outcomes and wished to proceed.  PROCEDURE DETAILS: After smooth induction of general endotracheal anesthesia the patient was turned prone on the operating table on a Wilson frame. The skin of the lumbar region was clipped of hair. It was wiped down with alcohol. The patient was then prepped and draped in usual sterile fashion.   The subcutaneous tissues of the midline from approximately L4 to S1 was infiltrated with Marcaine. The skin in this area was opened sharply. The soft tissues were dissected with monopolar cautery. Subperiosteal dissection was carried out along the sides of the spinous processes and the laminar surfaces to the medial edge of the facet joints from L4 to S1. The spinous processes were removed with rongeurs. The laminae were then thinned with a high-speed bur. The remaining lamina was resected with a Kerrison punch. Thickened ligamentum flavum was separated from the dura and resected with a Kerrison rongeur. The thecal sac was further freed from the hypertrophied ligament in the lateral recesses.  Lateral recess decompression was completed. Decompression was carried out laterally to the foramina. The foramina on the left were found to be markedly stenotic.  I used a 3 mm Kerrison punch to open these as widely and laterally as possible wile  protecting the exiting nerve roots.  The foramina were palpated and found to be without residual stenosis.   I irrigated vigorously with bacitracin saline. There was excellent hemostasis. I placed vancomycin powder in the depths of the wounds.The wound was then closed in routine anatomic layers with running stratafix suture. I injected exparel in the paraspinous muscles. The skin was closed with a running Monocryl strata fix suture. It was then sealed with Dermabond. The patient was turned to the supine position and allowed to awaken from anesthesia.   PATIENT DISPOSITION:  PACU - hemodynamically stable.   Delay start of Pharmacological VTE agent (>24hrs) due to surgical blood loss or risk of bleeding:  yes

## 2016-02-05 NOTE — Progress Notes (Signed)
CC:  No chief complaint on file.   HPI: Jason Ballard is a 50 y.o. male with long standing left leg radicular pain.  He presents for lumbar decompression.  PMH: Past Medical History  Diagnosis Date  . COPD (chronic obstructive pulmonary disease) ([place])   . Emphysema   . Pneumonia   . Asthma     PSH: Past Surgical History  Procedure Laterality Date  . Back surgery  2008    neck surgery  . Nerve replacement in right elbow, 2008    . Appendectomy    . Anterior cervical decomp/discectomy fusion N/A 05/03/2013    Procedure: ANTERIOR CERVICAL DECOMPRESSION/DISCECTOMY FUSION Cervical Three-Four;  Surgeon: Faythe Ghee, MD;  Location: [facility];  Service: Neurosurgery;  Laterality: N/A;  . Video assisted thoracoscopy (vats)/empyema Right 12/19/2014    Procedure: VIDEO ASSISTED THORACOSCOPY/right thoracotomy (VATS)/DRAIN EMPYEMA;  Surgeon: Ivin Poot, MD;  Location: [facility];  Service: Thoracic;  Laterality: Right;    SH: Social History  Substance Use Topics  . Smoking status: Current Every Day Smoker -- 0.25 packs/day for 33 years    Types: Cigarettes  . Smokeless tobacco: Never Used  . Alcohol Use: Yes     Comment: rare    MEDS: Prior to Admission medications   Medication Sig Start Date End Date Taking? Authorizing Provider  Aclidinium Bromide 400 MCG/ACT AEPB Inhale 2 puffs into the lungs 2 (two) times daily. [place]   Yes Historical Provider, MD  albuterol (PROVENTIL HFA;VENTOLIN HFA) 108 (90 BASE) MCG/ACT inhaler Inhale 2 puffs into the lungs every 6 (six) hours as needed for wheezing.   Yes Historical Provider, MD  alendronate (FOSAMAX) 70 MG tablet Take 70 mg by mouth every Friday.  09/13/15  Yes Historical Provider, MD  buPROPion (WELLBUTRIN XL) 150 MG 24 hr tablet Take 150 mg by mouth at bedtime.  08/21/15  Yes Historical Provider, MD  cyclobenzaprine (FLEXERIL) 10 MG tablet Take 10 mg by mouth at bedtime as needed for muscle spasms.  10/06/15  Yes Historical Provider,  MD  Oxycodone HCl 20 MG TABS Take 20 mg by mouth every 6 (six) hours as needed (For pain.).  09/13/15  Yes Historical Provider, MD  pramipexole (MIRAPEX) 1 MG tablet Take 2 mg by mouth at bedtime.   Yes Historical Provider, MD  roflumilast (DALIRESP) 500 MCG TABS tablet Take 500 mcg by mouth daily.   Yes Historical Provider, MD  SYMBICORT 160-4.5 MCG/ACT inhaler Inhale 2 puffs into the lungs 2 (two) times daily. 01/27/16  Yes Historical Provider, MD    ALLERGY: Allergies  Allergen Reactions  . Levaquin [Levofloxacin In D5w] Nausea And Vomiting and Other (See Comments)    Upset stomach     ROS: Review of Systems  Constitutional: Negative.   HENT: Negative.   Eyes: Negative.   Respiratory: Negative.   Cardiovascular: Negative.   Gastrointestinal: Negative.   Genitourinary: Negative.   Musculoskeletal: Negative.   Skin: Negative.   Neurological: Negative.   Endo/Heme/Allergies: Negative.   Psychiatric/Behavioral: Negative.   Left leg pain to top of foot  NEUROLOGIC EXAM: Awake, alert, oriented Memory and concentration grossly intact Speech fluent, appropriate CN grossly intact  Motor exam: Upper Extremities Deltoid Bicep Tricep Grip  Right 5/5 5/5 5/5 5/5  Left 5/5 5/5 5/5 5/5   Lower Extremity IP Quad PF DF EHL  Right 5/5 5/5 5/5 5/5 5/5  Left 5/5 5/5 5/5 5/5 5/5   Sensation grossly intact to LT  IMAGING: No new imaging  IMPRESSION: - 50 y.o. 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  We have had a long discussion about the risks and benefits of surgery and he wishes to proceed.  PLAN: - L4-5 and L5-S1 laminectomy with left sided foraminotomies.

## 2016-02-05 NOTE — Evaluation (Signed)
Physical Therapy Evaluation and Discharge Patient Details Name: Jason Ballard MRN: [id number] DOB: 12-Jun-1966 Today's Date: 02/05/2016   History of Present Illness  Pt is a 50 y/o F s/p L4-5 , L5-S1 laminectomy w/ Lt side foraminotomy.  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    Clinical Impression  Patient is s/p above surgery.  He presents at mod I level of functioning.  Gait and stair training complete.  All education completed including back precautions and safe body mechanics w/ transfers and bed mobility.  No skilled PT needs identified, PT will sign off.   Follow Up Recommendations No PT follow up    Equipment Recommendations  None recommended by PT    Recommendations for Other Services       Precautions / Restrictions Precautions Precautions: Back Precaution Booklet Issued: Yes (comment) Precaution Comments: Provided handout and reviewed back precautions w/ pt Restrictions Weight Bearing Restrictions: No      Mobility  Bed Mobility Overal bed mobility: Modified Independent             General bed mobility comments: Cues for log roll technique for supine<>sit and pt repeats w/o cues.  Transfers Overall transfer level: Modified independent Equipment used: None             General transfer comment: No cues or physical assist needed  Ambulation/Gait Ambulation/Gait assistance: Modified independent (Device/Increase time) Ambulation Distance (Feet): 350 Feet Assistive device: None Gait Pattern/deviations: Step-through pattern;Decreased stride length   Gait velocity interpretation: at or above normal speed for age/gender General Gait Details: No cues or physical assist needed.  Tolerated high level balance activities as documented below.  Stairs Stairs: Yes Stairs assistance: Modified independent (Device/Increase time) Stair Management: One rail Left;Alternating pattern;Forwards Number of Stairs: 5 General stair comments: Pt  uses rail to steady.  No cues or physical assist needed.  Wheelchair Mobility    Modified Rankin (Stroke Patients Only)       Balance Overall balance assessment: Modified Independent Sitting-balance support: No upper extremity supported;Feet supported Sitting balance-Leahy Scale: Good     Standing balance support: No upper extremity supported;During functional activity Standing balance-Leahy Scale: Good               High level balance activites: Turns;Sudden stops;Head turns;Other (comment);Backward walking (stepping over object) High Level Balance Comments: No instability w/ high level activities             Pertinent Vitals/Pain Pain Assessment: No/denies pain    Home Living Family/patient expects to be discharged to:: Private residence Living Arrangements: Spouse/significant other Available Help at Discharge: Family;Available 24 hours/day Type of Home: Mobile home Home Access: Stairs to enter Entrance Stairs-Rails: Left Entrance Stairs-Number of Steps: 5 Home Layout: One level Home Equipment: Cane - single point      Prior Function Level of Independence: Independent               Hand Dominance        Extremity/Trunk Assessment   Upper Extremity Assessment: Overall WFL for tasks assessed           Lower Extremity Assessment: LLE deficits/detail   LLE Deficits / Details: Knee extension and flexion 4+/5  Cervical / Trunk Assessment: Other exceptions  Communication   Communication: No difficulties  Cognition Arousal/Alertness: Awake/alert Behavior During Therapy: WFL for tasks assessed/performed Overall Cognitive Status: Within Functional Limits for tasks assessed  General Comments      Exercises        Assessment/Plan    PT Assessment Patent does not need any further PT services  PT Diagnosis Acute pain   PT Problem List    PT Treatment Interventions     PT Goals (Current goals can be found  in the Care Plan section) Acute Rehab PT Goals Patient Stated Goal: to go home today PT Goal Formulation: All assessment and education complete, DC therapy    Frequency     Barriers to discharge        Co-evaluation               End of Session   Activity Tolerance: Patient tolerated treatment well Patient left: in bed;Other (comment);with call bell/phone within reach;with family/visitor present (sitting EOB) Nurse Communication: Mobility status;Precautions    Functional Assessment Tool Used: Clinical Judgement Functional Limitation: Mobility: Walking and moving around Mobility: Walking and Moving Around Current Status [id number]): At least 1 percent but less than 20 percent impaired, limited or restricted Mobility: Walking and Moving Around Goal Status [phone]): At least 1 percent but less than 20 percent impaired, limited or restricted Mobility: Walking and Moving Around Discharge Status [phone]): At least 1 percent but less than 20 percent impaired, limited or restricted    Time: 1459-1510 PT Time Calculation (min) (ACUTE ONLY): 11 min   Charges:   PT Evaluation $PT Eval Low Complexity: 1 Procedure     PT G Codes:   PT G-Codes **NOT FOR INPATIENT CLASS** Functional Assessment Tool Used: Clinical Judgement Functional Limitation: Mobility: Walking and moving around Mobility: Walking and Moving Around Current Status [id number]): At least 1 percent but less than 20 percent impaired, limited or restricted Mobility: Walking and Moving Around Goal Status [phone]): At least 1 percent but less than 20 percent impaired, limited or restricted Mobility: Walking and Moving Around Discharge Status [phone]): At least 1 percent but less than 20 percent impaired, limited or restricted    Collie Siad PT, DPT  Pager: [phone] Phone: [phone] 02/05/2016, 3:35 PM

## 2016-02-06 ENCOUNTER — Encounter (HOSPITAL_COMMUNITY): Payer: Self-pay | Admitting: Neurological Surgery

## 2016-02-07 ENCOUNTER — Encounter (HOSPITAL_COMMUNITY): Payer: Self-pay | Admitting: Emergency Medicine

## 2016-02-07 ENCOUNTER — Encounter (HOSPITAL_COMMUNITY)
Admission: EM | Disposition: A | Discharge status: Home / Self Care | Payer: Self-pay | Source: Home / Self Care | Attending: Neurosurgery

## 2016-02-07 ENCOUNTER — Inpatient Hospital Stay (HOSPITAL_COMMUNITY): Payer: Medicare Other | Admitting: Certified Registered Nurse Anesthetist

## 2016-02-07 ENCOUNTER — Inpatient Hospital Stay (HOSPITAL_COMMUNITY)
Admission: EM | Admit: 2016-02-07 | Discharge: 2016-02-09 | DRG: 026 | Disposition: A | Discharge status: Home / Self Care | Payer: Medicare Other | Attending: Neurological Surgery | Admitting: Neurological Surgery

## 2016-02-07 DIAGNOSIS — Z7951 Long term (current) use of inhaled steroids: Secondary | ICD-10-CM | POA: Diagnosis not present

## 2016-02-07 DIAGNOSIS — Y838 Other surgical procedures as the cause of abnormal reaction of the patient, or of later complication, without mention of misadventure at the time of the procedure: Secondary | ICD-10-CM | POA: Diagnosis present

## 2016-02-07 DIAGNOSIS — Z881 Allergy status to other antibiotic agents status: Secondary | ICD-10-CM | POA: Diagnosis not present

## 2016-02-07 DIAGNOSIS — J449 Chronic obstructive pulmonary disease, unspecified: Secondary | ICD-10-CM | POA: Diagnosis present

## 2016-02-07 DIAGNOSIS — G9600 Cerebrospinal fluid leak, unspecified: Secondary | ICD-10-CM | POA: Diagnosis present

## 2016-02-07 DIAGNOSIS — F17210 Nicotine dependence, cigarettes, uncomplicated: Secondary | ICD-10-CM | POA: Diagnosis present

## 2016-02-07 DIAGNOSIS — Z79899 Other long term (current) drug therapy: Secondary | ICD-10-CM | POA: Diagnosis not present

## 2016-02-07 DIAGNOSIS — R519 Headache, unspecified: Secondary | ICD-10-CM

## 2016-02-07 DIAGNOSIS — G9782 Other postprocedural complications and disorders of nervous system: Secondary | ICD-10-CM | POA: Diagnosis present

## 2016-02-07 DIAGNOSIS — Z981 Arthrodesis status: Secondary | ICD-10-CM

## 2016-02-07 DIAGNOSIS — R51 Headache: Secondary | ICD-10-CM

## 2016-02-07 HISTORY — PX: WOUND EXPLORATION: SHX6188

## 2016-02-07 LAB — PROTIME-INR
INR: 1.01 (ref 0.00–1.49)
PROTHROMBIN TIME: 13.5 s (ref 11.6–15.2)

## 2016-02-07 LAB — BASIC METABOLIC PANEL
ANION GAP: 8 (ref 5–15)
BUN: 10 mg/dL (ref 6–20)
CALCIUM: 8.7 mg/dL — AB (ref 8.9–10.3)
CO2: 26 mmol/L (ref 22–32)
Chloride: 104 mmol/L (ref 101–111)
Creatinine, Ser: 0.72 mg/dL (ref 0.61–1.24)
GLUCOSE: 106 mg/dL — AB (ref 65–99)
Potassium: 3.9 mmol/L (ref 3.5–5.1)
Sodium: 138 mmol/L (ref 135–145)

## 2016-02-07 LAB — CBC WITH DIFFERENTIAL/PLATELET
BASOS ABS: 0 10*3/uL (ref 0.0–0.1)
BASOS PCT: 0 %
Eosinophils Absolute: 0.1 10*3/uL (ref 0.0–0.7)
Eosinophils Relative: 1 %
HEMATOCRIT: 37.7 % — AB (ref 39.0–52.0)
Hemoglobin: 12.3 g/dL — ABNORMAL LOW (ref 13.0–17.0)
Lymphocytes Relative: 19 %
Lymphs Abs: 2.3 10*3/uL (ref 0.7–4.0)
MCH: 29.1 pg (ref 26.0–34.0)
MCHC: 32.6 g/dL (ref 30.0–36.0)
MCV: 89.1 fL (ref 78.0–100.0)
MONO ABS: 0.7 10*3/uL (ref 0.1–1.0)
Monocytes Relative: 6 %
NEUTROS ABS: 8.6 10*3/uL — AB (ref 1.7–7.7)
Neutrophils Relative %: 74 %
PLATELETS: 230 10*3/uL (ref 150–400)
RBC: 4.23 MIL/uL (ref 4.22–5.81)
RDW: 12.6 % (ref 11.5–15.5)
WBC: 11.6 10*3/uL — ABNORMAL HIGH (ref 4.0–10.5)

## 2016-02-07 LAB — APTT: APTT: 24 s (ref 24–37)

## 2016-02-07 SURGERY — WOUND EXPLORATION
Anesthesia: General | Site: Back

## 2016-02-07 MED ORDER — ONDANSETRON HCL 4 MG/2ML IJ SOLN
4.0000 mg | Freq: Once | INTRAMUSCULAR | Status: AC
  Administered 2016-02-07: 4 mg via INTRAVENOUS
  Filled 2016-02-07: qty 2

## 2016-02-07 MED ORDER — MAGNESIUM CITRATE PO SOLN: 1.0000 | Freq: Once | ORAL | Status: DC | PRN

## 2016-02-07 MED ORDER — TIOTROPIUM BROMIDE MONOHYDRATE 18 MCG IN CAPS
18.0000 ug | ORAL_CAPSULE | Freq: Every day | RESPIRATORY_TRACT | Status: DC
  Administered 2016-02-09: 18 ug via RESPIRATORY_TRACT
  Filled 2016-02-07: qty 5

## 2016-02-07 MED ORDER — DIAZEPAM 5 MG PO TABS
5.0000 mg | ORAL_TABLET | Freq: Four times a day (QID) | ORAL | Status: DC | PRN
  Administered 2016-02-09 (×2): 5 mg via ORAL
  Filled 2016-02-07 (×2): qty 1

## 2016-02-07 MED ORDER — HYDROMORPHONE HCL 1 MG/ML IJ SOLN
0.2500 mg | INTRAMUSCULAR | Status: DC | PRN
  Administered 2016-02-07 (×4): 0.5 mg via INTRAVENOUS

## 2016-02-07 MED ORDER — SUGAMMADEX SODIUM 200 MG/2ML IV SOLN
INTRAVENOUS | Status: DC | PRN
  Administered 2016-02-07: 300 mg via INTRAVENOUS

## 2016-02-07 MED ORDER — MIDAZOLAM HCL 5 MG/5ML IJ SOLN
INTRAMUSCULAR | Status: DC | PRN
  Administered 2016-02-07: 2 mg via INTRAVENOUS

## 2016-02-07 MED ORDER — PRAMIPEXOLE DIHYDROCHLORIDE 1 MG PO TABS
2.0000 mg | ORAL_TABLET | Freq: Every day | ORAL | Status: DC
  Administered 2016-02-07 – 2016-02-08 (×2): 2 mg via ORAL
  Filled 2016-02-07 (×2): qty 2

## 2016-02-07 MED ORDER — EPHEDRINE SULFATE 50 MG/ML IJ SOLN
INTRAMUSCULAR | Status: DC | PRN
  Administered 2016-02-07: 10 mg via INTRAVENOUS

## 2016-02-07 MED ORDER — POTASSIUM CHLORIDE IN NACL 20-0.9 MEQ/L-% IV SOLN
INTRAVENOUS | Status: DC
  Administered 2016-02-07 – 2016-02-08 (×2): via INTRAVENOUS
  Filled 2016-02-07 (×2): qty 1000

## 2016-02-07 MED ORDER — ONDANSETRON HCL 4 MG PO TABS
4.0000 mg | ORAL_TABLET | Freq: Four times a day (QID) | ORAL | Status: DC | PRN
Start: 2016-02-07 — End: 2016-02-09

## 2016-02-07 MED ORDER — MOMETASONE FURO-FORMOTEROL FUM 200-5 MCG/ACT IN AERO
2.0000 | INHALATION_SPRAY | Freq: Two times a day (BID) | RESPIRATORY_TRACT | Status: DC
  Filled 2016-02-07: qty 8.8

## 2016-02-07 MED ORDER — PROPOFOL 10 MG/ML IV BOLUS
INTRAVENOUS | Status: AC
  Filled 2016-02-07: qty 20

## 2016-02-07 MED ORDER — HYDROMORPHONE HCL 1 MG/ML IJ SOLN
1.0000 mg | INTRAMUSCULAR | Status: DC | PRN
  Administered 2016-02-07 – 2016-02-09 (×4): 1 mg via INTRAVENOUS
  Filled 2016-02-07 (×5): qty 1

## 2016-02-07 MED ORDER — ALENDRONATE SODIUM 70 MG PO TABS: 70.0000 mg | ORAL_TABLET | ORAL | Status: DC

## 2016-02-07 MED ORDER — HYDROMORPHONE HCL 1 MG/ML IJ SOLN
INTRAMUSCULAR | Status: AC
  Administered 2016-02-07: 0.5 mg via INTRAVENOUS
  Filled 2016-02-07: qty 1

## 2016-02-07 MED ORDER — MEPERIDINE HCL 25 MG/ML IJ SOLN: 6.2500 mg | INTRAMUSCULAR | Status: DC | PRN

## 2016-02-07 MED ORDER — LACTATED RINGERS IV SOLN
INTRAVENOUS | Status: DC | PRN
  Administered 2016-02-07 (×3): via INTRAVENOUS

## 2016-02-07 MED ORDER — DOCUSATE SODIUM 100 MG PO CAPS
100.0000 mg | ORAL_CAPSULE | Freq: Two times a day (BID) | ORAL | Status: DC
  Administered 2016-02-07 – 2016-02-09 (×3): 100 mg via ORAL
  Filled 2016-02-07 (×4): qty 1

## 2016-02-07 MED ORDER — LIDOCAINE HCL (CARDIAC) 20 MG/ML IV SOLN
INTRAVENOUS | Status: DC | PRN
  Administered 2016-02-07: 100 mg via INTRAVENOUS

## 2016-02-07 MED ORDER — CEFAZOLIN SODIUM-DEXTROSE 2-4 GM/100ML-% IV SOLN
INTRAVENOUS | Status: AC
  Administered 2016-02-07: 2 g via INTRAVENOUS
  Filled 2016-02-07: qty 100

## 2016-02-07 MED ORDER — ONDANSETRON HCL 4 MG/2ML IJ SOLN
INTRAMUSCULAR | Status: DC | PRN
  Administered 2016-02-07: 4 mg via INTRAVENOUS

## 2016-02-07 MED ORDER — KETOROLAC TROMETHAMINE 30 MG/ML IJ SOLN
30.0000 mg | Freq: Four times a day (QID) | INTRAMUSCULAR | Status: DC
  Administered 2016-02-07 – 2016-02-09 (×9): 30 mg via INTRAVENOUS
  Filled 2016-02-07 (×9): qty 1

## 2016-02-07 MED ORDER — OXYCODONE-ACETAMINOPHEN 7.5-325 MG PO TABS
1.0000 | ORAL_TABLET | ORAL | Status: DC | PRN
  Administered 2016-02-07 – 2016-02-09 (×4): 2 via ORAL
  Filled 2016-02-07 (×5): qty 2

## 2016-02-07 MED ORDER — GABAPENTIN 300 MG PO CAPS
300.0000 mg | ORAL_CAPSULE | Freq: Three times a day (TID) | ORAL | Status: DC
  Administered 2016-02-07 – 2016-02-09 (×6): 300 mg via ORAL
  Filled 2016-02-07 (×6): qty 1

## 2016-02-07 MED ORDER — MORPHINE SULFATE (PF) 2 MG/ML IV SOLN
2.0000 mg | INTRAVENOUS | Status: DC | PRN
  Administered 2016-02-07 – 2016-02-08 (×4): 2 mg via INTRAVENOUS
  Administered 2016-02-08 (×3): 4 mg via INTRAVENOUS
  Administered 2016-02-08 (×2): 2 mg via INTRAVENOUS
  Filled 2016-02-07 (×3): qty 1
  Filled 2016-02-07 (×2): qty 2
  Filled 2016-02-07 (×2): qty 1
  Filled 2016-02-07: qty 2
  Filled 2016-02-07: qty 1
  Filled 2016-02-07: qty 2

## 2016-02-07 MED ORDER — SENNOSIDES-DOCUSATE SODIUM 8.6-50 MG PO TABS: 1.0000 | ORAL_TABLET | Freq: Every evening | ORAL | Status: DC | PRN

## 2016-02-07 MED ORDER — SODIUM CHLORIDE 0.9% FLUSH
3.0000 mL | Freq: Two times a day (BID) | INTRAVENOUS | Status: DC
  Administered 2016-02-07 – 2016-02-09 (×3): 3 mL via INTRAVENOUS

## 2016-02-07 MED ORDER — SODIUM CHLORIDE 0.9 % IV SOLN: 250.0000 mL | INTRAVENOUS | Status: DC | PRN

## 2016-02-07 MED ORDER — METHOCARBAMOL 750 MG PO TABS
750.0000 mg | ORAL_TABLET | Freq: Four times a day (QID) | ORAL | Status: DC
  Administered 2016-02-07 – 2016-02-09 (×9): 750 mg via ORAL
  Filled 2016-02-07 (×9): qty 1

## 2016-02-07 MED ORDER — ROCURONIUM BROMIDE 100 MG/10ML IV SOLN
INTRAVENOUS | Status: DC | PRN
  Administered 2016-02-07: 50 mg via INTRAVENOUS

## 2016-02-07 MED ORDER — SODIUM CHLORIDE 0.9% FLUSH: 3.0000 mL | INTRAVENOUS | Status: DC | PRN

## 2016-02-07 MED ORDER — ACETAMINOPHEN-CODEINE #3 300-30 MG PO TABS: 1.0000 | ORAL_TABLET | ORAL | Status: DC | PRN

## 2016-02-07 MED ORDER — BUPIVACAINE HCL (PF) 0.5 % IJ SOLN
INTRAMUSCULAR | Status: DC | PRN
  Administered 2016-02-07: 20 mL

## 2016-02-07 MED ORDER — OXYCODONE HCL 20 MG PO TABS: 20.0000 mg | ORAL_TABLET | Freq: Four times a day (QID) | ORAL | Status: DC | PRN

## 2016-02-07 MED ORDER — MIDAZOLAM HCL 2 MG/2ML IJ SOLN
INTRAMUSCULAR | Status: AC
  Filled 2016-02-07: qty 2

## 2016-02-07 MED ORDER — ACETAMINOPHEN 650 MG RE SUPP: 650.0000 mg | Freq: Four times a day (QID) | RECTAL | Status: DC | PRN

## 2016-02-07 MED ORDER — ONDANSETRON HCL 4 MG/2ML IJ SOLN: 4.0000 mg | Freq: Once | INTRAMUSCULAR | Status: DC | PRN

## 2016-02-07 MED ORDER — KETOROLAC TROMETHAMINE 30 MG/ML IJ SOLN
INTRAMUSCULAR | Status: AC
  Administered 2016-02-07: 30 mg via INTRAVENOUS
  Filled 2016-02-07: qty 1

## 2016-02-07 MED ORDER — FENTANYL CITRATE (PF) 250 MCG/5ML IJ SOLN
INTRAMUSCULAR | Status: AC
  Filled 2016-02-07: qty 5

## 2016-02-07 MED ORDER — OXYCODONE HCL 5 MG PO TABS: 5.0000 mg | ORAL_TABLET | ORAL | Status: DC | PRN

## 2016-02-07 MED ORDER — ROFLUMILAST 500 MCG PO TABS
500.0000 ug | ORAL_TABLET | Freq: Every day | ORAL | Status: DC
  Administered 2016-02-08 – 2016-02-09 (×2): 500 ug via ORAL
  Filled 2016-02-07 (×2): qty 1

## 2016-02-07 MED ORDER — PROPOFOL 10 MG/ML IV BOLUS
INTRAVENOUS | Status: DC | PRN
  Administered 2016-02-07: 120 mg via INTRAVENOUS

## 2016-02-07 MED ORDER — ALBUTEROL SULFATE (2.5 MG/3ML) 0.083% IN NEBU: 2.5000 mg | INHALATION_SOLUTION | Freq: Four times a day (QID) | RESPIRATORY_TRACT | Status: DC | PRN

## 2016-02-07 MED ORDER — 0.9 % SODIUM CHLORIDE (POUR BTL) OPTIME
TOPICAL | Status: DC | PRN
  Administered 2016-02-07: 1000 mL

## 2016-02-07 MED ORDER — BISACODYL 5 MG PO TBEC: 5.0000 mg | DELAYED_RELEASE_TABLET | Freq: Every day | ORAL | Status: DC | PRN

## 2016-02-07 MED ORDER — THROMBIN 20000 UNITS EX SOLR
CUTANEOUS | Status: DC | PRN
  Administered 2016-02-07: 20 mL via TOPICAL

## 2016-02-07 MED ORDER — HYDROMORPHONE HCL 1 MG/ML IJ SOLN
1.0000 mg | Freq: Once | INTRAMUSCULAR | Status: AC
  Administered 2016-02-07: 1 mg via INTRAVENOUS
  Filled 2016-02-07: qty 1

## 2016-02-07 MED ORDER — ACETAMINOPHEN 325 MG PO TABS: 650.0000 mg | ORAL_TABLET | Freq: Four times a day (QID) | ORAL | Status: DC | PRN

## 2016-02-07 MED ORDER — LIDOCAINE 2% (20 MG/ML) 5 ML SYRINGE
INTRAMUSCULAR | Status: AC
Start: 2016-02-07 — End: 2016-02-07
  Filled 2016-02-07: qty 5

## 2016-02-07 MED ORDER — SODIUM CHLORIDE 0.9 % IV BOLUS (SEPSIS)
1000.0000 mL | Freq: Once | INTRAVENOUS | Status: AC
  Administered 2016-02-07: 1000 mL via INTRAVENOUS

## 2016-02-07 MED ORDER — ONDANSETRON HCL 4 MG/2ML IJ SOLN
4.0000 mg | Freq: Four times a day (QID) | INTRAMUSCULAR | Status: DC | PRN
  Administered 2016-02-07: 4 mg via INTRAVENOUS
  Filled 2016-02-07: qty 2

## 2016-02-07 MED ORDER — FENTANYL CITRATE (PF) 250 MCG/5ML IJ SOLN
INTRAMUSCULAR | Status: DC | PRN
  Administered 2016-02-07: 150 ug via INTRAVENOUS

## 2016-02-07 MED ORDER — BUPROPION HCL ER (XL) 150 MG PO TB24
150.0000 mg | ORAL_TABLET | Freq: Every day | ORAL | Status: DC
  Administered 2016-02-07 – 2016-02-08 (×2): 150 mg via ORAL
  Filled 2016-02-07 (×2): qty 1

## 2016-02-07 SURGICAL SUPPLY — 47 items
BAG DECANTER FOR FLEXI CONT (MISCELLANEOUS) ×3 IMPLANT
BENZOIN TINCTURE PRP APPL 2/3 (GAUZE/BANDAGES/DRESSINGS) ×3 IMPLANT
BLADE CLIPPER SURG (BLADE) IMPLANT
CANISTER SUCT 3000ML PPV (MISCELLANEOUS) ×3 IMPLANT
CLOSURE WOUND 1/2 X4 (GAUZE/BANDAGES/DRESSINGS) ×1
DRAPE LAPAROTOMY 100X72 PEDS (DRAPES) IMPLANT
DRAPE LAPAROTOMY 100X72X124 (DRAPES) IMPLANT
DRAPE POUCH INSTRU U-SHP 10X18 (DRAPES) ×3 IMPLANT
DRAPE SURG 17X23 STRL (DRAPES) ×12 IMPLANT
DRSG OPSITE POSTOP 4X8 (GAUZE/BANDAGES/DRESSINGS) ×3 IMPLANT
DURASEAL APPLICATOR TIP (TIP) ×3 IMPLANT
DURASEAL SPINE SEALANT 3ML (MISCELLANEOUS) ×3 IMPLANT
ELECT REM PT RETURN 9FT ADLT (ELECTROSURGICAL) ×3
ELECTRODE REM PT RTRN 9FT ADLT (ELECTROSURGICAL) ×1 IMPLANT
GAUZE SPONGE 4X4 12PLY STRL (GAUZE/BANDAGES/DRESSINGS) ×3 IMPLANT
GAUZE SPONGE 4X4 16PLY XRAY LF (GAUZE/BANDAGES/DRESSINGS) IMPLANT
GLOVE BIO SURGEON STRL SZ7 (GLOVE) ×9 IMPLANT
GLOVE ECLIPSE 6.5 STRL STRAW (GLOVE) ×3 IMPLANT
GLOVE EXAM NITRILE LRG STRL (GLOVE) IMPLANT
GLOVE EXAM NITRILE MD LF STRL (GLOVE) IMPLANT
GLOVE EXAM NITRILE XL STR (GLOVE) IMPLANT
GLOVE EXAM NITRILE XS STR PU (GLOVE) IMPLANT
GLOVE INDICATOR 7.5 STRL GRN (GLOVE) ×3 IMPLANT
GOWN STRL REUS W/ TWL LRG LVL3 (GOWN DISPOSABLE) IMPLANT
GOWN STRL REUS W/ TWL XL LVL3 (GOWN DISPOSABLE) IMPLANT
GOWN STRL REUS W/TWL LRG LVL3 (GOWN DISPOSABLE)
GOWN STRL REUS W/TWL XL LVL3 (GOWN DISPOSABLE)
KIT BASIN OR (CUSTOM PROCEDURE TRAY) ×3 IMPLANT
KIT ROOM TURNOVER OR (KITS) ×3 IMPLANT
NEEDLE HYPO 22GX1.5 SAFETY (NEEDLE) IMPLANT
NS IRRIG 1000ML POUR BTL (IV SOLUTION) ×3 IMPLANT
PACK LAMINECTOMY NEURO (CUSTOM PROCEDURE TRAY) ×3 IMPLANT
PAD ARMBOARD 7.5X6 YLW CONV (MISCELLANEOUS) ×9 IMPLANT
STRIP CLOSURE SKIN 1/2X4 (GAUZE/BANDAGES/DRESSINGS) ×2 IMPLANT
SUT PROLENE 6 0 BV (SUTURE) ×3 IMPLANT
SUT VIC AB 0 CT1 18XCR BRD8 (SUTURE) ×1 IMPLANT
SUT VIC AB 0 CT1 8-18 (SUTURE) ×2
SUT VIC AB 1 CT1 18XBRD ANBCTR (SUTURE) ×1 IMPLANT
SUT VIC AB 1 CT1 8-18 (SUTURE) ×2
SUT VIC AB 2-0 CP2 18 (SUTURE) ×3 IMPLANT
SUT VIC AB 2-0 CT1 18 (SUTURE) ×9 IMPLANT
SUT VIC AB 3-0 SH 8-18 (SUTURE) ×3 IMPLANT
SWAB COLLECTION DEVICE MRSA (MISCELLANEOUS) IMPLANT
TOWEL OR 17X24 6PK STRL BLUE (TOWEL DISPOSABLE) ×3 IMPLANT
TOWEL OR 17X26 10 PK STRL BLUE (TOWEL DISPOSABLE) ×3 IMPLANT
TUBE ANAEROBIC SPECIMEN COL (MISCELLANEOUS) IMPLANT
WATER STERILE IRR 1000ML POUR (IV SOLUTION) ×3 IMPLANT

## 2016-02-07 NOTE — Progress Notes (Signed)
Patient admitted from PACU. Patient alert and oriented x 4. Patient oriented to the room and made comfortable. Patient educated to lay flat as diagnosis is spinal fluid leak. Will continue to monitor.

## 2016-02-07 NOTE — Anesthesia Postprocedure Evaluation (Signed)
Anesthesia Post Note  Patient: Jason Ballard  Procedure(s) Performed: Procedure(s) (LRB): Lumbar WOUND EXPLORATION, Primary Dura Repair for Spinal Fluid Leak (N/A)  Patient location during evaluation: PACU Anesthesia Type: General Level of consciousness: awake and alert Pain management: pain level controlled Vital Signs Assessment: post-procedure vital signs reviewed and stable Respiratory status: spontaneous breathing, nonlabored ventilation, respiratory function stable and patient connected to nasal cannula oxygen Cardiovascular status: blood pressure returned to baseline and stable Postop Assessment: no signs of nausea or vomiting Anesthetic complications: no    Last Vitals:  Filed Vitals:   02/07/16 1240 02/07/16 1245  BP:  109/65  Pulse: 57 59  Temp:  36.4 C  Resp: 12 14    Last Pain:  Filed Vitals:   02/07/16 1302  PainSc: 4                  Cameshia Cressman DAVID

## 2016-02-07 NOTE — H&P (Signed)
Jason Ballard is an 49 y.o. male.   Chief Complaint: wound drainage HPI: Jason Ballard was recently discharged after a lumbar laminectomy for decompression at L4/5, 5/S1 on 02/05/16. Just discharged he noted drainage from his wound, contacted our office, and was instructed to cover the wound. I was called this am 0541 and told that he now had a severe headache, and increasing amounts of clear drainage from his wound. I instructed him to come to the ED for evaluation.   Past Medical History  Diagnosis Date  . COPD (chronic obstructive pulmonary disease) ([place])   . Emphysema   . Pneumonia   . Asthma     Past Surgical History  Procedure Laterality Date  . Back surgery  2008    neck surgery  . Nerve replacement in right elbow, 2008    . Appendectomy    . Anterior cervical decomp/discectomy fusion N/A 05/03/2013    Procedure: ANTERIOR CERVICAL DECOMPRESSION/DISCECTOMY FUSION Cervical Three-Four;  Surgeon: Faythe Ghee, MD;  Location: [place];  Service: Neurosurgery;  Laterality: N/A;  . Video assisted thoracoscopy (vats)/empyema Right 12/19/2014    Procedure: VIDEO ASSISTED THORACOSCOPY/right thoracotomy (VATS)/DRAIN EMPYEMA;  Surgeon: Ivin Poot, MD;  Location: [facility] OR;  Service: Thoracic;  Laterality: Right;  . Lumbar laminectomy/decompression microdiscectomy N/A 02/05/2016    Procedure: Lumbar four- five Lumbar five-Sacral one Laminectomy with Left Side foraminotomy;  Surgeon: Kevan Ny Ditty, MD;  Location: [place] [facility];  Service: Neurosurgery;  Laterality: N/A;  L4-5 L5-S1 Laminectomy with Left Side foraminotomy    Family History  Problem Relation Age of Onset  . Diabetes Mellitus II Neg Hx   . Stroke Neg Hx   . Thyroid disease Mother    Social History:  reports that he has been smoking Cigarettes.  He has a 8.25 pack-year smoking history. He has never used smokeless tobacco. He reports that he drinks alcohol. He reports that he does not use illicit drugs.  Allergies:   Allergies  Allergen Reactions  . Levaquin [Levofloxacin In D5w] Nausea And Vomiting and Other (See Comments)    Upset stomach      (Not in a hospital admission)  No results found for this or any previous visit (from the past 48 hour(s)). No results found.  Review of Systems  Eyes: Negative.   Respiratory: Positive for shortness of breath.   Cardiovascular: Negative.   Gastrointestinal: Negative.   Genitourinary: Negative.   Musculoskeletal: Positive for back pain.  Skin: Negative.   Neurological: Positive for weakness and headaches.  Endo/Heme/Allergies: Negative.   Psychiatric/Behavioral: Negative.     Blood pressure 116/52, pulse 56, temperature 97.4 F (36.3 C), temperature source Oral, resp. rate 18, height 5\' 8"  (1.727 m), weight 69.854 kg (154 lb), SpO2 96 %. Physical Exam  Constitutional: He is oriented to person, place, and time. He appears well-developed and well-nourished. He appears distressed.  HENT:  Head: Normocephalic and atraumatic.  Eyes: Conjunctivae and EOM are normal. Pupils are equal, round, and reactive to light.  Neck: Normal range of motion. Neck supple.  Cardiovascular: Normal rate, regular rhythm, normal heart sounds and intact distal pulses.   Respiratory: Effort normal.  GI: Soft. Bowel sounds are normal.  Neurological: He is alert and oriented to person, place, and time. He has normal reflexes. He displays normal reflexes. No cranial nerve deficit. He exhibits normal muscle tone. Coordination normal.  Skin: Skin is warm and dry.  Wound is ecchymotic, clear pink tinged fluid leaking from wound. No erythema  Assessment/Plan Though he is just out of surgery, I am suspicious enough about a csf leak that I think taking him to the OR for exploration is better than just closing the wound in the ED. The OR will allow for a definitive assessment. I have explained this to Jason Ballard along with the attendant risks of an operation, bleeding,  infection, damage to the nerves, bowel and or bladder dysfunction, and other risks. He understands and wishes to proceed.   Kaycen Whitworth L, MD 02/07/2016, 7:34 AM

## 2016-02-07 NOTE — Progress Notes (Signed)
Patient complaining of pain rating 6 on the scale of 10. Pharmacist had not verified patient's med. Writer called and asked for the medications to be verified.

## 2016-02-07 NOTE — Progress Notes (Signed)
Patient returned from surgery, alert and oriented x 4. Patient made comfortable at this time.

## 2016-02-07 NOTE — Progress Notes (Signed)
Patient ID: Jason Ballard, male   DOB: [birth year], 50 y.o.   MRN: [id number] C/o headache. To or today to explore lumbar wound

## 2016-02-07 NOTE — Op Note (Signed)
02/07/2016  12:08 PM  PATIENT:  Jason Ballard  50 y.o. male status post lumbar laminectomy for stenosis on 02/04/16. Post op he has had persistent drainage from is wound, and today developed a severe headache with clear drainage.I have recommended he be admitted and taken to the operating room for dural repair.  PRE-OPERATIVE DIAGNOSIS:  CSF LEAK  POST-OPERATIVE DIAGNOSIS:  CSF LEAK  PROCEDURE:  Procedure(s): Lumbar WOUND EXPLORATION, Primary Dura Repair for Spinal Fluid Leak  SURGEON: Surgeon(s): Ashok Pall, MD Leeroy Cha, MD  ASSISTANTS:[place], Stann Mainland  ANESTHESIA:   general  EBL:  Total I/O In: -  Out: 550 [Urine:500; Blood:50]  BLOOD ADMINISTERED:none  CELL SAVER GIVEN:none  COUNT:per nursing  DRAINS: none   SPECIMEN:  No Specimen  DICTATION: Eyan Hagood was taken to the operating room, intubated, and placed under a general anesthetic without difficulty. He had a foley catheter placed after anesthesia was induced, under sterile conditions. He was positioned prone on a Wilson frame with all pressure points properly padded. His old incision and lumbar region was prepped and draped in a sterile manner. I opened the old incision with scissors, and removed the previously placed sutures. Dr. Joya Salm and I were met with spinal fluid once we opened the wound. We used microscopic dissection and were able to find a small hole in the dura. I repaired the hole primarily with a prolene suture. We tested the closure with a  Valsalva maneuver, and the closure held. I placed duraseal on the closure. We irrigated then closed the wound. We closed in layers approximating the thoracolumbar fascia, subcutaneous, and subcuticular planes with vicryl sutures. We approximated the skin edges with nylon suture. I applied a sterile dressing. Mr. Lanum was then extubated and moving all extremities well.   PLAN OF CARE: Admit to inpatient   PATIENT DISPOSITION:  PACU - hemodynamically stable.   Delay start of Pharmacological VTE agent (>24hrs) due to surgical blood loss or risk of bleeding:  yes

## 2016-02-07 NOTE — ED Notes (Signed)
Pt presents to ER from home s/p lumbar spinal surgery (02/05/16); family at bedside expresses copious drainage that began as blood and now clear; pt was discharged from hospital on Friday and began experiencing symptoms at home; pt reporting headace today with photophobia

## 2016-02-07 NOTE — ED Notes (Signed)
Delo MD at bedside.

## 2016-02-07 NOTE — Anesthesia Procedure Notes (Signed)
Procedure Name: Intubation Date/Time: 02/07/2016 10:48 AM Performed by: Ollen Bowl Pre-anesthesia Checklist: Patient identified, Emergency Drugs available, Suction available, Patient being monitored and Timeout performed Patient Re-evaluated:Patient Re-evaluated prior to inductionOxygen Delivery Method: Circle system utilized and Simple face mask Preoxygenation: Pre-oxygenation with 100% oxygen Intubation Type: IV induction Ventilation: Mask ventilation without difficulty and Oral airway inserted - appropriate to patient size Laryngoscope Size: Sabra Heck and 2 Grade View: Grade I Tube type: Oral Tube size: 7.5 mm Number of attempts: 1 Airway Equipment and Method: Patient positioned with wedge pillow and Stylet Placement Confirmation: ETT inserted through vocal cords under direct vision,  positive ETCO2 and breath sounds checked- equal and bilateral Secured at: 21 cm Tube secured with: Tape Dental Injury: Teeth and Oropharynx as per pre-operative assessment

## 2016-02-07 NOTE — ED Provider Notes (Signed)
CSN: [id number]     Arrival date & time 02/07/16  [id number] History   First MD Initiated Contact with Patient 02/07/16 [phone]     Chief Complaint  Patient presents with  . Headache  . Drainage from Incision  . Post-op Problem     (Consider location/radiation/quality/duration/timing/severity/associated sxs/prior Treatment) HPI Comments: Patient is a 50 year old male with history of COPD. He has several days status post laminectomy performed by Dr. Cyndy Freeze. He presents today with complaints of drainage from his incision site as well as severe headache. He denies any new injury or trauma. He denies any fevers or chills. He was seen at [facility] and discharged with no change in therapy. His headache became worse yesterday evening and his wife called and spoke with the on-call neurosurgeon here. They were advised to come to the ER to be evaluated.  The history is provided by the patient.    Past Medical History  Diagnosis Date  . COPD (chronic obstructive pulmonary disease) ([place])   . Emphysema   . Pneumonia   . Asthma    Past Surgical History  Procedure Laterality Date  . Back surgery  2008    neck surgery  . Nerve replacement in right elbow, 2008    . Appendectomy    . Anterior cervical decomp/discectomy fusion N/A 05/03/2013    Procedure: ANTERIOR CERVICAL DECOMPRESSION/DISCECTOMY FUSION Cervical Three-Four;  Surgeon: Faythe Ghee, MD;  Location: [facility]UR[facility] OR[facility];  Service: Neurosurgery;  Laterality: N/A;  . Video assisted thoracoscopy (vats)/empyema Right 12/19/2014    Procedure: VIDEO ASSISTED THORACOSCOPY/right thoracotomy (VATS)/DRAIN EMPYEMA;  Surgeon: Ivin Poot, MD;  Location: [facility] OR;  Service: Thoracic;  Laterality: Right;  . Lumbar laminectomy/decompression microdiscectomy N/A 02/05/2016    Procedure: Lumbar four- five Lumbar five-Sacral one Laminectomy with Left Side foraminotomy;  Surgeon: Kevan Ny Ditty, MD;  Location: [place] NEURO ORS;  Service: Neurosurgery;  Laterality:  N/A;  L4-5 L5-S1 Laminectomy with Left Side foraminotomy   Family History  Problem Relation Age of Onset  . Diabetes Mellitus II Neg Hx   . Stroke Neg Hx   . Thyroid disease Mother    Social History  Substance Use Topics  . Smoking status: Current Every Day Smoker -- 0.25 packs/day for 33 years    Types: Cigarettes  . Smokeless tobacco: Never Used  . Alcohol Use: Yes     Comment: rare    Review of Systems  All other systems reviewed and are negative.     Allergies  Levaquin  Home Medications   Prior to Admission medications   Medication Sig Start Date End Date Taking? Authorizing Provider  Aclidinium Bromide 400 MCG/ACT AEPB Inhale 2 puffs into the lungs 2 (two) times daily. [place]    Historical Provider, MD  albuterol (PROVENTIL HFA;VENTOLIN HFA) 108 (90 BASE) MCG/ACT inhaler Inhale 2 puffs into the lungs every 6 (six) hours as needed for wheezing.    Historical Provider, MD  alendronate (FOSAMAX) 70 MG tablet Take 70 mg by mouth every Friday.  09/13/15   Historical Provider, MD  buPROPion (WELLBUTRIN XL) 150 MG 24 hr tablet Take 150 mg by mouth at bedtime.  08/21/15   Historical Provider, MD  cyclobenzaprine (FLEXERIL) 10 MG tablet Take 10 mg by mouth at bedtime as needed for muscle spasms.  10/06/15   Historical Provider, MD  gabapentin (NEURONTIN) 300 MG capsule Take 1 capsule (300 mg total) by mouth 3 (three) times daily. 02/05/16   Tamala Fothergill, MD  meloxicam [facility])  7.5 MG tablet Take 1 tablet (7.5 mg total) by mouth 2 (two) times daily. 02/05/16   Kevan Ny Ditty, MD  methocarbamol (ROBAXIN) 750 MG tablet Take 1 tablet (750 mg total) by mouth 4 (four) times daily. 02/05/16   Kevan Ny Ditty, MD  Oxycodone HCl 20 MG TABS Take 20 mg by mouth every 6 (six) hours as needed (For pain.).  09/13/15   Historical Provider, MD  oxyCODONE-acetaminophen (PERCOCET) 7.5-325 MG tablet Take 1-2 tablets by mouth every 4 (four) hours as needed for severe pain. 02/05/16   Kevan Ny Ditty, MD  pramipexole (MIRAPEX) 1 MG tablet Take 2 mg by mouth at bedtime.    Historical Provider, MD  roflumilast (DALIRESP) 500 MCG TABS tablet Take 500 mcg by mouth daily.    Historical Provider, MD  SYMBICORT 160-4.5 MCG/ACT inhaler Inhale 2 puffs into the lungs 2 (two) times daily. 01/27/16   Historical Provider, MD   BP 116/52 mmHg  Pulse 56  Temp(Src) 97.4 F (36.3 C) (Oral)  Resp 18  Ht 5\' 8"  (1.727 m)  Wt 154 lb (69.854 kg)  BMI 23.42 kg/m2  SpO2 96% Physical Exam  Constitutional: He is oriented to person, place, and time. He appears well-developed and well-nourished. No distress.  HENT:  Head: Normocephalic and atraumatic.  Mouth/Throat: Oropharynx is clear and moist.  Neck: Normal range of motion. Neck supple.  Cardiovascular: Normal rate and regular rhythm.  Exam reveals no friction rub.   No murmur heard. Pulmonary/Chest: Effort normal and breath sounds normal. No respiratory distress. He has no wheezes. He has no rales.  Abdominal: Soft. Bowel sounds are normal. He exhibits no distension. There is no tenderness.  Musculoskeletal: Normal range of motion. He exhibits no edema.  The lumbar incision site is noted to have mild erythema. A small amount of clear fluid is expressed from the wound site. There is no purulent drainage.  Neurological: He is alert and oriented to person, place, and time. Coordination normal.  Skin: Skin is warm and dry. He is not diaphoretic.  Nursing note and vitals reviewed.   ED Course  Procedures (including critical care time) Labs Review Labs Reviewed  BASIC METABOLIC PANEL  CBC WITH DIFFERENTIAL/PLATELET    Imaging Review No results found. I have personally reviewed and evaluated these images and lab results as part of my medical decision-making.   EKG Interpretation None      MDM   Final diagnoses:  None    Patient presents for evaluation of headache and drainage from his lumbar laminectomy site. An IV was  established and he was given fluids and pain medication. Laboratory studies reveal a white count of 11.6. I've discussed the case with Dr. Cyndy Freeze from neurosurgery who will evaluate the patient in the ER and admit. Due to his headache and fluid leaking from his incision, I have a concern for a spinal fluid leak.    Veryl Speak, MD 02/07/16 [phone]

## 2016-02-07 NOTE — Progress Notes (Signed)
Patient left for surgery.

## 2016-02-07 NOTE — Anesthesia Preprocedure Evaluation (Addendum)
Anesthesia Evaluation  Patient identified by MRN, date of birth, ID band Patient awake    Reviewed: Allergy & Precautions, NPO status , Patient's Chart, lab work & pertinent test results  Airway Mallampati: II  TM Distance: >3 FB Neck ROM: Full    Dental  (+) Teeth Intact, Dental Advisory Given   Pulmonary COPD, Current Smoker,    Pulmonary exam normal        Cardiovascular hypertension, Pt. on medications Normal cardiovascular exam     Neuro/Psych    GI/Hepatic   Endo/Other    Renal/GU      Musculoskeletal   Abdominal   Peds  Hematology   Anesthesia Other Findings   Reproductive/Obstetrics                            Anesthesia Physical Anesthesia Plan  ASA: II  Anesthesia Plan: General   Post-op Pain Management:    Induction: Intravenous  Airway Management Planned: Oral ETT  Additional Equipment:   Intra-op Plan:   Post-operative Plan: Extubation in OR  Informed Consent: I have reviewed the patients History and Physical, chart, labs and discussed the procedure including the risks, benefits and alternatives for the proposed anesthesia with the patient or authorized representative who has indicated his/her understanding and acceptance.     Plan Discussed with: CRNA and Surgeon  Anesthesia Plan Comments:         Anesthesia Quick Evaluation

## 2016-02-07 NOTE — Transfer of Care (Signed)
Immediate Anesthesia Transfer of Care Note  Patient: Jason Ballard  Procedure(s) Performed: Procedure(s): Lumbar WOUND EXPLORATION, Primary Dura Repair for Spinal Fluid Leak (N/A)  Patient Location: PACU  Anesthesia Type:General  Level of Consciousness: awake, alert  and oriented  Airway & Oxygen Therapy: Patient Spontanous Breathing and Patient connected to nasal cannula oxygen  Post-op Assessment: Report given to RN, Post -op Vital signs reviewed and stable and Patient moving all extremities X 4  Post vital signs: Reviewed and stable  Last Vitals:  Filed Vitals:   02/07/16 0911 02/07/16 1223  BP: 98/55 104/63  Pulse: 106 71  Temp: 36.7 C 36.4 C  Resp: 18 17    Last Pain:  Filed Vitals:   02/07/16 1226  PainSc: 2          Complications: No apparent anesthesia complications

## 2016-02-08 NOTE — Progress Notes (Signed)
Patient ID: Jason Ballard, male   DOB: [birth year], 50 y.o.   MRN: [id number] BP 120/72 mmHg  Pulse 66  Temp(Src) 98.3 F (36.8 C) (Oral)  Resp 17  Ht 5\' 8"  (1.727 m)  Wt 69.854 kg (154 lb)  BMI 23.42 kg/m2  SpO2 99% Alert and oriented x 4 Feeling better. Headache much improved Wound dressing is clean and dry Will allow him to get up tonight.

## 2016-02-09 ENCOUNTER — Encounter (HOSPITAL_COMMUNITY): Payer: Self-pay | Admitting: Neurosurgery

## 2016-02-09 NOTE — Progress Notes (Signed)
Patient is discharged from room 5N09 at this time. Alert and in stable condition. IV site d/c'd and instructions read to patients with understanding verbalized. Left unit via wheelchair with all belongings and family at side.

## 2016-02-09 NOTE — Discharge Summary (Signed)
Date of Admission: 02/07/2016  Date of Discharge: 02/09/2016  Admission Diagnosis: Lumbar cerebrospinal fluid leak  Discharge Diagnosis: Same  Procedure Performed: Wound exploration and repair of CSF leak  Attending: Kevan Ny Ditty, MD  Hospital Course:  The patient was admitted through the ER with a CSF leak.  He underwent the above listed operation and had an uncomplicated post-operative course.  They were discharged in stable condition.  Follow up: 3 weeks    Medication List    TAKE these medications        Aclidinium Bromide 400 MCG/ACT Aepb  Inhale 2 puffs into the lungs 2 (two) times daily. [place]     albuterol 108 (90 Base) MCG/ACT inhaler  Commonly known as:  PROVENTIL HFA;VENTOLIN HFA  Inhale 2 puffs into the lungs every 6 (six) hours as needed for wheezing.     alendronate 70 MG tablet  Commonly known as:  FOSAMAX  Take 70 mg by mouth every Friday.     buPROPion 150 MG 24 hr tablet  Commonly known as:  WELLBUTRIN XL  Take 150 mg by mouth at bedtime.     cyclobenzaprine 10 MG tablet  Commonly known as:  FLEXERIL  Take 10 mg by mouth at bedtime as needed for muscle spasms.     DALIRESP 500 MCG Tabs tablet  Generic drug:  roflumilast  Take 500 mcg by mouth daily.     gabapentin 300 MG capsule  Commonly known as:  NEURONTIN  Take 1 capsule (300 mg total) by mouth 3 (three) times daily.     meloxicam 7.5 MG tablet  Commonly known as:  MOBIC  Take 1 tablet (7.5 mg total) by mouth 2 (two) times daily.     methocarbamol 750 MG tablet  Commonly known as:  ROBAXIN  Take 1 tablet (750 mg total) by mouth 4 (four) times daily.     Oxycodone HCl 20 MG Tabs  Take 10-20 mg by mouth every 6 (six) hours as needed (For pain.).     oxyCODONE-acetaminophen 7.5-325 MG tablet  Commonly known as:  PERCOCET  Take 1-2 tablets by mouth every 4 (four) hours as needed for severe pain.     pramipexole 1 MG tablet  Commonly known as:  MIRAPEX  Take 2 mg by mouth at  bedtime.     SYMBICORT 160-4.5 MCG/ACT inhaler  Generic drug:  budesonide-formoterol  Inhale 2 puffs into the lungs 2 (two) times daily.

## 2016-02-09 NOTE — Progress Notes (Signed)
Feels well.  No headache. Moving legs well Incision looks good Stable Ready for d/c

## 2016-02-09 NOTE — Care Management Note (Signed)
Case Management Note  Patient Details  Name: Jason Ballard MRN: [id number] Date of Birth: Nov 13, 1965  Subjective/Objective:                    Action/Plan: Pt discharging to home with self care. No further needs per CM.   Expected Discharge Date:                  Expected Discharge Plan:  Home/Self Care  In-House Referral:     Discharge planning Services     Post Acute Care Choice:    Choice offered to:     DME Arranged:    DME Agency:     HH Arranged:    [place] Agency:     Status of Service:  Completed, signed off  Medicare Important Message Given:    Date Medicare IM Given:    Medicare IM give by:    Date Additional Medicare IM Given:    Additional Medicare Important Message give by:     If discussed at [place] of Stay Meetings, dates discussed:    Additional Comments:  Pollie Friar, RN 02/09/2016, 1:32 PM

## 2016-02-09 NOTE — Care Management Important Message (Signed)
Important Message  Patient Details  Name: Jason Ballard MRN: [id number] Date of Birth: August 16, 1966   Medicare Important Message Given:  Yes    Loann Quill 02/09/2016, 1:53 PM

## 2016-02-10 NOTE — Progress Notes (Signed)
Quick Note:  Called and spoke with pt. Reviewed results and recs. Pt voiced understanding and had no further questions. ______ 

## 2016-02-21 ENCOUNTER — Inpatient Hospital Stay (HOSPITAL_COMMUNITY)
Admission: EM | Admit: 2016-02-21 | Discharge: 2016-02-28 | DRG: 029 | Disposition: A | Discharge status: Home / Self Care | Payer: Medicare Other | Attending: Neurological Surgery | Admitting: Neurological Surgery

## 2016-02-21 ENCOUNTER — Encounter (HOSPITAL_COMMUNITY): Payer: Self-pay | Admitting: Emergency Medicine

## 2016-02-21 DIAGNOSIS — R51 Headache: Secondary | ICD-10-CM | POA: Diagnosis present

## 2016-02-21 DIAGNOSIS — Z881 Allergy status to other antibiotic agents status: Secondary | ICD-10-CM | POA: Diagnosis not present

## 2016-02-21 DIAGNOSIS — G9600 Cerebrospinal fluid leak, unspecified: Secondary | ICD-10-CM | POA: Diagnosis present

## 2016-02-21 DIAGNOSIS — Y838 Other surgical procedures as the cause of abnormal reaction of the patient, or of later complication, without mention of misadventure at the time of the procedure: Secondary | ICD-10-CM | POA: Diagnosis present

## 2016-02-21 DIAGNOSIS — J449 Chronic obstructive pulmonary disease, unspecified: Secondary | ICD-10-CM | POA: Diagnosis present

## 2016-02-21 DIAGNOSIS — I10 Essential (primary) hypertension: Secondary | ICD-10-CM | POA: Diagnosis present

## 2016-02-21 DIAGNOSIS — F17210 Nicotine dependence, cigarettes, uncomplicated: Secondary | ICD-10-CM | POA: Diagnosis present

## 2016-02-21 DIAGNOSIS — G9782 Other postprocedural complications and disorders of nervous system: Secondary | ICD-10-CM | POA: Diagnosis present

## 2016-02-21 LAB — CBC WITH DIFFERENTIAL/PLATELET
BASOS ABS: 0 10*3/uL (ref 0.0–0.1)
BASOS PCT: 0 %
EOS ABS: 0 10*3/uL (ref 0.0–0.7)
Eosinophils Relative: 0 %
HCT: 41.4 % (ref 39.0–52.0)
HEMOGLOBIN: 13.4 g/dL (ref 13.0–17.0)
Lymphocytes Relative: 29 %
Lymphs Abs: 4.2 10*3/uL — ABNORMAL HIGH (ref 0.7–4.0)
MCH: 28.7 pg (ref 26.0–34.0)
MCHC: 32.4 g/dL (ref 30.0–36.0)
MCV: 88.7 fL (ref 78.0–100.0)
MONOS PCT: 4 %
Monocytes Absolute: 0.6 10*3/uL (ref 0.1–1.0)
NEUTROS PCT: 67 %
Neutro Abs: 9.8 10*3/uL — ABNORMAL HIGH (ref 1.7–7.7)
Platelets: 402 10*3/uL — ABNORMAL HIGH (ref 150–400)
RBC: 4.67 MIL/uL (ref 4.22–5.81)
RDW: 12.5 % (ref 11.5–15.5)
WBC: 14.7 10*3/uL — AB (ref 4.0–10.5)

## 2016-02-21 LAB — BASIC METABOLIC PANEL
Anion gap: 5 (ref 5–15)
BUN: 18 mg/dL (ref 6–20)
CALCIUM: 9.1 mg/dL (ref 8.9–10.3)
CHLORIDE: 108 mmol/L (ref 101–111)
CO2: 24 mmol/L (ref 22–32)
CREATININE: 0.95 mg/dL (ref 0.61–1.24)
GFR calc non Af Amer: >60 mL/min (ref >60)
Glucose, Bld: 87 mg/dL (ref 65–99)
Potassium: 4 mmol/L (ref 3.5–5.1)
SODIUM: 137 mmol/L (ref 135–145)

## 2016-02-21 MED ORDER — SODIUM CHLORIDE 0.9 % IV SOLN
1000.0000 mL | Freq: Once | INTRAVENOUS | Status: AC
  Administered 2016-02-21: 1000 mL via INTRAVENOUS

## 2016-02-21 MED ORDER — ROFLUMILAST 500 MCG PO TABS
500.0000 ug | ORAL_TABLET | Freq: Every day | ORAL | Status: DC
  Administered 2016-02-21 – 2016-02-28 (×7): 500 ug via ORAL
  Filled 2016-02-21 (×9): qty 1

## 2016-02-21 MED ORDER — OXYCODONE HCL 20 MG PO TABS: 10.0000 mg | ORAL_TABLET | Freq: Four times a day (QID) | ORAL | Status: DC | PRN

## 2016-02-21 MED ORDER — TIOTROPIUM BROMIDE MONOHYDRATE 18 MCG IN CAPS
18.0000 ug | ORAL_CAPSULE | Freq: Every day | RESPIRATORY_TRACT | Status: DC
  Administered 2016-02-21 – 2016-02-25 (×3): 18 ug via RESPIRATORY_TRACT
  Filled 2016-02-21: qty 5

## 2016-02-21 MED ORDER — OXYCODONE-ACETAMINOPHEN 7.5-325 MG PO TABS
1.0000 | ORAL_TABLET | ORAL | Status: DC | PRN
  Administered 2016-02-21 – 2016-02-22 (×9): 2 via ORAL
  Administered 2016-02-23: 1 via ORAL
  Administered 2016-02-23 – 2016-02-24 (×4): 2 via ORAL
  Filled 2016-02-21 (×14): qty 2

## 2016-02-21 MED ORDER — GABAPENTIN 300 MG PO CAPS
300.0000 mg | ORAL_CAPSULE | Freq: Three times a day (TID) | ORAL | Status: DC
  Administered 2016-02-21 – 2016-02-28 (×21): 300 mg via ORAL
  Filled 2016-02-21 (×21): qty 1

## 2016-02-21 MED ORDER — ALBUTEROL SULFATE (2.5 MG/3ML) 0.083% IN NEBU: 3.0000 mL | INHALATION_SOLUTION | Freq: Four times a day (QID) | RESPIRATORY_TRACT | Status: DC | PRN

## 2016-02-21 MED ORDER — HYDROMORPHONE HCL 1 MG/ML IJ SOLN
1.0000 mg | Freq: Once | INTRAMUSCULAR | Status: AC
  Administered 2016-02-21: 1 mg via INTRAVENOUS
  Filled 2016-02-21: qty 1

## 2016-02-21 MED ORDER — METHOCARBAMOL 750 MG PO TABS
750.0000 mg | ORAL_TABLET | Freq: Four times a day (QID) | ORAL | Status: DC
  Administered 2016-02-21 – 2016-02-28 (×26): 750 mg via ORAL
  Filled 2016-02-21 (×5): qty 2
  Filled 2016-02-21: qty 1
  Filled 2016-02-21 (×11): qty 2
  Filled 2016-02-21: qty 1
  Filled 2016-02-21: qty 2
  Filled 2016-02-21: qty 1.5
  Filled 2016-02-21: qty 2
  Filled 2016-02-21 (×2): qty 1
  Filled 2016-02-21 (×4): qty 2

## 2016-02-21 MED ORDER — PRAMIPEXOLE DIHYDROCHLORIDE 1 MG PO TABS
2.0000 mg | ORAL_TABLET | Freq: Every day | ORAL | Status: DC
  Administered 2016-02-21 – 2016-02-27 (×7): 2 mg via ORAL
  Filled 2016-02-21 (×7): qty 2

## 2016-02-21 MED ORDER — DEXTROSE 5 % IV SOLN
2.0000 g | INTRAVENOUS | Status: DC
  Administered 2016-02-21 – 2016-02-27 (×7): 2 g via INTRAVENOUS
  Filled 2016-02-21 (×9): qty 2

## 2016-02-21 MED ORDER — BUPROPION HCL ER (XL) 150 MG PO TB24
150.0000 mg | ORAL_TABLET | Freq: Every day | ORAL | Status: DC
  Administered 2016-02-21 – 2016-02-27 (×7): 150 mg via ORAL
  Filled 2016-02-21 (×7): qty 1

## 2016-02-21 MED ORDER — MOMETASONE FURO-FORMOTEROL FUM 200-5 MCG/ACT IN AERO
2.0000 | INHALATION_SPRAY | Freq: Two times a day (BID) | RESPIRATORY_TRACT | Status: DC
  Administered 2016-02-25: 2 via RESPIRATORY_TRACT
  Filled 2016-02-21 (×2): qty 8.8

## 2016-02-21 MED ORDER — VANCOMYCIN HCL 10 G IV SOLR
1250.0000 mg | Freq: Two times a day (BID) | INTRAVENOUS | Status: DC
  Administered 2016-02-21 – 2016-02-27 (×13): 1250 mg via INTRAVENOUS
  Filled 2016-02-21 (×16): qty 1250

## 2016-02-21 MED ORDER — SODIUM CHLORIDE 0.9 % IV SOLN
1000.0000 mL | INTRAVENOUS | Status: DC
  Administered 2016-02-21 – 2016-02-24 (×7): 1000 mL via INTRAVENOUS

## 2016-02-21 MED ORDER — BUTALBITAL-APAP-CAFFEINE 50-325-40 MG PO TABS
1.0000 | ORAL_TABLET | ORAL | Status: DC | PRN
  Administered 2016-02-21 – 2016-02-22 (×2): 1 via ORAL
  Filled 2016-02-21 (×3): qty 1

## 2016-02-21 MED ORDER — MELOXICAM 7.5 MG PO TABS
7.5000 mg | ORAL_TABLET | Freq: Two times a day (BID) | ORAL | Status: DC
  Administered 2016-02-21 – 2016-02-28 (×14): 7.5 mg via ORAL
  Filled 2016-02-21 (×14): qty 1

## 2016-02-21 MED ORDER — ONDANSETRON HCL 4 MG/2ML IJ SOLN
4.0000 mg | Freq: Once | INTRAMUSCULAR | Status: AC
  Administered 2016-02-21: 4 mg via INTRAVENOUS
  Filled 2016-02-21: qty 2

## 2016-02-21 MED ORDER — ACETAZOLAMIDE ER 500 MG PO CP12
500.0000 mg | ORAL_CAPSULE | Freq: Two times a day (BID) | ORAL | Status: DC
  Administered 2016-02-21 – 2016-02-22 (×4): 500 mg via ORAL
  Filled 2016-02-21 (×6): qty 1

## 2016-02-21 NOTE — ED Provider Notes (Signed)
CSN: [id number]     Arrival date & time 02/21/16  0126 History   First MD Initiated Contact with Patient 02/21/16 [phone]     Chief Complaint  Patient presents with  . Headache  . Post-op Problem     (Consider location/radiation/quality/duration/timing/severity/associated sxs/prior Treatment) Patient is a 50 y.o. male presenting with headaches. The history is provided by the patient.  Headache He comes in with a spinal fluid leak. He had a lumbar laminectomy done on June 8 which is complicated by spinal fluid leak which was repaired on June 10. Since leaving the hospital on June 10, he has had a headache which is worse when he sits up. 2 days ago, he noted some bulging around his incision. He did see his neurosurgeon who did not feel it was any thing of concern. Tonight, he started leaking clear fluid from this area of bulging. He continues to have a headache which is worse when he sits up. He had been given a prescription for Fioricet which has not helped. He is also given prescription for dexamethasone and acetazolamide which have not helped. He denies fever or chills. He contacted the neurosurgeon on call who directed him to come to the ED.  Past Medical History  Diagnosis Date  . COPD (chronic obstructive pulmonary disease) ([place])   . Emphysema   . Pneumonia   . Asthma    Past Surgical History  Procedure Laterality Date  . Back surgery  2008    neck surgery  . Nerve replacement in right elbow, 2008    . Appendectomy    . Anterior cervical decomp/discectomy fusion N/A 05/03/2013    Procedure: ANTERIOR CERVICAL DECOMPRESSION/DISCECTOMY FUSION Cervical Three-Four;  Surgeon: Faythe Ghee, MD;  Location: [place] [facility]UR[facility] ORS;  Service: Neurosurgery;  Laterality: N/A;  . Video assisted thoracoscopy (vats)/empyema Right 12/19/2014    Procedure: VIDEO ASSISTED THORACOSCOPY/right thoracotomy (VATS)/DRAIN EMPYEMA;  Surgeon: Ivin Poot, MD;  Location: [facility] OR;  Service: Thoracic;  Laterality: Right;   . Lumbar laminectomy/decompression microdiscectomy N/A 02/05/2016    Procedure: Lumbar four- five Lumbar five-Sacral one Laminectomy with Left Side foraminotomy;  Surgeon: Kevan Ny Ditty, MD;  Location: [place] NEURO ORS;  Service: Neurosurgery;  Laterality: N/A;  L4-5 L5-S1 Laminectomy with Left Side foraminotomy  . Wound exploration N/A 02/07/2016    Procedure: Lumbar WOUND EXPLORATION, Primary Dura Repair for Spinal Fluid Leak;  Surgeon: Ashok Pall, MD;  Location: [place] NEUR[facility] ORS;  Service: Orthopedics;  Laterality: N/A;   Family History  Problem Relation Age of Onset  . Diabetes Mellitus II Neg Hx   . Stroke Neg Hx   . Thyroid disease Mother    Social History  Substance Use Topics  . Smoking status: Current Every Day Smoker -- 0.25 packs/day for 33 years    Types: Cigarettes  . Smokeless tobacco: Never Used  . Alcohol Use: Yes     Comment: rare    Review of Systems  Neurological: Positive for headaches.  All other systems reviewed and are negative.     Allergies  Levaquin  Home Medications   Prior to Admission medications   Medication Sig Start Date End Date Taking? Authorizing Provider  Aclidinium Bromide 400 MCG/ACT AEPB Inhale 2 puffs into the lungs 2 (two) times daily. [place]    Historical Provider, MD  albuterol (PROVENTIL HFA;VENTOLIN HFA) 108 (90 BASE) MCG/ACT inhaler Inhale 2 puffs into the lungs every 6 (six) hours as needed for wheezing.    Historical Provider, MD  alendronate (  FOSAMAX) 70 MG tablet Take 70 mg by mouth every Friday.  09/13/15   Historical Provider, MD  buPROPion (WELLBUTRIN XL) 150 MG 24 hr tablet Take 150 mg by mouth at bedtime.  08/21/15   Historical Provider, MD  cyclobenzaprine (FLEXERIL) 10 MG tablet Take 10 mg by mouth at bedtime as needed for muscle spasms.  10/06/15   Historical Provider, MD  gabapentin (NEURONTIN) 300 MG capsule Take 1 capsule (300 mg total) by mouth 3 (three) times daily. 02/05/16   Kevan Ny Ditty, MD  meloxicam  (MOBIC) 7.5 MG tablet Take 1 tablet (7.5 mg total) by mouth 2 (two) times daily. 02/05/16   Kevan Ny Ditty, MD  methocarbamol (ROBAXIN) 750 MG tablet Take 1 tablet (750 mg total) by mouth 4 (four) times daily. 02/05/16   Kevan Ny Ditty, MD  Oxycodone HCl 20 MG TABS Take 10-20 mg by mouth every 6 (six) hours as needed (For pain.).  09/13/15   Historical Provider, MD  oxyCODONE-acetaminophen (PERCOCET) 7.5-325 MG tablet Take 1-2 tablets by mouth every 4 (four) hours as needed for severe pain. Patient not taking: Reported on 02/07/2016 02/05/16   Kevan Ny Ditty, MD  pramipexole (MIRAPEX) 1 MG tablet Take 2 mg by mouth at bedtime.    Historical Provider, MD  roflumilast (DALIRESP) 500 MCG TABS tablet Take 500 mcg by mouth daily.    Historical Provider, MD  SYMBICORT 160-4.5 MCG/ACT inhaler Inhale 2 puffs into the lungs 2 (two) times daily. 01/27/16   Historical Provider, MD   BP 120/75 mmHg  Pulse 74  Temp(Src) 97.7 F (36.5 C) (Oral)  Resp 16  Ht 5\' 8"  (1.727 m)  Wt 154 lb (69.854 kg)  BMI 23.42 kg/m2  SpO2 99% Physical Exam  Nursing note and vitals reviewed.  50 year old male, resting comfortably and in no acute distress. Vital signs are normal. Oxygen saturation is 99%, which is normal. Head is normocephalic and atraumatic. PERRLA, EOMI. Oropharynx is clear. Neck is nontender and supple without adenopathy or JVD. Back: Surgical incision is present in the lumbar area without sign of infection. Sutures are in place. There is generalized puffiness surrounding the incision without fluctuance. I cannot actually identify any point of CSF leak, but the bandage that was over the wound is saturated.. Lungs are clear without rales, wheezes, or rhonchi. Chest is nontender. Heart has regular rate and rhythm without murmur. Abdomen is soft, flat, nontender without masses or hepatosplenomegaly and peristalsis is normoactive. Extremities have no cyanosis or edema, full range of motion is  present. Skin is warm and dry without rash. Neurologic: Mental status is normal, cranial nerves are intact, there are no motor or sensory deficits.  ED Course  Procedures (including critical care time) Labs Review Results for orders placed or performed during the hospital encounter of 02/21/16  CBC with Differential  Result Value Ref Range   WBC 14.7 (H) 4.0 - 10.5 K/uL   RBC 4.67 4.22 - 5.81 MIL/uL   Hemoglobin 13.4 13.0 - 17.0 g/dL   HCT 41.4 39.0 - 52.0 %   MCV 88.7 78.0 - 100.0 fL   MCH 28.7 26.0 - 34.0 pg   MCHC 32.4 30.0 - 36.0 g/dL   RDW 12.5 11.5 - 15.5 %   Platelets 402 (H) 150 - 400 K/uL   Neutrophils Relative % 67 %   Neutro Abs 9.8 (H) 1.7 - 7.7 K/uL   Lymphocytes Relative 29 %   Lymphs Abs 4.2 (H) 0.7 - 4.0 K/uL  Monocytes Relative 4 %   Monocytes Absolute 0.6 0.1 - 1.0 K/uL   Eosinophils Relative 0 %   Eosinophils Absolute 0.0 0.0 - 0.7 K/uL   Basophils Relative 0 %   Basophils Absolute 0.0 0.0 - 0.1 K/uL  Basic metabolic panel  Result Value Ref Range   Sodium 137 135 - 145 mmol/L   Potassium 4.0 3.5 - 5.1 mmol/L   Chloride 108 101 - 111 mmol/L   CO2 24 22 - 32 mmol/L   Glucose, Bld 87 65 - 99 mg/dL   BUN 18 6 - 20 mg/dL   Creatinine, Ser 0.95 0.61 - 1.24 mg/dL   Calcium 9.1 8.9 - 10.3 mg/dL   GFR calc non Af Amer >60 >60 mL/min   GFR calc Af Amer >60 >60 mL/min   Anion gap 5 5 - 15   I have personally reviewed and evaluated these lab results as part of my medical decision-making.   MDM   Final diagnoses:  Postoperative CSF leak    CSF leak following lumbar laminectomy. Old records are reviewed confirming lumbar laminectomy done on June 8, and exploration of site and repair of CSF leak than on June 10. Screening labs are obtained and he is given IV hydration as well as hydromorphone for pain.Case is discussed with Dr. Annette Stable who agrees to come to evaluate the patient in the ED.    Delora Fuel, MD [phone]

## 2016-02-21 NOTE — Progress Notes (Signed)
C/o headache Dressing dry Incision looks good Keep head of bed flat Diamox Fioricet Continue abx

## 2016-02-21 NOTE — ED Notes (Signed)
Patient arrives with complaint of spinal fluid leakage. States that he has had 2 recent back surgeries with the last on 6/10. Wife reports that tonight she awoke to find him soaked with spinal fluid. She examined the incision and noted that the entire area was leaking clear fluid. She placed a bandage over the site, but it continues to leak. This RN noted patient's shirt to be wet over the bandage.

## 2016-02-21 NOTE — Progress Notes (Signed)
Pharmacy Antibiotic Note  Jason Ballard is a 50 y.o. male admitted on 02/21/2016 with CSF leak - abx for prophylaxis .  Pharmacy has been consulted for vancomycin dosing.  He is s/p lumbar decompressive surgery with delayed post-op CSF leak s/p repair 02/07/16.  Now re-admitted with wound swelling and drainage.  Wt ~ 70kg, creat on 6/10 was 0.72.    Plan: Vancomycin 1250 IV every 12 hours.  Goal trough 10-15 mcg/mL.  Height: 5\' 8"  (172.7 cm) Weight: 154 lb (69.854 kg) IBW/kg (Calculated) : 68.4  Temp (24hrs), Avg:97.7 F (36.5 C), Min:97.7 F (36.5 C), Max:97.7 F (36.5 C)  No results for input(s): WBC, CREATININE, LATICACIDVEN, VANCOTROUGH, VANCOPEAK, VANCORANDOM, GENTTROUGH, GENTPEAK, GENTRANDOM, TOBRATROUGH, TOBRAPEAK, TOBRARND, AMIKACINPEAK, AMIKACINTROU, AMIKACIN in the last 168 hours.  Estimated Creatinine Clearance: 106.9 mL/min (by C-G formula based on Cr of 0.72).    Allergies  Allergen Reactions  . Levaquin [Levofloxacin In D5w] Nausea And Vomiting and Other (See Comments)    Upset stomach    Eudelia Bunch, Pharm.D. [id number] 02/21/2016 5:22 AM

## 2016-02-21 NOTE — H&P (Signed)
  Jason Ballard is an 50 y.o. male.   Chief Complaint: Wound drainage HPI: 50 year old male status post lumbar decompressive surgery with delayed postoperative CSF leak status post reexploration and primary repair 02/07/2016. Patient presents now with complaints of wound swelling and subsequent drainage. Patient complains postural headache. No fever. No new neurologic symptoms.  Past Medical History  Diagnosis Date  . COPD (chronic obstructive pulmonary disease) ([place])   . Emphysema   . Pneumonia   . Asthma     Past Surgical History  Procedure Laterality Date  . Back surgery  2008    neck surgery  . Nerve replacement in right elbow, 2008    . Appendectomy    . Anterior cervical decomp/discectomy fusion N/A 05/03/2013    Procedure: ANTERIOR CERVICAL DECOMPRESSION/DISCECTOMY FUSION Cervical Three-Four;  Surgeon: Faythe Ghee, MD;  Location: [place] [facility];  Service: Neurosurgery;  Laterality: N/A;  . Video assisted thoracoscopy (vats)/empyema Right 12/19/2014    Procedure: VIDEO ASSISTED THORACOSCOPY/right thoracotomy (VATS)/DRAIN EMPYEMA;  Surgeon: Ivin Poot, MD;  Location: [facility] OR;  Service: Thoracic;  Laterality: Right;  . Lumbar laminectomy/decompression microdiscectomy N/A 02/05/2016    Procedure: Lumbar four- five Lumbar five-Sacral one Laminectomy with Left Side foraminotomy;  Surgeon: Kevan Ny Ditty, MD;  Location: [place] [facility];  Service: Neurosurgery;  Laterality: N/A;  L4-5 L5-S1 Laminectomy with Left Side foraminotomy  . Wound exploration N/A 02/07/2016    Procedure: Lumbar WOUND EXPLORATION, Primary Dura Repair for Spinal Fluid Leak;  Surgeon: Ashok Pall, MD;  Location: [place] [facility];  Service: Orthopedics;  Laterality: N/A;    Family History  Problem Relation Age of Onset  . Diabetes Mellitus II Neg Hx   . Stroke Neg Hx   . Thyroid disease Mother    Social History:  reports that he has been smoking Cigarettes.  He has a 8.25 pack-year smoking history. He has never  used smokeless tobacco. He reports that he drinks alcohol. He reports that he does not use illicit drugs.  Allergies:  Allergies  Allergen Reactions  . Levaquin [Levofloxacin In D5w] Nausea And Vomiting and Other (See Comments)    Upset stomach      (Not in a hospital admission)  No results found for this or any previous visit (from the past 48 hour(s)). No results found.  Pertinent items noted in HPI and remainder of comprehensive ROS otherwise negative.  Blood pressure 115/77, pulse 57, temperature 97.7 F (36.5 C), temperature source Oral, resp. rate 16, height 5\' 8"  (1.727 m), weight 69.854 kg (154 lb), SpO2 100 %.  Patient is awake and alert. Oriented and appropriate. He does not appear to be any distress currently. Neck supple. Straight leg raising is negative bilaterally. Cranial nerve function is intact. Motor and sensory function extremities normal. Wound currently is clean and dry. There is minimal subcutaneous swelling. Nothing consistent with a pseudomeningocele. Patient reports prior drainage put currently there is none. The suture line is intact. The wound itself seems to healing well. Assessment/Plan Reported wound drainage in postural headaches worrisome for recurrent CSF leak. Currently his wound looks good in the emergency department. I would like to admit him to the hospital and place him on strict bedrest through the weekend. We will put him on prophylactic antibiotics. If CSF leak can be redemonstrated then possible lumbar drain placement.  Jason Ballard A 02/21/2016, 5:07 AM

## 2016-02-22 NOTE — Progress Notes (Signed)
Overall stable. Headache unchanged. No new lower extremity symptoms. No wound drainage. Unfortunately does appear to be developing larger fluid collection beneath this wound.  Postop CSF leak with enlarging suitably seal. Continue Diamox antibiotics with observation. Decision pending regarding wound exploration or possible lumbar drain.

## 2016-02-23 ENCOUNTER — Inpatient Hospital Stay (HOSPITAL_COMMUNITY): Payer: Medicare Other | Admitting: Certified Registered Nurse Anesthetist

## 2016-02-23 ENCOUNTER — Encounter (HOSPITAL_COMMUNITY)
Admission: EM | Disposition: A | Discharge status: Home / Self Care | Payer: Self-pay | Source: Home / Self Care | Attending: Neurological Surgery

## 2016-02-23 ENCOUNTER — Encounter (HOSPITAL_COMMUNITY): Payer: Self-pay | Admitting: Certified Registered Nurse Anesthetist

## 2016-02-23 HISTORY — PX: LUMBAR WOUND DEBRIDEMENT: SHX1988

## 2016-02-23 SURGERY — LUMBAR WOUND DEBRIDEMENT
Anesthesia: General | Site: Spine Lumbar

## 2016-02-23 MED ORDER — SODIUM CHLORIDE 0.9 % IR SOLN
Status: DC | PRN
  Administered 2016-02-23: 500 mL

## 2016-02-23 MED ORDER — VANCOMYCIN HCL 1000 MG IV SOLR
INTRAVENOUS | Status: AC
  Filled 2016-02-23: qty 1000

## 2016-02-23 MED ORDER — HYDROMORPHONE HCL 1 MG/ML IJ SOLN
INTRAMUSCULAR | Status: AC
Start: 2016-02-23 — End: 2016-02-24
  Filled 2016-02-23: qty 1

## 2016-02-23 MED ORDER — FENTANYL CITRATE (PF) 250 MCG/5ML IJ SOLN
INTRAMUSCULAR | Status: AC
  Filled 2016-02-23: qty 5

## 2016-02-23 MED ORDER — HYDROMORPHONE HCL 1 MG/ML IJ SOLN
0.2500 mg | INTRAMUSCULAR | Status: DC | PRN
  Administered 2016-02-23 (×4): 0.5 mg via INTRAVENOUS

## 2016-02-23 MED ORDER — PANTOPRAZOLE SODIUM 40 MG PO TBEC
40.0000 mg | DELAYED_RELEASE_TABLET | Freq: Every day | ORAL | Status: DC
  Administered 2016-02-23 – 2016-02-28 (×6): 40 mg via ORAL
  Filled 2016-02-23 (×5): qty 1

## 2016-02-23 MED ORDER — EPHEDRINE SULFATE 50 MG/ML IJ SOLN
INTRAMUSCULAR | Status: DC | PRN
  Administered 2016-02-23: 10 mg via INTRAVENOUS
  Administered 2016-02-23: 5 mg via INTRAVENOUS

## 2016-02-23 MED ORDER — THROMBIN 5000 UNITS EX SOLR
CUTANEOUS | Status: DC | PRN
  Administered 2016-02-23: 11:00:00 via TOPICAL

## 2016-02-23 MED ORDER — LACTATED RINGERS IV SOLN
INTRAVENOUS | Status: DC | PRN
  Administered 2016-02-23 (×2): via INTRAVENOUS

## 2016-02-23 MED ORDER — MIDAZOLAM HCL 2 MG/2ML IJ SOLN
INTRAMUSCULAR | Status: AC
  Filled 2016-02-23: qty 2

## 2016-02-23 MED ORDER — NEOSTIGMINE METHYLSULFATE 5 MG/5ML IV SOSY
PREFILLED_SYRINGE | INTRAVENOUS | Status: DC | PRN
  Administered 2016-02-23: 2 mg via INTRAVENOUS

## 2016-02-23 MED ORDER — LIDOCAINE 2% (20 MG/ML) 5 ML SYRINGE
INTRAMUSCULAR | Status: DC | PRN
  Administered 2016-02-23: 60 mg via INTRAVENOUS

## 2016-02-23 MED ORDER — HYDROMORPHONE HCL 1 MG/ML IJ SOLN
INTRAMUSCULAR | Status: AC
  Filled 2016-02-23: qty 1

## 2016-02-23 MED ORDER — CEFAZOLIN SODIUM-DEXTROSE 2-3 GM-% IV SOLR
INTRAVENOUS | Status: DC | PRN
  Administered 2016-02-23: 2 g via INTRAVENOUS

## 2016-02-23 MED ORDER — HEMOSTATIC AGENTS (NO CHARGE) OPTIME
TOPICAL | Status: DC | PRN
  Administered 2016-02-23: 1 via TOPICAL

## 2016-02-23 MED ORDER — PROPOFOL 10 MG/ML IV BOLUS
INTRAVENOUS | Status: DC | PRN
  Administered 2016-02-23: 60 mg via INTRAVENOUS
  Administered 2016-02-23: 200 mg via INTRAVENOUS

## 2016-02-23 MED ORDER — ACETAZOLAMIDE 250 MG PO TABS
250.0000 mg | ORAL_TABLET | Freq: Two times a day (BID) | ORAL | Status: DC
  Administered 2016-02-23 – 2016-02-28 (×10): 250 mg via ORAL
  Filled 2016-02-23 (×10): qty 1

## 2016-02-23 MED ORDER — MIDAZOLAM HCL 5 MG/5ML IJ SOLN
INTRAMUSCULAR | Status: DC | PRN
  Administered 2016-02-23: 2 mg via INTRAVENOUS

## 2016-02-23 MED ORDER — BUPIVACAINE-EPINEPHRINE (PF) 0.5% -1:200000 IJ SOLN
INTRAMUSCULAR | Status: DC | PRN
  Administered 2016-02-23: 15 mL

## 2016-02-23 MED ORDER — ONDANSETRON HCL 4 MG/2ML IJ SOLN
INTRAMUSCULAR | Status: DC | PRN
  Administered 2016-02-23: 4 mg via INTRAVENOUS

## 2016-02-23 MED ORDER — ROCURONIUM BROMIDE 10 MG/ML (PF) SYRINGE
PREFILLED_SYRINGE | INTRAVENOUS | Status: DC | PRN
  Administered 2016-02-23: 40 mg via INTRAVENOUS

## 2016-02-23 MED ORDER — VANCOMYCIN HCL 1000 MG IV SOLR
INTRAVENOUS | Status: DC | PRN
  Administered 2016-02-23: 1000 mg via TOPICAL

## 2016-02-23 MED ORDER — HYDROMORPHONE HCL 1 MG/ML IJ SOLN
0.5000 mg | INTRAMUSCULAR | Status: AC | PRN
  Administered 2016-02-23 (×4): 0.5 mg via INTRAVENOUS

## 2016-02-23 MED ORDER — 0.9 % SODIUM CHLORIDE (POUR BTL) OPTIME
TOPICAL | Status: DC | PRN
  Administered 2016-02-23: 1000 mL

## 2016-02-23 MED ORDER — PROMETHAZINE HCL 25 MG/ML IJ SOLN: 6.2500 mg | INTRAMUSCULAR | Status: DC | PRN

## 2016-02-23 MED ORDER — GLYCOPYRROLATE 0.2 MG/ML IV SOSY
PREFILLED_SYRINGE | INTRAVENOUS | Status: DC | PRN
  Administered 2016-02-23: .2 mg via INTRAVENOUS

## 2016-02-23 MED ORDER — LIDOCAINE-EPINEPHRINE 2 %-1:100000 IJ SOLN
INTRAMUSCULAR | Status: DC | PRN
  Administered 2016-02-23: 15 mL

## 2016-02-23 MED ORDER — PHENYLEPHRINE 40 MCG/ML (10ML) SYRINGE FOR IV PUSH (FOR BLOOD PRESSURE SUPPORT)
PREFILLED_SYRINGE | INTRAVENOUS | Status: DC | PRN
  Administered 2016-02-23 (×2): 80 ug via INTRAVENOUS
  Administered 2016-02-23: 40 ug via INTRAVENOUS
  Administered 2016-02-23: 80 ug via INTRAVENOUS
  Administered 2016-02-23 (×3): 40 ug via INTRAVENOUS
  Administered 2016-02-23: 80 ug via INTRAVENOUS
  Administered 2016-02-23: 40 ug via INTRAVENOUS
  Administered 2016-02-23 (×2): 80 ug via INTRAVENOUS
  Administered 2016-02-23: 40 ug via INTRAVENOUS

## 2016-02-23 MED ORDER — FENTANYL CITRATE (PF) 100 MCG/2ML IJ SOLN
INTRAMUSCULAR | Status: DC | PRN
  Administered 2016-02-23: 100 ug via INTRAVENOUS
  Administered 2016-02-23 (×3): 50 ug via INTRAVENOUS

## 2016-02-23 SURGICAL SUPPLY — 70 items
BAG DECANTER FOR FLEXI CONT (MISCELLANEOUS) ×3 IMPLANT
BENZOIN TINCTURE PRP APPL 2/3 (GAUZE/BANDAGES/DRESSINGS) IMPLANT
BIT DRILL NEURO 2X3.1 SFT TUCH (MISCELLANEOUS) IMPLANT
BLADE CLIPPER SURG (BLADE) IMPLANT
BLADE SURG 11 STRL SS (BLADE) IMPLANT
BUR ROUND FLUTED 5 RND (BURR) IMPLANT
BUR ROUND FLUTED 5MM RND (BURR)
CANISTER SUCT 3000ML PPV (MISCELLANEOUS) ×3 IMPLANT
CHLORAPREP W/TINT 26ML (MISCELLANEOUS) IMPLANT
CLOSURE WOUND 1/2 X4 (GAUZE/BANDAGES/DRESSINGS)
DECANTER SPIKE VIAL GLASS SM (MISCELLANEOUS) ×3 IMPLANT
DERMABOND ADVANCED (GAUZE/BANDAGES/DRESSINGS) ×6
DERMABOND ADVANCED .7 DNX12 (GAUZE/BANDAGES/DRESSINGS) ×3 IMPLANT
DRAIN SUBARACHNOID (WOUND CARE) ×3 IMPLANT
DRAPE MICROSCOPE LEICA (MISCELLANEOUS) ×3 IMPLANT
DRAPE POUCH INSTRU U-SHP 10X18 (DRAPES) ×3 IMPLANT
DRAPE SURG 17X23 STRL (DRAPES) ×3 IMPLANT
DRILL NEURO 2X3.1 SOFT TOUCH (MISCELLANEOUS)
DRSG OPSITE POSTOP 4X8 (GAUZE/BANDAGES/DRESSINGS) ×3 IMPLANT
DURASEAL APPLICATOR TIP (TIP) ×3 IMPLANT
DURASEAL SPINE SEALANT 3ML (MISCELLANEOUS) ×3 IMPLANT
ELECT REM PT RETURN 9FT ADLT (ELECTROSURGICAL) ×3
ELECTRODE REM PT RTRN 9FT ADLT (ELECTROSURGICAL) ×1 IMPLANT
GAUZE SPONGE 4X4 12PLY STRL (GAUZE/BANDAGES/DRESSINGS) IMPLANT
GAUZE SPONGE 4X4 16PLY XRAY LF (GAUZE/BANDAGES/DRESSINGS) IMPLANT
GLOVE BIOGEL PI IND STRL 7.5 (GLOVE) ×3 IMPLANT
GLOVE BIOGEL PI IND STRL 8 (GLOVE) ×1 IMPLANT
GLOVE BIOGEL PI INDICATOR 7.5 (GLOVE) ×6
GLOVE BIOGEL PI INDICATOR 8 (GLOVE) ×2
GLOVE ECLIPSE 7.5 STRL STRAW (GLOVE) ×9 IMPLANT
GLOVE EXAM NITRILE LRG STRL (GLOVE) IMPLANT
GLOVE EXAM NITRILE MD LF STRL (GLOVE) IMPLANT
GLOVE EXAM NITRILE XL STR (GLOVE) IMPLANT
GLOVE EXAM NITRILE XS STR PU (GLOVE) IMPLANT
GLOVE SS BIOGEL STRL SZ 7 (GLOVE) ×2 IMPLANT
GLOVE SUPERSENSE BIOGEL SZ 7 (GLOVE) ×4
GOWN STRL REUS W/ TWL LRG LVL3 (GOWN DISPOSABLE) ×1 IMPLANT
GOWN STRL REUS W/ TWL XL LVL3 (GOWN DISPOSABLE) ×3 IMPLANT
GOWN STRL REUS W/TWL LRG LVL3 (GOWN DISPOSABLE) ×2
GOWN STRL REUS W/TWL XL LVL3 (GOWN DISPOSABLE) ×6
GRAFT DURAGEN MATRIX 1WX1L (Tissue) ×3 IMPLANT
HEMOSTAT POWDER KIT SURGIFOAM (HEMOSTASIS) ×3 IMPLANT
KIT BASIN OR (CUSTOM PROCEDURE TRAY) ×3 IMPLANT
KIT ROOM TURNOVER OR (KITS) ×3 IMPLANT
NEEDLE HYPO 21X1.5 SAFETY (NEEDLE) ×3 IMPLANT
NEEDLE HYPO 25X1 1.5 SAFETY (NEEDLE) IMPLANT
NS IRRIG 1000ML POUR BTL (IV SOLUTION) ×3 IMPLANT
PACK LAMINECTOMY NEURO (CUSTOM PROCEDURE TRAY) ×3 IMPLANT
PACK UNIVERSAL I (CUSTOM PROCEDURE TRAY) ×3 IMPLANT
PAD ARMBOARD 7.5X6 YLW CONV (MISCELLANEOUS) ×15 IMPLANT
PATTIES SURGICAL .5X1.5 (GAUZE/BANDAGES/DRESSINGS) IMPLANT
RUBBERBAND STERILE (MISCELLANEOUS) ×6 IMPLANT
SPONGE NEURO XRAY DETECT 1X3 (DISPOSABLE) IMPLANT
SPONGE SURGIFOAM ABS GEL SZ50 (HEMOSTASIS) IMPLANT
STRIP CLOSURE SKIN 1/2X4 (GAUZE/BANDAGES/DRESSINGS) IMPLANT
SUT ETHILON 3 0 FSL (SUTURE) ×3 IMPLANT
SUT MNCRL AB 3-0 PS2 18 (SUTURE) ×3 IMPLANT
SUT VIC AB 0 CT1 18XCR BRD8 (SUTURE) ×2 IMPLANT
SUT VIC AB 0 CT1 8-18 (SUTURE) ×4
SUT VIC AB 2-0 CT1 18 (SUTURE) ×6 IMPLANT
SUT VIC AB 4-0 PS2 27 (SUTURE) IMPLANT
SWAB COLLECTION DEVICE MRSA (MISCELLANEOUS) ×3 IMPLANT
SWAB CULTURE ESWAB REG 1ML (MISCELLANEOUS) ×3 IMPLANT
SYR 20CC LL (SYRINGE) IMPLANT
SYR 30ML LL (SYRINGE) ×3 IMPLANT
TOWEL OR 17X24 6PK STRL BLUE (TOWEL DISPOSABLE) ×3 IMPLANT
TOWEL OR 17X26 10 PK STRL BLUE (TOWEL DISPOSABLE) ×3 IMPLANT
TUBE CONNECTING 12'X1/4 (SUCTIONS)
TUBE CONNECTING 12X1/4 (SUCTIONS) IMPLANT
WATER STERILE IRR 1000ML POUR (IV SOLUTION) ×3 IMPLANT

## 2016-02-23 NOTE — Anesthesia Procedure Notes (Signed)
Procedure Name: Intubation Date/Time: 02/23/2016 10:29 AM Performed by: Merdis Delay Pre-anesthesia Checklist: Suction available, Patient identified, Emergency Drugs available, Patient being monitored and Timeout performed Patient Re-evaluated:Patient Re-evaluated prior to inductionOxygen Delivery Method: Circle system utilized Preoxygenation: Pre-oxygenation with 100% oxygen Intubation Type: IV induction Ventilation: Mask ventilation without difficulty and Oral airway inserted - appropriate to patient size Laryngoscope Size: Mac and 4 Grade View: Grade I Tube type: Oral Tube size: 7.5 mm Number of attempts: 1 Airway Equipment and Method: Stylet Placement Confirmation: ETT inserted through vocal cords under direct vision,  positive ETCO2,  CO2 detector and breath sounds checked- equal and bilateral Secured at: 22 cm Tube secured with: Tape Dental Injury: Teeth and Oropharynx as per pre-operative assessment

## 2016-02-23 NOTE — Op Note (Signed)
02/21/2016 - 02/23/2016  2:07 PM  PATIENT:  Jason Ballard  50 y.o. male  PRE-OPERATIVE DIAGNOSIS:  Persistent CSF leak  POST-OPERATIVE DIAGNOSIS:  Same  PROCEDURE:  Exploration of lumbar wound, repair of CSF leak, placement of lumbar drain  SURGEON:  Aldean Ast, MD  ASSISTANTS: Jovita Gamma, M.D.  ANESTHESIA:   General  DRAINS: Lumbar drain   SPECIMEN:  Deep lumbar cultures  INDICATION FOR PROCEDURE: 50 year old man status post L4-5 and L5-S1 decompression and foraminotomies. He had a postoperative spinal fluid leak. This was repaired primarily by Dr. Christella Noa.  He went home and was doing well when he had a recurrence of pseudomeningocele and additional leak. Despite being on bedrest, Diamox, and Decadron he continued to leak. I recommended the above listed operation. Patient understood the risks, benefits, and alternatives and potential outcomes and wished to proceed.  PROCEDURE DETAILS: After smooth induction of general endotracheal anesthesia the patient was turned prone on the OR table. His low back was wiped down with alcohol. It was injected with lidocaine and Marcaine with epinephrine. I removed the sutures in his wound. He was then prepped and draped in the usual sterile fashion. A timeout was performed.  I reopened his wound. There was a large amount of clear spinal fluid in the wound cavity. The dura appeared healthy and the thecal sac appeared full. I asked anesthesia to perform Valsalva multiple times and was unable to identify source of the leak. I did see a stitch were Dr. Christella Noa repaired the leak prior to this. There was no egress of spinal fluid at this time. I placed a lumbar drain and the L2-3 interspace and tunneled this through the skin. I placed a piece of DuraGen over the prior repair. I coated this with DuraSeal. I placed vancomycin in the depths of the wound. I then closed the wound in routine anatomic layers with interrupted Vicryl sutures. I closed the  skin with a running locked Monocryl suture. I sealed this with Dermabond.  The patient was returned to the supine position on the stretcher and taken to the ICU.  PATIENT DISPOSITION:  PACU - hemodynamically stable.   Delay start of Pharmacological VTE agent (>24hrs) due to surgical blood loss or risk of bleeding:  yes

## 2016-02-23 NOTE — Brief Op Note (Signed)
02/21/2016 - 02/23/2016  11:54 AM  PATIENT:  Jason Ballard  50 y.o. male  PRE-OPERATIVE DIAGNOSIS:  CSF leak  POST-OPERATIVE DIAGNOSIS:  CSF leak  PROCEDURE:  Procedure(s): LUMBAR WOUND DEBRIDEMENT repair of CSF leak (N/A)  SURGEON:  Surgeon(s) and Role:    * Tamala Fothergill, MD - Primary    * Jovita Gamma, MD - Assisting  PHYSICIAN ASSISTANT:   ASSISTANTS: Jovita Gamma, MD  ANESTHESIA:   general  EBL:  Total I/O In: 1400 [I.V.:1400] Out: 225 [Urine:200; Blood:25]  BLOOD ADMINISTERED:none  DRAINS: Lumbar drain  LOCAL MEDICATIONS USED:  MARCAINE    and LIDOCAINE   SPECIMEN:  No Specimen  DISPOSITION OF SPECIMEN:  N/A  COUNTS:  YES  TOURNIQUET:  * No tourniquets in log *  DICTATION: .Dragon Dictation  PLAN OF CARE: Admit to inpatient   PATIENT DISPOSITION:  ICU - extubated and stable.   Delay start of Pharmacological VTE agent (>24hrs) due to surgical blood loss or risk of bleeding: yes

## 2016-02-23 NOTE — Care Management Important Message (Signed)
Important Message  Patient Details  Name: Jason Ballard MRN: [id number] Date of Birth: 1966-06-02   Medicare Important Message Given:  Yes    Loann Quill 02/23/2016, 8:53 AM

## 2016-02-23 NOTE — Progress Notes (Signed)
Leaked overnight Not leaking now Will explore wound, repair CSF leak, and place a lumbar drain Discussed with patient

## 2016-02-23 NOTE — Transfer of Care (Signed)
Immediate Anesthesia Transfer of Care Note  Patient: Jason Ballard  Procedure(s) Performed: Procedure(s): LUMBAR WOUND DEBRIDEMENT repair of CSF leak (N/A)  Patient Location: PACU  Anesthesia Type:General  Level of Consciousness: awake, alert  and oriented  Airway & Oxygen Therapy: Patient Spontanous Breathing and Patient connected to nasal cannula oxygen  Post-op Assessment: Report given to RN and Post -op Vital signs reviewed and stable  Post vital signs: Reviewed and stable  Last Vitals:  Filed Vitals:   02/23/16 0147 02/23/16 0615  BP: 98/73 120/73  Pulse: 72 65  Temp: 36.8 C 36.5 C  Resp: 18 20    Last Pain:  Filed Vitals:   02/23/16 0616  PainSc: 8       Patients Stated Pain Goal: 2 ([id number])  Complications: No apparent anesthesia complications

## 2016-02-23 NOTE — Progress Notes (Signed)
No acute post op events Doing well Moving legs well Drain at clavicle, adjust for 10cc/hr drainage Keep head flat

## 2016-02-23 NOTE — Anesthesia Preprocedure Evaluation (Addendum)
Anesthesia Evaluation  Patient identified by MRN, date of birth, ID band Patient awake    Reviewed: Allergy & Precautions, NPO status , Patient's Chart, lab work & pertinent test results  Airway Mallampati: II  TM Distance: >3 FB Neck ROM: Full    Dental  (+) Teeth Intact, Dental Advisory Given, Poor Dentition, Chipped,    Pulmonary COPD, Current Smoker,    Pulmonary exam normal        Cardiovascular hypertension, Pt. on medications Normal cardiovascular exam     Neuro/Psych negative neurological ROS     GI/Hepatic negative GI ROS, Neg liver ROS,   Endo/Other  negative endocrine ROS  Renal/GU negative Renal ROS     Musculoskeletal  (+) Arthritis ,   Abdominal   Peds  Hematology negative hematology ROS (+)   Anesthesia Other Findings   Reproductive/Obstetrics                            Lab Results  Component Value Date   WBC 14.7* 02/21/2016   HGB 13.4 02/21/2016   HCT 41.4 02/21/2016   MCV 88.7 02/21/2016   PLT 402* 02/21/2016   Lab Results  Component Value Date   CREATININE 0.95 02/21/2016   BUN 18 02/21/2016   NA 137 02/21/2016   K 4.0 02/21/2016   CL 108 02/21/2016   CO2 24 02/21/2016    Anesthesia Physical  Anesthesia Plan  ASA: II  Anesthesia Plan: General   Post-op Pain Management:    Induction: Intravenous  Airway Management Planned: Oral ETT  Additional Equipment:   Intra-op Plan:   Post-operative Plan: Extubation in OR  Informed Consent: I have reviewed the patients History and Physical, chart, labs and discussed the procedure including the risks, benefits and alternatives for the proposed anesthesia with the patient or authorized representative who has indicated his/her understanding and acceptance.     Plan Discussed with: CRNA and Surgeon  Anesthesia Plan Comments:         Anesthesia Quick Evaluation

## 2016-02-24 ENCOUNTER — Encounter (HOSPITAL_COMMUNITY): Payer: Self-pay | Admitting: Neurological Surgery

## 2016-02-24 LAB — BASIC METABOLIC PANEL
Anion gap: 10 (ref 5–15)
BUN: 7 mg/dL (ref 6–20)
CALCIUM: 9.4 mg/dL (ref 8.9–10.3)
CO2: 23 mmol/L (ref 22–32)
CREATININE: 0.92 mg/dL (ref 0.61–1.24)
Chloride: 108 mmol/L (ref 101–111)
GFR calc Af Amer: >60 mL/min (ref >60)
GFR calc non Af Amer: >60 mL/min (ref >60)
GLUCOSE: 91 mg/dL (ref 65–99)
Potassium: 4.9 mmol/L (ref 3.5–5.1)
Sodium: 141 mmol/L (ref 135–145)

## 2016-02-24 LAB — MRSA PCR SCREENING: MRSA by PCR: NEGATIVE

## 2016-02-24 MED ORDER — DIAZEPAM 5 MG PO TABS
5.0000 mg | ORAL_TABLET | Freq: Four times a day (QID) | ORAL | Status: DC | PRN
  Administered 2016-02-24 – 2016-02-27 (×9): 5 mg via ORAL
  Filled 2016-02-24 (×8): qty 1

## 2016-02-24 MED ORDER — MORPHINE SULFATE (PF) 4 MG/ML IV SOLN
4.0000 mg | Freq: Once | INTRAVENOUS | Status: AC
  Administered 2016-02-24: 4 mg via INTRAMUSCULAR
  Filled 2016-02-24: qty 1

## 2016-02-24 MED ORDER — DIAZEPAM 5 MG PO TABS
ORAL_TABLET | ORAL | Status: AC
  Filled 2016-02-24: qty 1

## 2016-02-24 MED ORDER — OXYCODONE HCL ER 20 MG PO T12A
20.0000 mg | EXTENDED_RELEASE_TABLET | Freq: Two times a day (BID) | ORAL | Status: DC
  Administered 2016-02-24 – 2016-02-28 (×9): 20 mg via ORAL
  Filled 2016-02-24 (×10): qty 1

## 2016-02-24 MED ORDER — CELECOXIB 200 MG PO CAPS
200.0000 mg | ORAL_CAPSULE | Freq: Two times a day (BID) | ORAL | Status: DC
  Administered 2016-02-24 – 2016-02-28 (×9): 200 mg via ORAL
  Filled 2016-02-24 (×9): qty 1

## 2016-02-24 MED ORDER — OXYCODONE HCL 5 MG PO TABS
5.0000 mg | ORAL_TABLET | ORAL | Status: DC | PRN
  Administered 2016-02-24 – 2016-02-28 (×24): 5 mg via ORAL
  Filled 2016-02-24 (×24): qty 1

## 2016-02-24 NOTE — Progress Notes (Signed)
Patient complains of worsening pain 10/10 in lower back and cramping sensation in gleut and quads. Upon 0400 assessment of pt lumbar dressing, dressing C/D/I but lower third of incision swollen and extremely tender to touch. Viona Gilmore, RN assessed as well.   Neurosurgery MD [facility] notified. Condition and drain status discussed. Orders given for 1x dose of morphine and ok to drain 15cc's hr out of lumbar drain. Orders placed. Will administer and continue to monitor.

## 2016-02-24 NOTE — Progress Notes (Addendum)
Pharmacy Antibiotic Note  Jason Ballard is a 50 y.o. male admitted on 02/21/2016 with CSF leak - abx for prophylaxis .  Pharmacy has been consulted for vancomycin dosing - D#4.  He is s/p lumbar decompressive surgery with delayed post-op CSF leak s/p repair 02/07/16.  Now re-admitted with wound swelling and drainage. Also on CTX per MD. No cultures. S/p wound debridement/exploration on 6/26. AF, wbc 14.7 on 6/24. Wt ~ 70kg, SCr 0.95 stable on 6/24.    Plan: Vancomycin 1250 IV every 12 hours CTX 2g IV q24h per MD Monitor clinical progress, c/s, renal function, abx plan/LOT VT@SS  as indicated   Height: 5\' 8"  (172.7 cm) Weight: 154 lb (69.854 kg) IBW/kg (Calculated) : 68.4  Temp (24hrs), Avg:98 F (36.7 C), Min:97.3 F (36.3 C), Max:98.5 F (36.9 C)   Recent Labs Lab 02/21/16 0430  WBC 14.7*  CREATININE 0.95    Estimated Creatinine Clearance: 90 mL/min (by C-G formula based on Cr of 0.95).    Allergies  Allergen Reactions  . Levaquin [Levofloxacin In D5w] Nausea And Vomiting and Other (See Comments)    Upset stomach    Elicia Lamp, PharmD, [facility] Clinical Pharmacist Pager [phone] 02/24/2016 7:44 AM

## 2016-02-24 NOTE — Care Management Note (Signed)
Case Management Note  Patient Details  Name: Jason Ballard MRN: [id number] Date of Birth: Jun 08, 1966  Subjective/Objective:    Pt admitted on 02/21/16 with postoperative CSF leak.  PTA, pt independent, lives with spouse.                 Action/Plan: Will follow for discharge planning as pt progresses.    Expected Discharge Date:                  Expected Discharge Plan:  Home/Self Care  In-House Referral:     Discharge planning Services  CM Consult  Post Acute Care Choice:    Choice offered to:     DME Arranged:    DME Agency:     HH Arranged:    HH Agency:     Status of Service:  In process, will continue to follow  If discussed at Long Length of Stay Meetings, dates discussed:    Additional Comments:  Reinaldo Raddle, RN, BSN  Trauma/Neuro ICU Case Manager [phone]

## 2016-02-24 NOTE — Anesthesia Postprocedure Evaluation (Signed)
Anesthesia Post Note  Patient: Jason Ballard  Procedure(s) Performed: Procedure(s) (LRB): LUMBAR WOUND DEBRIDEMENT repair of CSF leak (N/A)  Patient location during evaluation: PACU Anesthesia Type: General Level of consciousness: awake and alert Pain management: pain level controlled Vital Signs Assessment: post-procedure vital signs reviewed and stable Respiratory status: spontaneous breathing, nonlabored ventilation, respiratory function stable and patient connected to nasal cannula oxygen Cardiovascular status: blood pressure returned to baseline and stable Postop Assessment: no signs of nausea or vomiting Anesthetic complications: no    Last Vitals:  Filed Vitals:   02/24/16 0600 02/24/16 0700  BP: 101/55 119/75  Pulse: 83 88  Temp:    Resp: 17 18    Last Pain:  Filed Vitals:   02/24/16 0703  PainSc: 7                  Tiajuana Amass

## 2016-02-24 NOTE — Progress Notes (Signed)
No acute events Complaining of back pain and spasms Moving legs well Minor swelling around incision, no leak Drain patent Start valium, will adjust other meds as needed

## 2016-02-25 LAB — AEROBIC CULTURE  (SUPERFICIAL SPECIMEN)

## 2016-02-25 LAB — VANCOMYCIN, TROUGH: Vancomycin Tr: 15 ug/mL (ref 15–20)

## 2016-02-25 LAB — OCCULT BLOOD X 1 CARD TO LAB, STOOL: FECAL OCCULT BLD: NEGATIVE

## 2016-02-25 LAB — AEROBIC CULTURE W GRAM STAIN (SUPERFICIAL SPECIMEN): Culture: NO GROWTH

## 2016-02-25 MED ORDER — TAMSULOSIN HCL 0.4 MG PO CAPS
0.4000 mg | ORAL_CAPSULE | Freq: Every day | ORAL | Status: DC
  Administered 2016-02-25 – 2016-02-28 (×4): 0.4 mg via ORAL
  Filled 2016-02-25 (×4): qty 1

## 2016-02-25 NOTE — Progress Notes (Signed)
Pharmacy Antibiotic Note  Jason Ballard is a 50 y.o. male admitted on 02/21/2016 with CSF leak - abx for prophylaxis .  Pharmacy has been consulted for vancomycin dosing - D#5.  He is s/p lumbar decompressive surgery with delayed post-op CSF leak s/p repair 02/07/16.  Now re-admitted with wound swelling and drainage. Also on CTX per MD. No cultures. S/p wound debridement/exploration on 6/26.  AF, wbc 14.7 on 6/24. Wt ~ 70kg, SCr 0.9   Vancomycin trough this evening was at goal (15) drawn about an hour late. No adjustments needed.   Plan: Vancomycin 1250 IV every 12 hours CTX 2g IV q24h per MD Monitor clinical progress, c/s, renal function, abx plan/LOT Recheck VT as indicated   Height: 5\' 8"  (172.7 cm) Weight: 154 lb (69.854 kg) IBW/kg (Calculated) : 68.4  Temp (24hrs), Avg:98.1 F (36.7 C), Min:97.6 F (36.4 C), Max:98.4 F (36.9 C)   Recent Labs Lab 02/21/16 0430 02/24/16 0746 02/25/16 1821  WBC 14.7*  --   --   CREATININE 0.95 0.92  --   VANCOTROUGH  --   --  15    Estimated Creatinine Clearance: 92.9 mL/min (by C-G formula based on Cr of 0.92).    Allergies  Allergen Reactions  . Levaquin [Levofloxacin In D5w] Nausea And Vomiting and Other (See Comments)    Upset stomach    Erin Hearing PharmD., BCPS Clinical Pharmacist Pager [phone] 02/25/2016 8:17 PM

## 2016-02-25 NOTE — Care Management Important Message (Signed)
Important Message  Patient Details  Name: Jason Ballard MRN: [id number] Date of Birth: 09/15/1965   Medicare Important Message Given:  Yes    Loann Quill 02/25/2016, 10:06 AM

## 2016-02-25 NOTE — Progress Notes (Signed)
No acute events Back pain improved Drain patent Moving legs well Incision flat Continue drainage

## 2016-02-26 NOTE — Progress Notes (Signed)
No acute events Pain better Neuro stable Back flat Continue drain for one more day

## 2016-02-27 LAB — BASIC METABOLIC PANEL
ANION GAP: 6 (ref 5–15)
BUN: 13 mg/dL (ref 6–20)
CALCIUM: 9 mg/dL (ref 8.9–10.3)
CHLORIDE: 110 mmol/L (ref 101–111)
CO2: 22 mmol/L (ref 22–32)
Creatinine, Ser: 0.87 mg/dL (ref 0.61–1.24)
GFR calc non Af Amer: >60 mL/min (ref >60)
Glucose, Bld: 95 mg/dL (ref 65–99)
POTASSIUM: 3.6 mmol/L (ref 3.5–5.1)
Sodium: 138 mmol/L (ref 135–145)

## 2016-02-27 NOTE — Progress Notes (Signed)
Patient is transferred to room 5C13 from unit 67M at this time. Alert and in stable condition.

## 2016-02-27 NOTE — Care Management Important Message (Signed)
Important Message  Patient Details  Name: Jason Ballard MRN: [id number] Date of Birth: [birth year]   Medicare Important Message Given:  Yes    [name] Abena 02/27/2016, 12:01 PM

## 2016-02-27 NOTE — Progress Notes (Signed)
Pharmacy Antibiotic Note  Jason Ballard is a 50 y.o. male admitted on 02/21/2016 with CSF leak - abx for prophylaxis .  Pharmacy has been consulted for vancomycin dosing - D#7.  He is s/p lumbar decompressive surgery with delayed post-op CSF leak s/p repair 02/07/16.  Drain has been removed today. D/w Dr. Cyndy Freeze, ok to stop abx.     Plan:  Dc abx   Height: 5\' 8"  (172.7 cm) Weight: 156 lb 11.2 oz (71.079 kg) IBW/kg (Calculated) : 68.4  Temp (24hrs), Avg:97.6 F (36.4 C), Min:96.7 F (35.9 C), Max:98.1 F (36.7 C)   Recent Labs Lab 02/21/16 0430 02/24/16 0746 02/25/16 1821 02/27/16 0408  WBC 14.7*  --   --   --   CREATININE 0.95 0.92  --  0.87  VANCOTROUGH  --   --  15  --     Estimated Creatinine Clearance: 98.3 mL/min (by C-G formula based on Cr of 0.87).    Allergies  Allergen Reactions  . Levaquin [Levofloxacin In D5w] Nausea And Vomiting and Other (See Comments)    Upset stomach    [name], PharmD Pager: [phone] 02/27/2016 2:34 PM

## 2016-02-27 NOTE — Progress Notes (Signed)
No acute events Moving legs well Incision c/d/i and flat Drain removed today Nylon stitch in drain exit site Transfer to floor Slowly elevate head

## 2016-02-28 NOTE — Progress Notes (Signed)
Patient is being d/c home. Dc instructions given and patient verbalized understanding. Volunteers called for wheel chair.

## 2016-02-28 NOTE — Discharge Instructions (Signed)
If there is any fluid leaking from the wound call the office. If the wound becomes red, tender, drains pus call the office. If your temperature is greater than 101.5 please call the office.

## 2016-02-28 NOTE — Discharge Summary (Signed)
Physician Discharge Summary  Patient ID: Jason Ballard MRN: [id number] DOB/AGE: 1966-06-18 50 y.o.  Admit date: 02/21/2016 Discharge date: 02/28/2016  Admission Diagnoses: CSF leak  Discharge Diagnoses:  Active Problems:   Postoperative CSF leak   Discharged Condition: good  Hospital Course: Jason Ballard returned to the hospital for a csf leak status post lumbar decompression in early June. He was taken to the operating room and had a primary repair of the leak. He was discharged, but again developed a leak . He was taken back to the operating room and received a lumbar drain. His wound has remained flat and dry since. He will be discharged home today.   Treatments: surgery: Exploration of lumbar wound, repair of CSF leak, placement of lumbar drain   Discharge Exam: Blood pressure 106/63, pulse 86, temperature 97.6 F (36.4 C), temperature source Oral, resp. rate 18, height 5\' 8"  (1.727 m), weight 71.079 kg (156 lb 11.2 oz), SpO2 99 %. General appearance: alert, cooperative, appears stated age and no distress Neurologic: Alert and oriented X 3, normal strength and tone. Normal symmetric reflexes. Normal coordination and gait  Disposition: 01-Home or Self Care CSF leak    Medication List    TAKE these medications        acetaZOLAMIDE 250 MG tablet  Commonly known as:  DIAMOX  Take 250 mg by mouth 2 (two) times daily. For seven days     Aclidinium Bromide 400 MCG/ACT Aepb  Inhale 2 puffs into the lungs 2 (two) times daily. TUDORZA     albuterol 108 (90 Base) MCG/ACT inhaler  Commonly known as:  PROVENTIL HFA;VENTOLIN HFA  Inhale 2 puffs into the lungs every 6 (six) hours as needed for wheezing.     alendronate 70 MG tablet  Commonly known as:  FOSAMAX  Take 70 mg by mouth every Friday.     buPROPion 150 MG 24 hr tablet  Commonly known as:  WELLBUTRIN XL  Take 150 mg by mouth at bedtime.     cyclobenzaprine 10 MG tablet  Commonly known as:  FLEXERIL  Take 10 mg by  mouth at bedtime as needed for muscle spasms.     DALIRESP 500 MCG Tabs tablet  Generic drug:  roflumilast  Take 500 mcg by mouth daily.     dexamethasone 4 MG tablet  Commonly known as:  DECADRON  Take 4 mg by mouth 4 (four) times daily. Four day supply     gabapentin 300 MG capsule  Commonly known as:  NEURONTIN  Take 1 capsule (300 mg total) by mouth 3 (three) times daily.     meloxicam 7.5 MG tablet  Commonly known as:  MOBIC  Take 1 tablet (7.5 mg total) by mouth 2 (two) times daily.     methocarbamol 750 MG tablet  Commonly known as:  ROBAXIN  Take 1 tablet (750 mg total) by mouth 4 (four) times daily.     omeprazole 40 MG capsule  Commonly known as:  PRILOSEC  Take 40 mg by mouth daily.     Oxycodone HCl 20 MG Tabs  Take 10-20 mg by mouth every 6 (six) hours as needed (For pain.).     pramipexole 1 MG tablet  Commonly known as:  MIRAPEX  Take 2 mg by mouth at bedtime.     SYMBICORT 160-4.5 MCG/ACT inhaler  Generic drug:  budesonide-formoterol  Inhale 2 puffs into the lungs 2 (two) times daily.           Follow-up Information  Follow up with Kevan Ny Ditty, MD In 2 weeks.   Specialty:  Neurosurgery   Why:  call the office to schedule an appointment   Contact information:   [place]  [id number] [phone]       Signed: Onalee Steinbach L 02/28/2016, 9:05 AM

## 2016-03-01 LAB — ANAEROBIC CULTURE: Culture: NO GROWTH

## 2016-03-05 ENCOUNTER — Inpatient Hospital Stay (HOSPITAL_COMMUNITY): Payer: Medicare Other | Admitting: Certified Registered"

## 2016-03-05 ENCOUNTER — Encounter (HOSPITAL_COMMUNITY)
Admission: AD | Disposition: A | Discharge status: Home / Self Care | Payer: Self-pay | Source: Ambulatory Visit | Attending: Neurosurgery

## 2016-03-05 ENCOUNTER — Inpatient Hospital Stay (HOSPITAL_COMMUNITY)
Admission: AD | Admit: 2016-03-05 | Discharge: 2016-03-17 | DRG: 030 | Disposition: A | Discharge status: Home / Self Care | Payer: Medicare Other | Source: Ambulatory Visit | Attending: Neurosurgery | Admitting: Neurosurgery

## 2016-03-05 ENCOUNTER — Inpatient Hospital Stay (HOSPITAL_COMMUNITY)
Admission: AD | Admit: 2016-03-05 | Discharge: 2016-03-05 | Disposition: A | Discharge status: Home / Self Care | Payer: Medicare Other | Source: Ambulatory Visit | Attending: Neurosurgery | Admitting: Neurosurgery

## 2016-03-05 ENCOUNTER — Other Ambulatory Visit: Payer: Self-pay | Admitting: Neurosurgery

## 2016-03-05 ENCOUNTER — Ambulatory Visit (HOSPITAL_COMMUNITY)
Admission: AD | Admit: 2016-03-05 | Discharge: 2016-03-05 | Disposition: A | Discharge status: Home / Self Care | Payer: Medicare Other | Source: Ambulatory Visit | Attending: Neurosurgery | Admitting: Neurosurgery

## 2016-03-05 ENCOUNTER — Encounter (HOSPITAL_COMMUNITY): Payer: Self-pay | Admitting: Anesthesiology

## 2016-03-05 DIAGNOSIS — G9782 Other postprocedural complications and disorders of nervous system: Secondary | ICD-10-CM | POA: Diagnosis present

## 2016-03-05 DIAGNOSIS — Z981 Arthrodesis status: Secondary | ICD-10-CM | POA: Diagnosis not present

## 2016-03-05 DIAGNOSIS — J449 Chronic obstructive pulmonary disease, unspecified: Secondary | ICD-10-CM | POA: Diagnosis present

## 2016-03-05 DIAGNOSIS — F17210 Nicotine dependence, cigarettes, uncomplicated: Secondary | ICD-10-CM | POA: Diagnosis present

## 2016-03-05 DIAGNOSIS — G9600 Cerebrospinal fluid leak, unspecified: Secondary | ICD-10-CM | POA: Diagnosis present

## 2016-03-05 DIAGNOSIS — I10 Essential (primary) hypertension: Secondary | ICD-10-CM | POA: Diagnosis present

## 2016-03-05 DIAGNOSIS — Z79899 Other long term (current) drug therapy: Secondary | ICD-10-CM

## 2016-03-05 HISTORY — PX: LUMBAR WOUND DEBRIDEMENT: SHX1988

## 2016-03-05 SURGERY — LUMBAR WOUND DEBRIDEMENT
Anesthesia: General | Site: Back

## 2016-03-05 MED ORDER — SUGAMMADEX SODIUM 200 MG/2ML IV SOLN
INTRAVENOUS | Status: DC | PRN
  Administered 2016-03-05: 150 mg via INTRAVENOUS

## 2016-03-05 MED ORDER — ALBUTEROL SULFATE HFA 108 (90 BASE) MCG/ACT IN AERS
INHALATION_SPRAY | RESPIRATORY_TRACT | Status: DC | PRN
  Administered 2016-03-05: 3 via RESPIRATORY_TRACT

## 2016-03-05 MED ORDER — CEFAZOLIN SODIUM-DEXTROSE 2-4 GM/100ML-% IV SOLN
INTRAVENOUS | Status: AC
  Administered 2016-03-05: 2 g via INTRAVENOUS
  Filled 2016-03-05: qty 100

## 2016-03-05 MED ORDER — OXYCODONE HCL 5 MG PO TABS: 5.0000 mg | ORAL_TABLET | Freq: Once | ORAL | Status: DC | PRN

## 2016-03-05 MED ORDER — HEMOSTATIC AGENTS (NO CHARGE) OPTIME
TOPICAL | Status: DC | PRN
  Administered 2016-03-05: 1 via TOPICAL

## 2016-03-05 MED ORDER — THROMBIN 5000 UNITS EX SOLR
CUTANEOUS | Status: DC | PRN
  Administered 2016-03-05 (×2): 5000 [IU] via TOPICAL

## 2016-03-05 MED ORDER — ROCURONIUM BROMIDE 100 MG/10ML IV SOLN
INTRAVENOUS | Status: DC | PRN
  Administered 2016-03-05: 50 mg via INTRAVENOUS

## 2016-03-05 MED ORDER — DEXAMETHASONE SODIUM PHOSPHATE 10 MG/ML IJ SOLN
INTRAMUSCULAR | Status: AC
  Filled 2016-03-05: qty 1

## 2016-03-05 MED ORDER — HYDROMORPHONE HCL 1 MG/ML IJ SOLN
INTRAMUSCULAR | Status: AC
  Administered 2016-03-05
  Filled 2016-03-05: qty 1

## 2016-03-05 MED ORDER — LIDOCAINE HCL (CARDIAC) 20 MG/ML IV SOLN
INTRAVENOUS | Status: DC | PRN
  Administered 2016-03-05: 60 mg via INTRAVENOUS

## 2016-03-05 MED ORDER — ONDANSETRON HCL 4 MG/2ML IJ SOLN: 4.0000 mg | Freq: Four times a day (QID) | INTRAMUSCULAR | Status: DC | PRN

## 2016-03-05 MED ORDER — FENTANYL CITRATE (PF) 250 MCG/5ML IJ SOLN
INTRAMUSCULAR | Status: AC
  Filled 2016-03-05: qty 5

## 2016-03-05 MED ORDER — PHENYLEPHRINE HCL 10 MG/ML IJ SOLN
10.0000 mg | INTRAVENOUS | Status: DC | PRN
  Administered 2016-03-05: 25 ug/min via INTRAVENOUS

## 2016-03-05 MED ORDER — MIDAZOLAM HCL 5 MG/5ML IJ SOLN
INTRAMUSCULAR | Status: DC | PRN
  Administered 2016-03-05: 2 mg via INTRAVENOUS

## 2016-03-05 MED ORDER — HYDROMORPHONE HCL 1 MG/ML IJ SOLN
0.2500 mg | INTRAMUSCULAR | Status: DC | PRN
  Administered 2016-03-05 – 2016-03-06 (×4): 0.5 mg via INTRAVENOUS

## 2016-03-05 MED ORDER — MORPHINE SULFATE (PF) 2 MG/ML IV SOLN
2.0000 mg | INTRAVENOUS | Status: DC | PRN
  Administered 2016-03-05 – 2016-03-06 (×3): 4 mg via INTRAVENOUS
  Administered 2016-03-06: 6 mg via INTRAVENOUS
  Administered 2016-03-06 (×3): 4 mg via INTRAVENOUS
  Administered 2016-03-07 (×4): 2 mg via INTRAVENOUS
  Administered 2016-03-08: 4 mg via INTRAVENOUS
  Administered 2016-03-08: 2 mg via INTRAVENOUS
  Administered 2016-03-09 (×2): 4 mg via INTRAVENOUS
  Administered 2016-03-09: 5 mg via INTRAVENOUS
  Administered 2016-03-09 – 2016-03-10 (×4): 4 mg via INTRAVENOUS
  Administered 2016-03-10: 6 mg via INTRAVENOUS
  Administered 2016-03-10: 2 mg via INTRAVENOUS
  Administered 2016-03-10: 3 mg via INTRAVENOUS
  Administered 2016-03-10: 4 mg via INTRAVENOUS
  Administered 2016-03-10: 2 mg via INTRAVENOUS
  Administered 2016-03-10 (×2): 4 mg via INTRAVENOUS
  Administered 2016-03-11 (×3): 6 mg via INTRAVENOUS
  Administered 2016-03-11 (×2): 4 mg via INTRAVENOUS
  Administered 2016-03-11 (×2): 6 mg via INTRAVENOUS
  Filled 2016-03-05: qty 1
  Filled 2016-03-05: qty 2
  Filled 2016-03-05 (×4): qty 1
  Filled 2016-03-05 (×2): qty 3
  Filled 2016-03-05 (×2): qty 2
  Filled 2016-03-05: qty 3
  Filled 2016-03-05 (×2): qty 2
  Filled 2016-03-05 (×2): qty 1
  Filled 2016-03-05 (×5): qty 2
  Filled 2016-03-05: qty 1
  Filled 2016-03-05: qty 2
  Filled 2016-03-05: qty 3
  Filled 2016-03-05: qty 1
  Filled 2016-03-05: qty 3
  Filled 2016-03-05 (×2): qty 1
  Filled 2016-03-05: qty 3
  Filled 2016-03-05: qty 2
  Filled 2016-03-05: qty 3
  Filled 2016-03-05 (×2): qty 2
  Filled 2016-03-05: qty 3
  Filled 2016-03-05 (×2): qty 2
  Filled 2016-03-05: qty 3
  Filled 2016-03-05: qty 2

## 2016-03-05 MED ORDER — IOPAMIDOL (ISOVUE-M 200) INJECTION 41%
INTRAMUSCULAR | Status: AC
  Administered 2016-03-05: 20 mg via INTRATHECAL
  Filled 2016-03-05: qty 10

## 2016-03-05 MED ORDER — ONDANSETRON HCL 4 MG/2ML IJ SOLN
INTRAMUSCULAR | Status: DC | PRN
  Administered 2016-03-05: 4 mg via INTRAVENOUS

## 2016-03-05 MED ORDER — FENTANYL CITRATE (PF) 100 MCG/2ML IJ SOLN
INTRAMUSCULAR | Status: DC | PRN
  Administered 2016-03-05: 150 ug via INTRAVENOUS
  Administered 2016-03-05 (×2): 50 ug via INTRAVENOUS

## 2016-03-05 MED ORDER — HYDROMORPHONE HCL 1 MG/ML IJ SOLN
INTRAMUSCULAR | Status: AC
  Filled 2016-03-05: qty 1

## 2016-03-05 MED ORDER — ONDANSETRON HCL 4 MG/2ML IJ SOLN
INTRAMUSCULAR | Status: AC
  Filled 2016-03-05: qty 2

## 2016-03-05 MED ORDER — PROPOFOL 10 MG/ML IV BOLUS
INTRAVENOUS | Status: DC | PRN
  Administered 2016-03-05: 170 mg via INTRAVENOUS

## 2016-03-05 MED ORDER — MIDAZOLAM HCL 2 MG/2ML IJ SOLN
INTRAMUSCULAR | Status: AC
  Filled 2016-03-05: qty 2

## 2016-03-05 MED ORDER — OXYCODONE HCL 5 MG/5ML PO SOLN: 5.0000 mg | Freq: Once | ORAL | Status: DC | PRN

## 2016-03-05 MED ORDER — ROCURONIUM BROMIDE 50 MG/5ML IV SOLN
INTRAVENOUS | Status: AC
  Filled 2016-03-05: qty 1

## 2016-03-05 MED ORDER — DEXAMETHASONE SODIUM PHOSPHATE 10 MG/ML IJ SOLN
INTRAMUSCULAR | Status: DC | PRN
  Administered 2016-03-05: 10 mg via INTRAVENOUS

## 2016-03-05 MED ORDER — ARTIFICIAL TEARS OP OINT
TOPICAL_OINTMENT | OPHTHALMIC | Status: DC | PRN
  Administered 2016-03-05: 1 via OPHTHALMIC

## 2016-03-05 MED ORDER — 0.9 % SODIUM CHLORIDE (POUR BTL) OPTIME
TOPICAL | Status: DC | PRN
  Administered 2016-03-05: 1000 mL

## 2016-03-05 MED ORDER — ALBUTEROL SULFATE HFA 108 (90 BASE) MCG/ACT IN AERS
INHALATION_SPRAY | RESPIRATORY_TRACT | Status: AC
  Filled 2016-03-05: qty 6.7

## 2016-03-05 MED ORDER — LIDOCAINE 2% (20 MG/ML) 5 ML SYRINGE
INTRAMUSCULAR | Status: AC
  Filled 2016-03-05: qty 5

## 2016-03-05 MED ORDER — LIDOCAINE HCL 1 % IJ SOLN
INTRAMUSCULAR | Status: AC
  Administered 2016-03-05: 2 mL
  Filled 2016-03-05: qty 10

## 2016-03-05 MED ORDER — PROPOFOL 10 MG/ML IV BOLUS
INTRAVENOUS | Status: AC
  Filled 2016-03-05: qty 20

## 2016-03-05 MED ORDER — SUGAMMADEX SODIUM 200 MG/2ML IV SOLN
INTRAVENOUS | Status: AC
  Filled 2016-03-05: qty 2

## 2016-03-05 MED ORDER — LACTATED RINGERS IV SOLN
INTRAVENOUS | Status: DC | PRN
Start: 2016-03-05 — End: 2016-03-05
  Administered 2016-03-05 (×2): via INTRAVENOUS

## 2016-03-05 SURGICAL SUPPLY — 68 items
BAG DECANTER FOR FLEXI CONT (MISCELLANEOUS) ×3 IMPLANT
BENZOIN TINCTURE PRP APPL 2/3 (GAUZE/BANDAGES/DRESSINGS) IMPLANT
BLADE CLIPPER SURG (BLADE) IMPLANT
BLADE SURG 11 STRL SS (BLADE) ×3 IMPLANT
BUR MATCHSTICK NEURO 3.0 LAGG (BURR) ×3 IMPLANT
CANISTER SUCT 3000ML PPV (MISCELLANEOUS) ×3 IMPLANT
CLOSURE WOUND 1/2 X4 (GAUZE/BANDAGES/DRESSINGS)
DECANTER SPIKE VIAL GLASS SM (MISCELLANEOUS) ×3 IMPLANT
DRAPE LAPAROTOMY 100X72X124 (DRAPES) ×3 IMPLANT
DRAPE POUCH INSTRU U-SHP 10X18 (DRAPES) ×3 IMPLANT
DRAPE SURG 17X23 STRL (DRAPES) ×3 IMPLANT
DRSG OPSITE POSTOP 4X8 (GAUZE/BANDAGES/DRESSINGS) ×3 IMPLANT
DURAPREP 26ML APPLICATOR (WOUND CARE) ×3 IMPLANT
DURASEAL APPLICATOR TIP (TIP) ×3 IMPLANT
DURASEAL SPINE SEALANT 3ML (MISCELLANEOUS) ×3 IMPLANT
ELECT REM PT RETURN 9FT ADLT (ELECTROSURGICAL) ×3
ELECTRODE REM PT RTRN 9FT ADLT (ELECTROSURGICAL) ×1 IMPLANT
GAUZE SPONGE 4X4 12PLY STRL (GAUZE/BANDAGES/DRESSINGS) IMPLANT
GAUZE SPONGE 4X4 16PLY XRAY LF (GAUZE/BANDAGES/DRESSINGS) IMPLANT
GLOVE BIO SURGEON STRL SZ 6.5 (GLOVE) ×2 IMPLANT
GLOVE BIO SURGEON STRL SZ7 (GLOVE) IMPLANT
GLOVE BIO SURGEON STRL SZ7.5 (GLOVE) IMPLANT
GLOVE BIO SURGEON STRL SZ8 (GLOVE) IMPLANT
GLOVE BIO SURGEON STRL SZ8.5 (GLOVE) IMPLANT
GLOVE BIO SURGEONS STRL SZ 6.5 (GLOVE) ×1
GLOVE BIOGEL M 8.0 STRL (GLOVE) IMPLANT
GLOVE ECLIPSE 6.5 STRL STRAW (GLOVE) ×3 IMPLANT
GLOVE ECLIPSE 7.0 STRL STRAW (GLOVE) IMPLANT
GLOVE ECLIPSE 7.5 STRL STRAW (GLOVE) IMPLANT
GLOVE ECLIPSE 8.0 STRL XLNG CF (GLOVE) IMPLANT
GLOVE ECLIPSE 8.5 STRL (GLOVE) IMPLANT
GLOVE EXAM NITRILE LRG STRL (GLOVE) IMPLANT
GLOVE EXAM NITRILE MD LF STRL (GLOVE) IMPLANT
GLOVE EXAM NITRILE XL STR (GLOVE) IMPLANT
GLOVE EXAM NITRILE XS STR PU (GLOVE) IMPLANT
GLOVE INDICATOR 6.5 STRL GRN (GLOVE) ×3 IMPLANT
GLOVE INDICATOR 7.0 STRL GRN (GLOVE) IMPLANT
GLOVE INDICATOR 7.5 STRL GRN (GLOVE) IMPLANT
GLOVE INDICATOR 8.0 STRL GRN (GLOVE) IMPLANT
GLOVE INDICATOR 8.5 STRL (GLOVE) IMPLANT
GLOVE OPTIFIT SS 8.0 STRL (GLOVE) IMPLANT
GLOVE SURG SS PI 6.5 STRL IVOR (GLOVE) IMPLANT
GOWN STRL REUS W/ TWL LRG LVL3 (GOWN DISPOSABLE) ×2 IMPLANT
GOWN STRL REUS W/ TWL XL LVL3 (GOWN DISPOSABLE) IMPLANT
GOWN STRL REUS W/TWL 2XL LVL3 (GOWN DISPOSABLE) IMPLANT
GOWN STRL REUS W/TWL LRG LVL3 (GOWN DISPOSABLE) ×4
GOWN STRL REUS W/TWL XL LVL3 (GOWN DISPOSABLE)
GRAFT DURAGEN MATRIX 1WX1L (Tissue) ×3 IMPLANT
KIT BASIN OR (CUSTOM PROCEDURE TRAY) ×3 IMPLANT
KIT ROOM TURNOVER OR (KITS) ×3 IMPLANT
NS IRRIG 1000ML POUR BTL (IV SOLUTION) ×3 IMPLANT
PACK LAMINECTOMY NEURO (CUSTOM PROCEDURE TRAY) ×3 IMPLANT
PAD ARMBOARD 7.5X6 YLW CONV (MISCELLANEOUS) ×9 IMPLANT
SPONGE LAP 4X18 X RAY DECT (DISPOSABLE) IMPLANT
SPONGE SURGIFOAM ABS GEL SZ50 (HEMOSTASIS) ×3 IMPLANT
STRIP CLOSURE SKIN 1/2X4 (GAUZE/BANDAGES/DRESSINGS) IMPLANT
SUT ETHILON 3 0 FSL (SUTURE) ×3 IMPLANT
SUT PROLENE 5 0 CC1 (SUTURE) ×6 IMPLANT
SUT PROLENE 6 0 BV (SUTURE) ×3 IMPLANT
SUT VIC AB 0 CT1 18XCR BRD8 (SUTURE) ×1 IMPLANT
SUT VIC AB 0 CT1 8-18 (SUTURE) ×2
SUT VIC AB 2-0 CT1 18 (SUTURE) ×3 IMPLANT
SUT VIC AB 3-0 SH 8-18 (SUTURE) ×3 IMPLANT
SWAB COLLECTION DEVICE MRSA (MISCELLANEOUS) IMPLANT
TOWEL OR 17X24 6PK STRL BLUE (TOWEL DISPOSABLE) ×3 IMPLANT
TOWEL OR 17X26 10 PK STRL BLUE (TOWEL DISPOSABLE) ×3 IMPLANT
TUBE ANAEROBIC SPECIMEN COL (MISCELLANEOUS) IMPLANT
WATER STERILE IRR 1000ML POUR (IV SOLUTION) ×3 IMPLANT

## 2016-03-05 NOTE — Transfer of Care (Signed)
Immediate Anesthesia Transfer of Care Note  Patient: Jason Ballard  Procedure(s) Performed: Procedure(s) with comments: Exploration of lumbar wound, CSF Leak Repair  (N/A) - Exploration of lumbar wound  Patient Location: PACU  Anesthesia Type:General  Level of Consciousness: awake, alert , oriented, patient cooperative and responds to stimulation  Airway & Oxygen Therapy: Patient Spontanous Breathing and Patient connected to nasal cannula oxygen  Post-op Assessment: Report given to RN, Post -op Vital signs reviewed and stable and Patient moving all extremities X 4  Post vital signs: Reviewed and stable  Last Vitals:  Filed Vitals:   03/05/16 1343 03/05/16 1756  BP: 115/69 133/69  Pulse: 57 58  Temp: 36.7 C 37 C  Resp: 18 18    Last Pain:  Filed Vitals:   03/05/16 2103  PainSc: 8          Complications: No apparent anesthesia complications

## 2016-03-05 NOTE — Progress Notes (Signed)
Patients right posterior peripheral IV removed due to drainage and pain. Site clean, dry, no redness or swelling. RN will continue to monitor.

## 2016-03-05 NOTE — Op Note (Signed)
03/05/2016  11:30 PM  PATIENT:  Jason Ballard  50 y.o. male  PRE-OPERATIVE DIAGNOSIS:  CSF Leak, post op lumbar decompression L4-s1  POST-OPERATIVE DIAGNOSIS:  CSF Leak, post op lumbAr decompression L4- s1  PROCEDURE:  Procedure(s): Exploration of lumbar wound, Primary dural repair   SURGEON: Surgeon(s): Ashok Pall, MD  ASSISTANTS:none  ANESTHESIA:   general  EBL:  Total I/O In: 1000 [I.V.:1000] Out: 560 [Urine:550; Blood:10]  BLOOD ADMINISTERED:none  CELL SAVER GIVEN:none  COUNT:per nursing  DRAINS: none   SPECIMEN:  No Specimen  DICTATION: Jason Ballard was taken to the operating room, intubated, and placed under a general anesthetic without difficulty. A foley catheter was placed under sterile conditions. He was positioned prone on a Wilson frame with all pressure points padded. His back was prepped and draped in a sterile manner. I removed the sutures from the previous closure with an 11 blade and scissors. There was immediate emanation of clear fluid from the wound. I opened the thoracolumbar fascia and encountered more CSF. I inspected the previous dural repair and that was healed. I then identified a dural opening just at the superior edge of S1, which after inspection was releasing a great deal of spinal fluid. The opening was not a clear hole but rather a denuded and abraded area of dura. I used a 6-0 prolene suture to place a figure of 8 suture. I then used the microscope to inspect the closure and the rest of the dura exposed at the laminectomy site. I did not appreciate more spinal fluid leaking from the wound. I had the nurse anesthesist perform a valsalva and the repair remained intact without csf leaking. I placed duragen over the repair site, and the previous repair site, and over a thinned area of dura adjacent to dural rent. I placed bioglue over the duragen and repair site.  I closed the wound in layers approximating the thoracolumbar fascia, and subcutaneous  layers with vicryl suture. I approximated the skin edges with a running vertical mattress suture 3-0 nylon. I placed a sterile occlusive bandage to cover the closure. He was rolled supine on to a bed, and was extubated.  PLAN OF CARE: Admit to inpatient   PATIENT DISPOSITION:  PACU - hemodynamically stable.   Delay start of Pharmacological VTE agent (>24hrs) due to surgical blood loss or risk of bleeding:  yes

## 2016-03-05 NOTE — Anesthesia Preprocedure Evaluation (Addendum)
Anesthesia Evaluation  Patient identified by MRN, date of birth, ID band Patient awake    Reviewed: Allergy & Precautions, NPO status , Patient's Chart, lab work & pertinent test results  Airway Mallampati: II   Neck ROM: full    Dental  (+) Poor Dentition, Dental Advisory Given,    Pulmonary asthma , COPD, Current Smoker,    breath sounds clear to auscultation       Cardiovascular hypertension,  Rhythm:regular Rate:Normal     Neuro/Psych  Neuromuscular disease    GI/Hepatic   Endo/Other    Renal/GU      Musculoskeletal  (+) Arthritis ,   Abdominal   Peds  Hematology   Anesthesia Other Findings   Reproductive/Obstetrics                            Anesthesia Physical Anesthesia Plan  ASA: III  Anesthesia Plan: General   Post-op Pain Management:    Induction: Intravenous  Airway Management Planned: Oral ETT  Additional Equipment:   Intra-op Plan:   Post-operative Plan: Extubation in OR  Informed Consent: I have reviewed the patients History and Physical, chart, labs and discussed the procedure including the risks, benefits and alternatives for the proposed anesthesia with the patient or authorized representative who has indicated his/her understanding and acceptance.     Plan Discussed with: CRNA, Anesthesiologist and Surgeon  Anesthesia Plan Comments:         Anesthesia Quick Evaluation

## 2016-03-05 NOTE — Anesthesia Procedure Notes (Signed)
Procedure Name: Intubation Date/Time: 03/05/2016 10:09 PM Performed by: Suzy Bouchard Pre-anesthesia Checklist: Patient identified, Emergency Drugs available, Suction available, Timeout performed and Patient being monitored Patient Re-evaluated:Patient Re-evaluated prior to inductionOxygen Delivery Method: Circle system utilized Preoxygenation: Pre-oxygenation with 100% oxygen Intubation Type: IV induction Ventilation: Mask ventilation without difficulty Laryngoscope Size: Miller and 2 Grade View: Grade I Tube type: Oral Laser Tube: Cuffed inflated with minimal occlusive pressure - saline Tube size: 8.0 mm Number of attempts: 1 Airway Equipment and Method: Stylet Placement Confirmation: ETT inserted through vocal cords under direct vision,  positive ETCO2 and breath sounds checked- equal and bilateral Secured at: 22 cm Tube secured with: Tape Dental Injury: Teeth and Oropharynx as per pre-operative assessment

## 2016-03-05 NOTE — Procedures (Signed)
Lumbar myelogram performed with puncture at L3-4. 15 mL of Isovue-M 200 administered with free subarachnoid flow. 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 No immediate complication.

## 2016-03-05 NOTE — Progress Notes (Signed)
Pt arrived to 5C08 @ 1128, Pt A&Ox 4, c/o pain 8/10. Dressing  Saturated with CSF Pt without distress. Family at the bedside. Diet ordered, will monitor.

## 2016-03-05 NOTE — H&P (Signed)
Jason Ballard is an 50 y.o. male.   Chief Complaint: spinal fluid leak postoperative HPI: Jason Ballard is well known to our service. Jason Ballard underwent a lumbar laminectomy for decompression at L4/5,5/1. Post op Jason Ballard developed a spinal fluid leak. I returned to the OR on 6/10 found an opening in the dura and repaired it primarily. Jason Ballard was sent home and did ok for ~a week then returned with a csf leak. Jason Ballard was taken back to the OR, and was explored. A specific area could not be identified as causing the leak. Jason Ballard had a lumbar drain placed rostral to the wound and was sent home this past weekend. Jason Ballard called the office yesterday and reported spinal fluid leaking from the wound. I admitted Jason Ballard today for a myelogram. Unfortunately given the rapidity of the leak a single site could not be identified. I will take Jason Ballard to the operating room to explore the wound once again to find the point of egress.   Past Medical History  Diagnosis Date  . COPD (chronic obstructive pulmonary disease) ([place])   . Emphysema   . Pneumonia   . Asthma     Past Surgical History  Procedure Laterality Date  . Back surgery  2008    neck surgery  . Nerve replacement in right elbow, 2008    . Appendectomy    . Anterior cervical decomp/discectomy fusion N/A 05/03/2013    Procedure: ANTERIOR CERVICAL DECOMPRESSION/DISCECTOMY FUSION Cervical Three-Four;  Surgeon: Faythe Ghee, MD;  Location: [place] [facility];  Service: Neurosurgery;  Laterality: N/A;  . Video assisted thoracoscopy (vats)/empyema Right 12/19/2014    Procedure: VIDEO ASSISTED THORACOSCOPY/right thoracotomy (VATS)/DRAIN EMPYEMA;  Surgeon: Ivin Poot, MD;  Location: [facility] OR;  Service: Thoracic;  Laterality: Right;  . Lumbar laminectomy/decompression microdiscectomy N/A 02/05/2016    Procedure: Lumbar four- five Lumbar five-Sacral one Laminectomy with Left Side foraminotomy;  Surgeon: Kevan Ny Ditty, MD;  Location: [place] [facility];  Service: Neurosurgery;  Laterality: N/A;  L4-5  L5-S1 Laminectomy with Left Side foraminotomy  . Wound exploration N/A 02/07/2016    Procedure: Lumbar WOUND EXPLORATION, Primary Dura Repair for Spinal Fluid Leak;  Surgeon: Ashok Pall, MD;  Location: [place] [facility];  Service: Orthopedics;  Laterality: N/A;  . Lumbar wound debridement N/A 02/23/2016    Procedure: LUMBAR WOUND DEBRIDEMENT repair of CSF leak;  Surgeon: Kevan Ny Ditty, MD;  Location: [facility];  Service: Neurosurgery;  Laterality: N/A;    Family History  Problem Relation Age of Onset  . Diabetes Mellitus II Neg Hx   . Stroke Neg Hx   . Thyroid disease Mother    Social History:  reports that Jason Ballard has been smoking Cigarettes.  Jason Ballard has a 8.25 pack-year smoking history. Jason Ballard has never used smokeless tobacco. Jason Ballard reports that Jason Ballard drinks alcohol. Jason Ballard reports that Jason Ballard does not use illicit drugs.  Allergies:  Allergies  Allergen Reactions  . Levaquin [Levofloxacin In D5w] Nausea And Vomiting and Other (See Comments)    Upset stomach     Medications Prior to Admission  Medication Sig Dispense Refill  . acetaZOLAMIDE (DIAMOX) 250 MG tablet Take 250 mg by mouth 2 (two) times daily. For seven days    . Aclidinium Bromide 400 MCG/ACT AEPB Inhale 2 puffs into the lungs 2 (two) times daily. [place]    . albuterol (PROVENTIL HFA;VENTOLIN HFA) 108 (90 BASE) MCG/ACT inhaler Inhale 2 puffs into the lungs every 6 (six) hours as needed for wheezing.    Marland Kitchen alendronate (  FOSAMAX) 70 MG tablet Take 70 mg by mouth every Friday.     Marland Kitchen buPROPion (WELLBUTRIN XL) 150 MG 24 hr tablet Take 150 mg by mouth at bedtime.     . butalbital-acetaminophen-caffeine (FIORICET, ESGIC) 50-325-40 MG tablet Take 1 tablet by mouth daily as needed. headache    . cyclobenzaprine (FLEXERIL) 10 MG tablet Take 10 mg by mouth at bedtime as needed for muscle spasms.     Marland Kitchen dexamethasone (DECADRON) 4 MG tablet Take 4 mg by mouth 4 (four) times daily. Four day supply    . gabapentin (NEURONTIN) 300 MG capsule Take 1 capsule (300  mg total) by mouth 3 (three) times daily. 90 capsule 2  . meloxicam (MOBIC) 7.5 MG tablet Take 1 tablet (7.5 mg total) by mouth 2 (two) times daily. 60 tablet 2  . methocarbamol (ROBAXIN) 750 MG tablet Take 1 tablet (750 mg total) by mouth 4 (four) times daily. 120 tablet 2  . omeprazole (PRILOSEC) 40 MG capsule Take 40 mg by mouth daily.    . Oxycodone HCl 20 MG TABS Take 10-20 mg by mouth every 6 (six) hours as needed (For pain.).     Marland Kitchen pramipexole (MIRAPEX) 1 MG tablet Take 2 mg by mouth at bedtime.    . roflumilast (DALIRESP) 500 MCG TABS tablet Take 500 mcg by mouth daily.    . SYMBICORT 160-4.5 MCG/ACT inhaler Inhale 2 puffs into the lungs 2 (two) times daily.      No results found for this or any previous visit (from the past 48 hour(s)). Ct Lumbar Spine W Contrast  03/05/2016  CLINICAL DATA:  Lumbar spine surgery last month with persistent CSF leak. Low back pain and headaches. EXAM: LUMBAR MYELOGRAM FLUOROSCOPY TIME:  Radiation Exposure Index (as provided by the fluoroscopic device): 501.26 microGray*m^2 Fluoroscopy Time (in minutes and seconds):  1 minute PROCEDURE: After thorough discussion of risks and benefits of the procedure including bleeding, infection, injury to nerves, blood vessels, adjacent structures as well as headache and CSF leak, written and oral informed consent was obtained. Consent was obtained by Dr. Logan Bores. Time out form was completed. Patient was positioned prone on the fluoroscopy table. Local anesthesia was provided with 1% lidocaine without epinephrine after prepped and draped in the usual sterile fashion. Puncture was performed at L3-4 using a 3 1/2 inch 22-gauge spinal needle via a left interlaminar approach. Using a single pass through the dura, the needle was placed within the thecal sac, with return of clear CSF. 15 mL of Isovue M 200 was injected into the thecal sac, with normal opacification of the nerve roots and cauda equina consistent with free flow within  the subarachnoid space. I personally performed the lumbar puncture and administered the intrathecal contrast. I also personally supervised acquisition of the myelogram images. TECHNIQUE: Contiguous axial images were obtained through the Lumbar spine after the intrathecal infusion of infusion. Coronal and sagittal reconstructions were obtained of the axial image sets. COMPARISON:  Lumbar spine MRI 10/02/2015 FINDINGS: LUMBAR MYELOGRAM FINDINGS: Vertebral alignment is normal. There are shallow ventral extradural defects throughout the lumbar spine without significant spinal stenosis. An amorphous contrast collection quickly appeared centered at the L5 level during injection. No discrete source of the leak was identified during the conventional myelographic examination. CT LUMBAR MYELOGRAM FINDINGS: Vertebral alignment is unchanged from the prior MRI. Vertebral body heights are preserved. There is mild disc bulging from L1-2 to L5-S1 without associated spinal stenosis. There is mild neural foraminal narrowing bilaterally at  L3-4 and on the right at L4-5 with moderate neural foraminal narrowing on the left at L4-5, similar to the prior study. There is mild-to-moderate neural foraminal narrowing on the right at L5-S1 with mild residual narrowing on the left, improved from prior following interval foraminotomy. Laminectomies have been performed in the interim at L4-5 and L5-S1. There is a large fluid collection extending from the laminectomy sites posteriorly to the skin surface consistent with CSF leak and pseudomeningocele. This measures 6.6 x 5.1 x 8.8 cm (AP by transverse by craniocaudal). There is contrast diffusely throughout the lateral and dorsal epidural space extending from the L4-5 disc space level to the S1 level. No clear site of the leak is identified, with evaluation limited by the extensive opacification of the pseudomeningocele. There is no associated mass effect on the thecal sac. 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 No discrete leak site is identified. These results were called by telephone at the time of interpretation on 03/05/2016 at 2:43 pm to Dr. Ashok Pall , who verbally acknowledged these results. Electronically Signed   By: Logan Bores M.D.   On: 03/05/2016 14:46   Dg Myelography Lumbar Inj Lumbosacral  03/05/2016  CLINICAL DATA:  Lumbar spine surgery last month with persistent CSF leak. Low back pain and headaches. EXAM: LUMBAR MYELOGRAM FLUOROSCOPY TIME:  Radiation Exposure Index (as provided by the fluoroscopic device): 501.26 microGray*m^2 Fluoroscopy Time (in minutes and seconds):  1 minute PROCEDURE: After thorough discussion of risks and benefits of the procedure including bleeding, infection, injury to nerves, blood vessels, adjacent structures as well as headache and CSF leak, written and oral informed consent was obtained. Consent was obtained by Dr. Logan Bores. Time out form was completed. Patient was positioned prone on the fluoroscopy table. Local anesthesia was provided with 1% lidocaine without epinephrine after prepped and draped in the usual sterile fashion. Puncture was performed at L3-4 using a 3 1/2 inch 22-gauge spinal needle via a left interlaminar approach. Using a single pass through the dura, the needle was placed within the thecal sac, with return of clear CSF. 15 mL of Isovue M 200 was injected into the thecal sac, with normal opacification of the nerve roots and cauda equina consistent with free flow within the subarachnoid space. I personally performed the lumbar puncture and administered the intrathecal contrast. I also personally supervised acquisition of the myelogram images. TECHNIQUE: Contiguous axial images were obtained through the Lumbar spine after the intrathecal infusion of infusion. Coronal and sagittal reconstructions were obtained of the axial image sets. COMPARISON:  Lumbar spine MRI 10/02/2015 FINDINGS: LUMBAR MYELOGRAM  FINDINGS: Vertebral alignment is normal. There are shallow ventral extradural defects throughout the lumbar spine without significant spinal stenosis. An amorphous contrast collection quickly appeared centered at the L5 level during injection. No discrete source of the leak was identified during the conventional myelographic examination. CT LUMBAR MYELOGRAM FINDINGS: Vertebral alignment is unchanged from the prior MRI. Vertebral body heights are preserved. There is mild disc bulging from L1-2 to L5-S1 without associated spinal stenosis. There is mild neural foraminal narrowing bilaterally at L3-4 and on the right at L4-5 with moderate neural foraminal narrowing on the left at L4-5, similar to the prior study. There is mild-to-moderate neural foraminal narrowing on the right at L5-S1 with mild residual narrowing on the left, improved from prior following interval foraminotomy. Laminectomies have been performed in the interim at L4-5 and L5-S1. There is a large fluid collection extending from the laminectomy sites  posteriorly to the skin surface consistent with CSF leak and pseudomeningocele. This measures 6.6 x 5.1 x 8.8 cm (AP by transverse by craniocaudal). There is contrast diffusely throughout the lateral and dorsal epidural space extending from the L4-5 disc space level to the S1 level. No clear site of the leak is identified, with evaluation limited by the extensive opacification of the pseudomeningocele. There is no associated mass effect on the thecal sac. 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 No discrete leak site is identified. These results were called by telephone at the time of interpretation on 03/05/2016 at 2:43 pm to Dr. Ashok Pall , who verbally acknowledged these results. Electronically Signed   By: Logan Bores M.D.   On: 03/05/2016 14:46    Review of Systems  Constitutional: Negative.   Eyes: Negative.   Respiratory: Negative.   Cardiovascular: Negative.    Gastrointestinal: Negative.   Genitourinary: Negative.   Neurological: Positive for headaches.  Endo/Heme/Allergies: Negative.   Psychiatric/Behavioral: Negative.     Blood pressure 115/69, pulse 57, temperature 98.1 F (36.7 C), temperature source Oral, resp. rate 18, SpO2 100 %. Physical Exam  Constitutional: Jason Ballard is oriented to person, place, and time. Jason Ballard appears well-developed and well-nourished. Jason Ballard appears distressed.  HENT:  Head: Normocephalic and atraumatic.  Eyes: Conjunctivae and EOM are normal. Pupils are equal, round, and reactive to light.  Neck: Normal range of motion. Neck supple.  Cardiovascular: Normal rate, regular rhythm, normal heart sounds and intact distal pulses.   Respiratory: Effort normal and breath sounds normal.  GI: Soft. Bowel sounds are normal.  Musculoskeletal: Normal range of motion.  Neurological: Jason Ballard is alert and oriented to person, place, and time. Jason Ballard has normal reflexes. Jason Ballard displays normal reflexes. No cranial nerve deficit. Jason Ballard exhibits normal muscle tone. Coordination normal.  Skin: Skin is warm and dry.  Wound with fluctuant pocket at inferior portion of the lumbar incision  Psychiatric: Jason Ballard has a normal mood and affect. His behavior is normal. Judgment and thought content normal.     Assessment/Plan OR for wound exploration and dural repair.   Jairus Tonne L, MD 03/05/2016, 5:21 PM

## 2016-03-06 MED ORDER — ONDANSETRON HCL 4 MG/2ML IJ SOLN: 4.0000 mg | INTRAMUSCULAR | Status: DC | PRN

## 2016-03-06 MED ORDER — BISACODYL 5 MG PO TBEC: 5.0000 mg | DELAYED_RELEASE_TABLET | Freq: Every day | ORAL | Status: DC | PRN

## 2016-03-06 MED ORDER — BUPROPION HCL ER (XL) 150 MG PO TB24
150.0000 mg | ORAL_TABLET | Freq: Every day | ORAL | Status: DC
  Administered 2016-03-06 – 2016-03-16 (×11): 150 mg via ORAL
  Filled 2016-03-06 (×11): qty 1

## 2016-03-06 MED ORDER — METHOCARBAMOL 750 MG PO TABS
750.0000 mg | ORAL_TABLET | Freq: Three times a day (TID) | ORAL | Status: DC
  Administered 2016-03-06 – 2016-03-17 (×45): 750 mg via ORAL
  Filled 2016-03-06: qty 1
  Filled 2016-03-06: qty 2
  Filled 2016-03-06 (×6): qty 1
  Filled 2016-03-06: qty 1.5
  Filled 2016-03-06: qty 1
  Filled 2016-03-06: qty 2
  Filled 2016-03-06: qty 1.5
  Filled 2016-03-06 (×7): qty 1
  Filled 2016-03-06 (×2): qty 1.5
  Filled 2016-03-06 (×3): qty 2
  Filled 2016-03-06 (×3): qty 1
  Filled 2016-03-06: qty 2
  Filled 2016-03-06: qty 1.5
  Filled 2016-03-06: qty 2
  Filled 2016-03-06: qty 1
  Filled 2016-03-06 (×2): qty 2
  Filled 2016-03-06 (×2): qty 1
  Filled 2016-03-06: qty 2
  Filled 2016-03-06 (×10): qty 1
  Filled 2016-03-06: qty 2
  Filled 2016-03-06 (×3): qty 1

## 2016-03-06 MED ORDER — ROFLUMILAST 500 MCG PO TABS
500.0000 ug | ORAL_TABLET | Freq: Every day | ORAL | Status: DC
  Administered 2016-03-06 – 2016-03-17 (×12): 500 ug via ORAL
  Filled 2016-03-06 (×12): qty 1

## 2016-03-06 MED ORDER — BUTALBITAL-APAP-CAFFEINE 50-325-40 MG PO TABS
1.0000 | ORAL_TABLET | Freq: Four times a day (QID) | ORAL | Status: DC | PRN
  Administered 2016-03-06 – 2016-03-10 (×2): 1 via ORAL
  Filled 2016-03-06 (×2): qty 1

## 2016-03-06 MED ORDER — SENNOSIDES-DOCUSATE SODIUM 8.6-50 MG PO TABS: 1.0000 | ORAL_TABLET | Freq: Every evening | ORAL | Status: DC | PRN

## 2016-03-06 MED ORDER — ALBUTEROL SULFATE (2.5 MG/3ML) 0.083% IN NEBU
2.5000 mg | INHALATION_SOLUTION | Freq: Four times a day (QID) | RESPIRATORY_TRACT | Status: DC | PRN
Start: 2016-03-06 — End: 2016-03-17

## 2016-03-06 MED ORDER — POTASSIUM CHLORIDE IN NACL 20-0.9 MEQ/L-% IV SOLN
INTRAVENOUS | Status: DC
Start: 2016-03-06 — End: 2016-03-17
  Administered 2016-03-06 – 2016-03-11 (×2): via INTRAVENOUS
  Filled 2016-03-06 (×24): qty 1000

## 2016-03-06 MED ORDER — PHENOL 1.4 % MT LIQD: 1.0000 | OROMUCOSAL | Status: DC | PRN

## 2016-03-06 MED ORDER — MAGNESIUM CITRATE PO SOLN
1.0000 | Freq: Once | ORAL | Status: DC | PRN
Start: 2016-03-06 — End: 2016-03-17

## 2016-03-06 MED ORDER — ACETAMINOPHEN 325 MG PO TABS
650.0000 mg | ORAL_TABLET | ORAL | Status: DC | PRN
  Filled 2016-03-06: qty 2

## 2016-03-06 MED ORDER — OXYCODONE HCL 5 MG PO TABS
10.0000 mg | ORAL_TABLET | Freq: Four times a day (QID) | ORAL | Status: DC | PRN
  Administered 2016-03-06 – 2016-03-07 (×5): 20 mg via ORAL
  Administered 2016-03-08: 10 mg via ORAL
  Administered 2016-03-08: 20 mg via ORAL
  Administered 2016-03-08: 10 mg via ORAL
  Administered 2016-03-08 – 2016-03-17 (×32): 20 mg via ORAL
  Filled 2016-03-06 (×40): qty 4
  Filled 2016-03-06: qty 2

## 2016-03-06 MED ORDER — KETOROLAC TROMETHAMINE 30 MG/ML IJ SOLN
30.0000 mg | Freq: Four times a day (QID) | INTRAMUSCULAR | Status: DC
  Administered 2016-03-06 – 2016-03-09 (×15): 30 mg via INTRAVENOUS
  Filled 2016-03-06 (×15): qty 1

## 2016-03-06 MED ORDER — SENNA 8.6 MG PO TABS
1.0000 | ORAL_TABLET | Freq: Two times a day (BID) | ORAL | Status: DC
  Administered 2016-03-06 – 2016-03-11 (×7): 8.6 mg via ORAL
  Filled 2016-03-06 (×10): qty 1

## 2016-03-06 MED ORDER — PRAMIPEXOLE DIHYDROCHLORIDE 1 MG PO TABS
2.0000 mg | ORAL_TABLET | Freq: Every day | ORAL | Status: DC
  Administered 2016-03-06 – 2016-03-16 (×11): 2 mg via ORAL
  Filled 2016-03-06 (×15): qty 2

## 2016-03-06 MED ORDER — ACETAMINOPHEN 650 MG RE SUPP: 650.0000 mg | RECTAL | Status: DC | PRN

## 2016-03-06 MED ORDER — GABAPENTIN 300 MG PO CAPS
300.0000 mg | ORAL_CAPSULE | Freq: Three times a day (TID) | ORAL | Status: DC
  Administered 2016-03-06 – 2016-03-17 (×34): 300 mg via ORAL
  Filled 2016-03-06 (×34): qty 1

## 2016-03-06 MED ORDER — MENTHOL 3 MG MT LOZG: 1.0000 | LOZENGE | OROMUCOSAL | Status: DC | PRN

## 2016-03-06 MED ORDER — PANTOPRAZOLE SODIUM 40 MG PO TBEC
40.0000 mg | DELAYED_RELEASE_TABLET | Freq: Every day | ORAL | Status: DC
  Administered 2016-03-06 – 2016-03-17 (×12): 40 mg via ORAL
  Filled 2016-03-06 (×12): qty 1

## 2016-03-06 MED ORDER — MOMETASONE FURO-FORMOTEROL FUM 200-5 MCG/ACT IN AERO
2.0000 | INHALATION_SPRAY | Freq: Two times a day (BID) | RESPIRATORY_TRACT | Status: DC
  Filled 2016-03-06 (×2): qty 8.8

## 2016-03-06 MED ORDER — SODIUM CHLORIDE 0.9 % IV SOLN
250.0000 mL | INTRAVENOUS | Status: DC
Start: 2016-03-06 — End: 2016-03-17

## 2016-03-06 MED ORDER — DEXAMETHASONE 4 MG PO TABS
4.0000 mg | ORAL_TABLET | Freq: Three times a day (TID) | ORAL | Status: DC
  Administered 2016-03-06 – 2016-03-09 (×15): 4 mg via ORAL
  Filled 2016-03-06 (×15): qty 1

## 2016-03-06 MED ORDER — SODIUM CHLORIDE 0.9% FLUSH: 3.0000 mL | INTRAVENOUS | Status: DC | PRN

## 2016-03-06 MED ORDER — TIOTROPIUM BROMIDE MONOHYDRATE 18 MCG IN CAPS
18.0000 ug | ORAL_CAPSULE | Freq: Every day | RESPIRATORY_TRACT | Status: DC
  Filled 2016-03-06: qty 5

## 2016-03-06 MED ORDER — SODIUM CHLORIDE 0.9% FLUSH
3.0000 mL | Freq: Two times a day (BID) | INTRAVENOUS | Status: DC
Start: 2016-03-06 — End: 2016-03-17
  Administered 2016-03-06 – 2016-03-17 (×20): 3 mL via INTRAVENOUS

## 2016-03-06 NOTE — Progress Notes (Signed)
Patient ID: Jason Ballard, male   DOB: August 05, 1966, 49 y.o.   MRN: [id number] Found him semi sitting. No headache . No drainage. No weakness. Continue bed rest

## 2016-03-07 NOTE — Progress Notes (Signed)
RN discussed patients treatment and d/c plan. Patient v/u and agreed to wean off of IV pain medications in order to see if pain can be managed by PO analgesic. 2 mg morphine administered to patient to help decrease pain until scheduled PO medication may be given. Patient calm, cooperative, comfortable and in good spirits. RN will continue to monitor.

## 2016-03-07 NOTE — Anesthesia Postprocedure Evaluation (Signed)
Anesthesia Post Note  Patient: Jason Ballard  Procedure(s) Performed: Procedure(s) (LRB): Exploration of lumbar wound, CSF Leak Repair  (N/A)  Patient location during evaluation: PACU Anesthesia Type: General Level of consciousness: awake and alert and patient cooperative Pain management: pain level controlled Vital Signs Assessment: post-procedure vital signs reviewed and stable Respiratory status: spontaneous breathing and respiratory function stable Cardiovascular status: stable Anesthetic complications: no    Last Vitals:  Filed Vitals:   03/07/16 0203 03/07/16 0616  BP: 95/55 110/72  Pulse: 57 75  Temp: 36.9 C 36.6 C  Resp: 18 18    Last Pain:  Filed Vitals:   03/07/16 0617  PainSc: [place]

## 2016-03-07 NOTE — Progress Notes (Signed)
Patient ID: Jason Ballard, male   DOB: August 04, 1966, 50 y.o.   MRN: [id number] Overall doing well. No headache. Moves lower extremities well. Dressing clean dry and flat.

## 2016-03-08 ENCOUNTER — Encounter (HOSPITAL_COMMUNITY): Payer: Self-pay | Admitting: Neurosurgery

## 2016-03-08 NOTE — Care Management Note (Signed)
Case Management Note  Patient Details  Name: Jason Ballard MRN: [id number] Date of Birth: 03/27/1966  Subjective/Objective:    Pt in with CSF leak and is s/p repair of leak. Currently he remains on bedrest. He is from home with his spouse.               Action/Plan: Awaiting removal of bedrest. CM following for discharge needs.   Expected Discharge Date:  03/08/16               Expected Discharge Plan:     In-House Referral:     Discharge planning Services     Post Acute Care Choice:    Choice offered to:     DME Arranged:    DME Agency:     HH Arranged:    HH Agency:     Status of Service:  In process, will continue to follow  If discussed at Long Length of Stay Meetings, dates discussed:    Additional Comments:  Pollie Friar, RN 03/08/2016, 10:34 AM

## 2016-03-08 NOTE — Care Management Important Message (Signed)
Important Message  Patient Details  Name: Jason Ballard MRN: [id number] Date of Birth: 04/07/1966   Medicare Important Message Given:  Yes    Loann Quill 03/08/2016, 8:59 AM

## 2016-03-08 NOTE — Progress Notes (Signed)
Patient ID: Jason Ballard, male   DOB: 11-08-1965, 50 y.o.   MRN: [id number] BP 128/71 mmHg  Pulse 67  Temp(Src) 98.2 F (36.8 C) (Oral)  Resp 18  Ht 5\' 8"  (1.727 m)  Wt 71.759 kg (158 lb 3.2 oz)  BMI 24.06 kg/m2  SpO2 99% Alert and oriented x 4, wound is flat and dry. Moving all extremities well.  Will allow to be up tonight, plan on discharge tomorrow.

## 2016-03-09 MED ORDER — MORPHINE SULFATE (PF) 4 MG/ML IV SOLN
5.0000 mg | Freq: Once | INTRAVENOUS | Status: AC
Start: 2016-03-09 — End: 2016-03-09

## 2016-03-09 MED ORDER — MIDAZOLAM HCL 2 MG/2ML IJ SOLN
4.0000 mg | Freq: Once | INTRAMUSCULAR | Status: DC
  Administered 2016-03-09: 4 mg via INTRAVENOUS

## 2016-03-09 MED ORDER — LIDOCAINE HCL (PF) 1 % IJ SOLN
INTRAMUSCULAR | Status: AC
Start: 2016-03-09 — End: 2016-03-09
  Administered 2016-03-09: 10:00:00
  Filled 2016-03-09: qty 5

## 2016-03-09 MED ORDER — MORPHINE SULFATE (PF) 4 MG/ML IV SOLN
4.0000 mg | Freq: Once | INTRAVENOUS | Status: AC
  Administered 2016-03-09: 4 mg via INTRAVENOUS

## 2016-03-09 MED ORDER — MIDAZOLAM HCL 2 MG/2ML IJ SOLN
INTRAMUSCULAR | Status: AC
  Administered 2016-03-09: 4 mg via INTRAVENOUS
  Filled 2016-03-09: qty 4

## 2016-03-09 MED ORDER — MIDAZOLAM HCL 5 MG/ML IJ SOLN: 4.0000 mg | Freq: Once | INTRAMUSCULAR | Status: DC

## 2016-03-09 MED ORDER — MIDAZOLAM HCL 2 MG/2ML IJ SOLN
4.0000 mg | Freq: Once | INTRAMUSCULAR | Status: AC
  Filled 2016-03-09: qty 4

## 2016-03-09 MED ORDER — MIDAZOLAM HCL 2 MG/2ML IJ SOLN
4.0000 mg | Freq: Once | INTRAMUSCULAR | Status: AC
  Administered 2016-03-09: 4 mg via INTRAVENOUS

## 2016-03-09 MED ORDER — LIDOCAINE HCL (CARDIAC) 20 MG/ML IV SOLN
INTRAVENOUS | Status: AC
  Filled 2016-03-09: qty 5

## 2016-03-09 NOTE — Progress Notes (Signed)
Pt being transferred to 3M10 (ICU) per orders from MD due to incision site drainage. Pt and family made aware of transfer. Pt and family verbalized understanding of transfer. Pt's IV maintained for new unit. RN called and gave report to Margaretha Sheffield, Therapist, sports. Pt transferred.

## 2016-03-09 NOTE — Progress Notes (Signed)
Patient ID: Jason Ballard, male   DOB: 11-17-1965, 50 y.o.   MRN: [id number] BP 118/71 mmHg  Pulse 60  Temp(Src) 98.3 F (36.8 C) (Oral)  Resp 21  Ht 5\' 8"  (1.727 m)  Wt 71.759 kg (158 lb 3.2 oz)  BMI 24.06 kg/m2  SpO2 98% Alert and oriented x 4, speech is clear and fluent Moving all extremities well Dressing was saturated this am, I did place sutures where the leaking was visible however this did not stop the leak. I recommended that we place a lumbar drain, and leave him flat. He agreed and I placed the lumbar drain this evening under sterile conditions with good csf flow. I will leave him drained and flat at least until Sunday. May possibly clamp on Monday, with him still flat. Will not get him up until Tuesday.

## 2016-03-09 NOTE — Progress Notes (Signed)
4mg  versed and 5mg  morphine given pre op for lumbar drain placement.

## 2016-03-10 MED ORDER — SODIUM CHLORIDE 0.9 % IV SOLN
1250.0000 mg | Freq: Two times a day (BID) | INTRAVENOUS | Status: DC
  Administered 2016-03-10 – 2016-03-13 (×8): 1250 mg via INTRAVENOUS
  Filled 2016-03-10 (×10): qty 1250

## 2016-03-10 MED ORDER — ACLIDINIUM BROMIDE 400 MCG/ACT IN AEPB
2.0000 | INHALATION_SPRAY | Freq: Two times a day (BID) | RESPIRATORY_TRACT | Status: DC
  Administered 2016-03-11 – 2016-03-16 (×10): 2 via RESPIRATORY_TRACT
  Filled 2016-03-10: qty 1

## 2016-03-10 MED ORDER — BUDESONIDE-FORMOTEROL FUMARATE 160-4.5 MCG/ACT IN AERO
2.0000 | INHALATION_SPRAY | RESPIRATORY_TRACT | Status: DC
  Administered 2016-03-11: 2 via RESPIRATORY_TRACT

## 2016-03-10 MED ORDER — DEXTROSE 5 % IV SOLN
2.0000 g | Freq: Two times a day (BID) | INTRAVENOUS | Status: DC
  Administered 2016-03-10 – 2016-03-16 (×14): 2 g via INTRAVENOUS
  Filled 2016-03-10 (×15): qty 2

## 2016-03-10 NOTE — Progress Notes (Signed)
At 0400 noticed patients bed pad saturated with a large amount of CSF. Noticed that CSF was leaking around the lumbar drain insertion site. Drain has been draining 20cc/hr per order and patients HOB has been flat.. Reinforced the tegaderm with an ABD pad and kerlix. Notified Dr Cyndy Freeze who ordered to start vanc and rocephin BID.  Will continue to monitor.  Wister Hoefle, [place] Marie, RN 03/10/2016 4:49 AM

## 2016-03-10 NOTE — Progress Notes (Signed)
   03/10/16 1227  Clinical Encounter Type  Visited With Patient;Health care provider  Visit Type Initial;Spiritual support;Post-op  Spiritual Encounters  Spiritual Needs Prayer  Stress Factors  Patient Stress Factors Health changes   Chaplain checked in with patient as patient has been through multiple hospitalizations and procedures for his back issues. Chaplain introduced spiritual care services. Chaplain offered prayer and support. Spiritual care services available as needed.   Jeri Lager, Chaplain 03/10/2016 12:28 PM

## 2016-03-10 NOTE — Progress Notes (Signed)
Pharmacy Antibiotic Note  Jason Ballard is a 50 y.o. male admitted on 03/05/2016 with recurrent spinal fluid leaks.  Pharmacy has been consulted for Vancomycin dosing. Pt also started on Rocephin.  Pt with vancomycin trough 15 mcg/ml on 1250mg  IV q12h during last admission  Plan: Vancomycin 1250mg  IV q12h (goal trough 15-20 mcg/ml) Change Rocephin to 2gm IV q12h for CSF coverage Will f/u micro data, renal function, and pt's clinical condition Vanc trough prn  Height: 5\' 8"  (172.7 cm) Weight: 158 lb 3.2 oz (71.759 kg) IBW/kg (Calculated) : 68.4  Temp (24hrs), Avg:98.1 F (36.7 C), Min:97.9 F (36.6 C), Max:98.3 F (36.8 C)  No results for input(s): WBC, CREATININE, LATICACIDVEN, VANCOTROUGH, VANCOPEAK, VANCORANDOM, GENTTROUGH, GENTPEAK, GENTRANDOM, TOBRATROUGH, TOBRAPEAK, TOBRARND, AMIKACINPEAK, AMIKACINTROU, AMIKACIN in the last 168 hours.  Estimated Creatinine Clearance: 98.3 mL/min (by C-G formula based on Cr of 0.87).    Allergies  Allergen Reactions  . Levaquin [Levofloxacin In D5w] Nausea And Vomiting and Other (See Comments)    Upset stomach     Antimicrobials this admission: 7/12 Vanc >>  7/12 Rocephin >>   Dose adjustments this admission: n/a  Microbiology results:  Thank you for allowing pharmacy to be a part of this patient's care.  Sherlon Handing, PharmD, BCPS Clinical pharmacist, pager [phone] 03/10/2016 4:59 AM

## 2016-03-10 NOTE — Care Management Note (Addendum)
Case Management Note  Patient Details  Name: Jason Ballard MRN: [id number] Date of Birth: 12-14-1965  Subjective/Objective:    Pt admitted on 03/05/16 with recurrent CSF leak.  PTA, pt independent of ADLS; lives with spouse.                  Action/Plan: Will follow for discharge planning as pt progresses.    Expected Discharge Date:           Expected Discharge Plan:  Home/Self Care  In-House Referral:     Discharge planning Services  CM Consult  Post Acute Care Choice:    Choice offered to:     DME Arranged:    DME Agency:     HH Arranged:    HH Agency:     Status of Service:   in process, will continue to follow  If discussed at Long Length of Stay Meetings, dates discussed:    Additional Comments:  Reinaldo Raddle, RN, BSN  Trauma/Neuro ICU Case Manager [phone]

## 2016-03-10 NOTE — Progress Notes (Signed)
Patient ID: Jason Ballard, male   DOB: 1965-10-23, 50 y.o.   MRN: [id number] BP 120/65 mmHg  Pulse 75  Temp(Src) 97.7 F (36.5 C) (Oral)  Resp 17  Ht 5\' 8"  (1.727 m)  Wt 71.759 kg (158 lb 3.2 oz)  BMI 24.06 kg/m2  SpO2 97% Alert and oriented x 4,speech is clear and fluent Moving all her extremities well No fluid coming from wound.

## 2016-03-11 LAB — CREATININE, SERUM: Creatinine, Ser: 0.78 mg/dL (ref 0.61–1.24)

## 2016-03-11 MED ORDER — MORPHINE SULFATE (PF) 4 MG/ML IV SOLN
4.0000 mg | INTRAVENOUS | Status: DC | PRN
  Administered 2016-03-11 – 2016-03-17 (×37): 4 mg via INTRAVENOUS
  Filled 2016-03-11 (×37): qty 1

## 2016-03-11 MED ORDER — BUDESONIDE-FORMOTEROL FUMARATE 160-4.5 MCG/ACT IN AERO
2.0000 | INHALATION_SPRAY | Freq: Two times a day (BID) | RESPIRATORY_TRACT | Status: DC
  Administered 2016-03-11 – 2016-03-17 (×11): 2 via RESPIRATORY_TRACT

## 2016-03-11 MED ORDER — MOMETASONE FURO-FORMOTEROL FUM 200-5 MCG/ACT IN AERO
2.0000 | INHALATION_SPRAY | Freq: Two times a day (BID) | RESPIRATORY_TRACT | Status: DC
  Filled 2016-03-11: qty 8.8

## 2016-03-11 MED ORDER — BUDESONIDE-FORMOTEROL FUMARATE 160-4.5 MCG/ACT IN AERO: 2.0000 | INHALATION_SPRAY | Freq: Two times a day (BID) | RESPIRATORY_TRACT | Status: DC

## 2016-03-11 NOTE — Care Management Important Message (Signed)
Important Message  Patient Details  Name: Jason Ballard MRN: [id number] Date of Birth: 01-13-1966   Medicare Important Message Given:  Yes    Nathen May 03/11/2016, 1:33 PM

## 2016-03-11 NOTE — Progress Notes (Signed)
Patient ID: Jason Ballard, male   DOB: 05-23-1966, 50 y.o.   MRN: [id number]. BP 104/80 mmHg  Pulse 65  Temp(Src) 97.8 F (36.6 C) (Oral)  Resp 14  Ht 5\' 8"  (1.727 m)  Wt 71.759 kg (158 lb 3.2 oz)  BMI 24.06 kg/m2  SpO2 92% Alert and oriented x 4 Wound is dry, lumbar drain is working. To be drained

## 2016-03-12 LAB — BASIC METABOLIC PANEL
ANION GAP: 10 (ref 5–15)
BUN: 15 mg/dL (ref 6–20)
CALCIUM: 8.6 mg/dL — AB (ref 8.9–10.3)
CO2: 26 mmol/L (ref 22–32)
Chloride: 93 mmol/L — ABNORMAL LOW (ref 101–111)
Creatinine, Ser: 0.71 mg/dL (ref 0.61–1.24)
GFR calc Af Amer: >60 mL/min (ref >60)
GLUCOSE: 89 mg/dL (ref 65–99)
Potassium: 4.4 mmol/L (ref 3.5–5.1)
SODIUM: 129 mmol/L — AB (ref 135–145)

## 2016-03-12 LAB — CBC
HCT: 45.7 % (ref 39.0–52.0)
Hemoglobin: 15.4 g/dL (ref 13.0–17.0)
MCH: 29.7 pg (ref 26.0–34.0)
MCHC: 33.7 g/dL (ref 30.0–36.0)
MCV: 88.2 fL (ref 78.0–100.0)
Platelets: 280 10*3/uL (ref 150–400)
RBC: 5.18 MIL/uL (ref 4.22–5.81)
RDW: 12.8 % (ref 11.5–15.5)
WBC: 19.3 10*3/uL — ABNORMAL HIGH (ref 4.0–10.5)

## 2016-03-13 LAB — VANCOMYCIN, TROUGH: VANCOMYCIN TR: 12 ug/mL — AB (ref 15–20)

## 2016-03-13 MED ORDER — VANCOMYCIN HCL 10 G IV SOLR
1500.0000 mg | Freq: Two times a day (BID) | INTRAVENOUS | Status: DC
  Administered 2016-03-14 – 2016-03-16 (×6): 1500 mg via INTRAVENOUS
  Filled 2016-03-13 (×7): qty 1500

## 2016-03-13 NOTE — Progress Notes (Signed)
Pharmacy Antibiotic Note  Jason Ballard is a 50 y.o. male admitted on 03/05/2016 with recurrent spinal fluid leaks.  Pharmacy has been consulted for Vancomycin dosing. Pt also started on Rocephin. A vancomycin trough today is 12 which is slightly below goal.   Plan: Increase Vancomycin to 1500mg  IV q12h (goal trough 15-20 mcg/ml) Will f/u micro data, renal function, and pt's clinical condition Vanc trough prn  Height: 5\' 8"  (172.7 cm) Weight: 158 lb 3.2 oz (71.759 kg) IBW/kg (Calculated) : 68.4  Temp (24hrs), Avg:98.1 F (36.7 C), Min:97.7 F (36.5 C), Max:98.8 F (37.1 C)   Recent Labs Lab 03/11/16 0618 03/12/16 0844 03/13/16 1741  WBC  --  19.3*  --   CREATININE 0.78 0.71  --   VANCOTROUGH  --   --  12*    Estimated Creatinine Clearance: 106.9 mL/min (by C-G formula based on Cr of 0.71).    Allergies  Allergen Reactions  . Levaquin [Levofloxacin In D5w] Nausea And Vomiting and Other (See Comments)    Upset stomach     Antimicrobials this admission: 7/12 Vanc >>  7/12 Rocephin >>   Dose adjustments this admission: 7/15 VT = 12, changed from 1250mg  Q12H to 1500mg  Q12H  Microbiology results:  Thank you for allowing pharmacy to be a part of this patient's care.  Salome Arnt, PharmD, BCPS Pager # [phone] 03/13/2016 6:33 PM

## 2016-03-13 NOTE — Progress Notes (Signed)
Overall stable. Tolerating lumbar drain. Drain working well without issues. Wound dry.  Continue lumbar drainage for treatment of CSF leak.

## 2016-03-14 NOTE — Progress Notes (Signed)
Patient ID: Jason Ballard, male   DOB: [birth year], 50 y.o.   MRN: [id number] BP 112/76 mmHg  Pulse 75  Temp(Src) 98.3 F (36.8 C) (Oral)  Resp 19  Ht 5\' 8"  (1.727 m)  Wt 71.759 kg (158 lb 3.2 oz)  BMI 24.06 kg/m2  SpO2 94% Alert and oriented x 4, speech is clear and fluent Moving all extremities well Wound is clean, dry. No signs of infection. Will clamp tomorrow.

## 2016-03-15 LAB — BASIC METABOLIC PANEL
ANION GAP: 4 — AB (ref 5–15)
BUN: 12 mg/dL (ref 6–20)
CALCIUM: 8.4 mg/dL — AB (ref 8.9–10.3)
CO2: 29 mmol/L (ref 22–32)
CREATININE: 0.72 mg/dL (ref 0.61–1.24)
Chloride: 101 mmol/L (ref 101–111)
Glucose, Bld: 96 mg/dL (ref 65–99)
Potassium: 3.7 mmol/L (ref 3.5–5.1)
Sodium: 134 mmol/L — ABNORMAL LOW (ref 135–145)

## 2016-03-15 NOTE — Care Management Important Message (Signed)
Important Message  Patient Details  Name: Jason Ballard MRN: [id number] Date of Birth: 1966-03-19   Medicare Important Message Given:  Yes    Nathen May 03/15/2016, 3:09 PM

## 2016-03-15 NOTE — Progress Notes (Signed)
Patient ID: Jason Ballard, male   DOB: 1966/03/16, 50 y.o.   MRN: [id number] BP 124/69 mmHg  Pulse 92  Temp(Src) 98.6 F (37 C) (Oral)  Resp 18  Ht 5\' 8"  (1.727 m)  Wt 71.759 kg (158 lb 3.2 oz)  BMI 24.06 kg/m2  SpO2 95% Alert and oriented x 4 Lumbar drain now clamped. Will observe wound closely.  Neurologically normal.  Drain site is clean,and dry.

## 2016-03-16 MED ORDER — ACLIDINIUM BROMIDE 400 MCG/ACT IN AEPB
1.0000 | INHALATION_SPRAY | Freq: Two times a day (BID) | RESPIRATORY_TRACT | Status: DC
  Administered 2016-03-16 – 2016-03-17 (×2): 1 via RESPIRATORY_TRACT

## 2016-03-16 NOTE — Progress Notes (Signed)
Pharmacy Antibiotic Note  Jason Ballard is a 50 y.o. male admitted on 03/05/2016 with recurrent spinal fluid leaks.  Pharmacy has been consulted for Vancomycin dosing. Pt is also on ceftriaxone. Pt remains afebrile and kidney function appears stable.   Plan: - Continue Vancomycin 1500mg  IV q12h (goal trough 15-20 mcg/ml) - Will f/u micro data, renal function, and pt's clinical condition - Vanc trough prn  Height: 5\' 8"  (172.7 cm) Weight: 158 lb 3.2 oz (71.759 kg) IBW/kg (Calculated) : 68.4  Temp (24hrs), Avg:98.2 F (36.8 C), Min:97.9 F (36.6 C), Max:98.6 F (37 C)   Recent Labs Lab 03/11/16 0618 03/12/16 0844 03/13/16 1741 03/15/16 0339  WBC  --  19.3*  --   --   CREATININE 0.78 0.71  --  0.72  VANCOTROUGH  --   --  12*  --     Estimated Creatinine Clearance: 106.9 mL/min (by C-G formula based on Cr of 0.72).    Allergies  Allergen Reactions  . Levaquin [Levofloxacin In D5w] Nausea And Vomiting and Other (See Comments)    Upset stomach     Antimicrobials this admission: 7/12 Vanc >>  7/12 Rocephin >>   Dose adjustments this admission: 7/15 VT = 12, changed from 1250mg  Q12H to 1500mg  Q12H  Microbiology results: None this admission.   Thank you for allowing pharmacy to be a part of this patient's care.  Demetrius Charity, PharmD Acute Care Pharmacy Resident  Pager: [phone] 03/16/2016

## 2016-03-16 NOTE — Progress Notes (Signed)
Patient ID: Jason Ballard, male   DOB: [birth year], 50 y.o.   MRN: [id number] BP 126/86 mmHg  Pulse 85  Temp(Src) 98.4 F (36.9 C) (Oral)  Resp 15  Ht 5\' 8"  (1.727 m)  Wt 71.759 kg (158 lb 3.2 oz)  BMI 24.06 kg/m2  SpO2 93% Alert and oriented x 4, speech is clear and fluent. Moving all extremities well Wound is clean and dry, no signs of infection.  Will discharge tomorrow.

## 2016-03-17 NOTE — Progress Notes (Signed)
Patient and significant other given discharge AVS, reviewed, copy placed in shadow chart. Educated on when to call MD in regards to surgical site infection s/s and CSF leak s/s. Patient was also given his Symbicort and Aclidinium inhalers. All belongings (cellphone, charger, necklace, ring, clothing) with patient. Lumbar site clean and dry. No neuro changes. Patient d/c home with wife via wheelchair and staff escort.

## 2016-03-17 NOTE — Progress Notes (Signed)
Instructed patient to STOP taking Diamox and Decadron per Dr. Christella Noa.

## 2016-03-17 NOTE — Care Management Note (Signed)
Case Management Note  Patient Details  Name: Jason Ballard MRN: [id number] Date of Birth: [birth year]  Subjective/Objective:     Pt for dc home today with wife.  IV antibiotics completed.                 Action/Plan: No dc needs identified.    Expected Discharge Date: 03/17/16 Expected Discharge Plan:  Home/Self Care  In-House Referral:     Discharge planning Services  CM Consult  Post Acute Care Choice:    Choice offered to:     DME Arranged:    DME Agency:     HH Arranged:    HH Agency:     Status of Service:  Completed, signed off  If discussed at [employer] of Stay Meetings, dates discussed:    Additional Comments:  Reinaldo Raddle, RN, BSN  Trauma/Neuro ICU Case Manager [phone]

## 2016-03-17 NOTE — Discharge Instructions (Signed)
Call if the wound leaks. You will return next week for suture removal.  You may shower.  Call if your temperature is greater than 101.5

## 2016-03-23 ENCOUNTER — Ambulatory Visit: Payer: Self-pay | Admitting: Emergency Medicine

## 2016-03-25 NOTE — Discharge Summary (Signed)
Physician Discharge Summary  Patient ID: Jason Ballard MRN: [id number] DOB/AGE: 12-07-1965 50 y.o.  Admit date: 03/05/2016 Discharge date: 03/25/2016  Admission Diagnoses:Cerebrospinal fluid leak after lumbar surgery  Discharge Diagnoses:  Active Problems:   Cerebrospinal fluid leak due to lumbar surgery   Discharged Condition: good  Hospital Course: Mr. Cusano was admitted for a third time to repair a spinal fluid leak. I took him to the operating room and primarily closed a dural rent. He was kept on bedrest and did well. On the planned day of discharge once he was allowed to liberalize his activity he started to express spinal fluid from the incision. I then placed a lumbar drain , placed him again on bedrest. This did allow the wound to fully heal. He was discharged with a dry wound, no headache, and a normal exam.   Consults: None  Significant Diagnostic Studies: none  Treatments: surgery: Exploration of lumbar wound, Primary dural repair  L4-5 and L5-S1 laminectomies for decompression with left sided foraminotomies   Discharge Exam: Blood pressure 122/71, pulse 88, temperature 97.8 F (36.6 C), temperature source Oral, resp. rate (!) 35, height 5\' 8"  (1.727 m), weight 71.8 kg (158 lb 3.2 oz), SpO2 97 %. General appearance: alert, cooperative, appears stated age and no distress Neurologic: Alert and oriented X 3, normal strength and tone. Normal symmetric reflexes. Normal coordination and gait  Disposition: 01-Home or Self Care     Medication List    TAKE these medications   acetaZOLAMIDE 250 MG tablet Commonly known as:  DIAMOX Take 250 mg by mouth 2 (two) times daily. For seven days   Aclidinium Bromide 400 MCG/ACT Aepb Inhale 2 puffs into the lungs 2 (two) times daily. TUDORZA   albuterol 108 (90 Base) MCG/ACT inhaler Commonly known as:  PROVENTIL HFA;VENTOLIN HFA Inhale 2 puffs into the lungs every 6 (six) hours as needed for wheezing.   alendronate 70 MG  tablet Commonly known as:  FOSAMAX Take 70 mg by mouth every Friday.   buPROPion 150 MG 24 hr tablet Commonly known as:  WELLBUTRIN XL Take 150 mg by mouth at bedtime.   butalbital-acetaminophen-caffeine 50-325-40 MG tablet Commonly known as:  FIORICET, ESGIC Take 1 tablet by mouth daily as needed. headache   cyclobenzaprine 10 MG tablet Commonly known as:  FLEXERIL Take 10 mg by mouth at bedtime as needed for muscle spasms.   DALIRESP 500 MCG Tabs tablet Generic drug:  roflumilast Take 500 mcg by mouth daily.   dexamethasone 4 MG tablet Commonly known as:  DECADRON Take 4 mg by mouth 4 (four) times daily. Four day supply   gabapentin 300 MG capsule Commonly known as:  NEURONTIN Take 1 capsule (300 mg total) by mouth 3 (three) times daily.   meloxicam 7.5 MG tablet Commonly known as:  MOBIC Take 1 tablet (7.5 mg total) by mouth 2 (two) times daily.   methocarbamol 750 MG tablet Commonly known as:  ROBAXIN Take 1 tablet (750 mg total) by mouth 4 (four) times daily.   omeprazole 40 MG capsule Commonly known as:  PRILOSEC Take 40 mg by mouth daily.   Oxycodone HCl 20 MG Tabs Take 10-20 mg by mouth every 6 (six) hours as needed (For pain.).   pramipexole 1 MG tablet Commonly known as:  MIRAPEX Take 2 mg by mouth at bedtime.   SYMBICORT 160-4.5 MCG/ACT inhaler Generic drug:  budesonide-formoterol Inhale 2 puffs into the lungs 2 (two) times daily.      Follow-up Information  Casara Perrier L, MD In 1 week.   Specialty:  Neurosurgery Why:  For suture removal Contact information: [address] [place] [address] [phone]           Signed: Winfield Cunas 03/25/2016, 6:18 PM

## 2016-04-19 ENCOUNTER — Other Ambulatory Visit: Payer: Self-pay | Admitting: Emergency Medicine

## 2016-04-19 DIAGNOSIS — R0600 Dyspnea, unspecified: Secondary | ICD-10-CM

## 2016-04-20 ENCOUNTER — Ambulatory Visit (INDEPENDENT_AMBULATORY_CARE_PROVIDER_SITE_OTHER): Payer: Medicare Other | Admitting: Emergency Medicine

## 2016-04-20 ENCOUNTER — Encounter: Payer: Self-pay | Admitting: Emergency Medicine

## 2016-04-20 VITALS — BP 100/70 | HR 74 | Ht 68.0 in | Wt 155.0 lb

## 2016-04-20 DIAGNOSIS — J869 Pyothorax without fistula: Secondary | ICD-10-CM | POA: Diagnosis not present

## 2016-04-20 DIAGNOSIS — J449 Chronic obstructive pulmonary disease, unspecified: Secondary | ICD-10-CM

## 2016-04-20 LAB — PULMONARY FUNCTION TEST
DL/VA % pred: 95 %
DL/VA: 4.32 ml/min/mmHg/L
DLCO COR % PRED: 90 %
DLCO COR: 26.83 ml/min/mmHg
DLCO UNC % PRED: 92 %
DLCO UNC: 27.42 ml/min/mmHg
FEF 25-75 POST: 0.79 L/s
FEF 25-75 Pre: 0.43 L/sec
FEF2575-%Change-Post: 83 %
FEF2575-%PRED-POST: 24 %
FEF2575-%PRED-PRE: 13 %
FEV1-%CHANGE-POST: 25 %
FEV1-%PRED-POST: 42 %
FEV1-%Pred-Pre: 33 %
FEV1-POST: 1.54 L
FEV1-Pre: 1.23 L
FEV1FVC-%Change-Post: 4 %
FEV1FVC-%PRED-PRE: 57 %
FEV6-%Change-Post: 20 %
FEV6-%PRED-POST: 68 %
FEV6-%Pred-Pre: 56 %
FEV6-POST: 3.11 L
FEV6-Pre: 2.58 L
FEV6FVC-%CHANGE-POST: 1 %
FEV6FVC-%PRED-POST: 98 %
FEV6FVC-%Pred-Pre: 97 %
FVC-%Change-Post: 19 %
FVC-%Pred-Post: 69 %
FVC-%Pred-Pre: 58 %
FVC-PRE: 2.74 L
FVC-Post: 3.27 L
POST FEV1/FVC RATIO: 47 %
PRE FEV1/FVC RATIO: 45 %
Post FEV6/FVC ratio: 95 %
Pre FEV6/FVC Ratio: 94 %
RV % pred: 219 %
RV: 4.25 L
TLC % PRED: 126 %
TLC: 8.29 L

## 2016-04-20 NOTE — Assessment & Plan Note (Signed)
Please continue your tudorza and symbicort Continue albuterol as needed Use your oxygen at night and to keep Spo2 > 90%.  CONGRATULATIONS on stopping smoking! Do NOT restart.  Get the flu shot this Fall.  Follow with Dr Lamonte Sakai in 6 months or sooner if you have any problems

## 2016-04-20 NOTE — Assessment & Plan Note (Signed)
Resolved. No indication at this time to repeat his CT scan of the chest.

## 2016-04-20 NOTE — Progress Notes (Signed)
Subjective:    Patient ID: Jason Ballard, male    DOB: 1966-07-27, 50 y.o.   MRN: [id number]  HPI 50 year old male former heavy  smoker, quit April 2016, seen for pulmonary consult for cavitary pneumonia/empyema requiring VATS Initially admitted April 11 through 12/24/2014 for empyema with cavitary pneumonia and pneumothorax. Readmitted May 2 for sepsis secondary to pneumonia.  post hospital follow-up Patient returns for a post hospital follow-up Patient recently had 2 hospitalizations. Initially admitted April 11 through April 26 4 empyema with right-sided cavitary pneumonia and pneumothorax. He underwent a RIGHT VATS with drainage of large empyema and decortication of the right lower lobe on April 22. He was discharged on Augmentin for 1 month Patient return back to the emergency room with fever, shortness of breath and suspected sepsis secondary to right-sided pneumonia. He was treated with aggressive IV anabiotic's and discharged on Levaquin and Flagyl for 2 additional weeks. Since discharge. Patient says he is slowly improving. Cough and congestion are decreasing. Patient has previously been followed by pulmonary and [place], [place] . However, says that he does not want to go back there. He is on [place], Daliresp and nocturnal oxygen. Patient has not smoked since discharge He has finished his anabiotic's. He denies any fever, discolored mucus, chest pain, orthopnea, PND, leg swelling, nausea, vomiting, diarrhea. Appetite is starting to return. He has had significant weight loss. Chest x-ray today shows improvement in right sided consolidation.  Patient has a former history of heavy smoking. History of IV drug use. Chronic pain syndrome with previous neck surgery on chronic narcotics.  ROV 02/21/15 -- follow-up visit for history of tobacco use,necrotic right lower lobe pneumonia requiring VATS decortication on 12/20/2014. His last CT scan of the chest was 02/18/15  compared with 12/29/14. I reviewed the new scan which shows that his right upper lobe cavitary lesion is improved. The lesion now measures 5.3 x 4.3 cm, is thin-walled and does not appear to have any air-fluid level. No obvious pleural disease.   He is on [place], daliresp, albuterol.   ROV1/17/17 -- follow-up visit for abnormal CT scan of the chest due to necrotic right lower lobe pneumonia, also with a history of tobacco use and presumed COPD. He has been managed on [place]. Still smokes a few occasionally.  He has done well, denies any fevers, chills, sputum. He does use albuterol prn, not every day. Remains active. He had back imaging for an injury at [facility] that made comment about a R sided abnormality. Still some paraesthesia at chest tube site.  I personally reviewed MRI spine from [facility] from 09/05/15 that did show a right lower lobe opacity but could not be directly compared to his old CTs. descxribes some occasional lightheadedness w bending and exertion  ROV 04/20/16 -- Patient has a history of tobacco use, presumed COPD and a necrotic right lower lobe pneumonia status post left decortication in April 2016. He returns today after pulmonary function testing lab person reviewed. This shows severe obstruction with a positive bronchodilator response, profound hyperinflation and a normal diffusion capacity. He underwent lumbar spinal surgery that was complicated by CSF leak and repeat surgeries. He is on tudorza and symbicort, daliresp - feels that this has helped him significantly.  Uses albuterol rarely, about once a week. Minimal cough, occasional wheeze. Does worst in the hot air.    Review of Systems As per HPI.      Objective:   Physical Exam  Vitals:   04/20/16  1027 04/20/16 1029  BP:  100/70  Pulse:  74  SpO2:  95%  Weight: 155 lb (70.3 kg)   Height: 5\' 8"  (1.727 m)    Gen: Pleasant, well-nourished, in no distress,  normal affect  ENT: No lesions,   mouth clear,  oropharynx clear, no postnasal drip  Neck: No JVD, no TMG, no carotid bruits  Lungs: No use of accessory muscles, clear without rales or rhonchi, insp squeak on R  Cardiovascular: RRR, heart sounds normal, no murmur or gallops, no peripheral edema  Musculoskeletal: No deformities, no cyanosis or clubbing  Neuro: alert, non focal  Skin: Warm, no lesions or rashes   02/18/15 --  COMPARISON: CT 12/29/2014  FINDINGS: Mediastinum/Nodes: No axillary supraclavicular lymphadenopathy. Small mediastinal lymph nodes are not pathologic by size criteria decreased in volume compared to prior. No pericardial fluid. Esophagus normal.  Lungs/Pleura: There is improvement in the right upper lobe cavitary lesion seen on comparison exam. Now lesion is a thin walled multilobulated cystic lesion with thin internal septations measuring 5.3 x 4.3 cm. Previously the lesion was consolidative with air-fluid levels measuring 6.2 x 4.8 cm. There is resolution of the fluid component and parenchymal inflammation.  There scattered branching nodules in the lef tlower lobe for less than 5 mm (image 39 through 44 series 3) are also improved.  Upper abdomen: Limited view of the liver, kidneys, pancreas are unremarkable. Normal adrenal glands.  Musculoskeletal: No aggressive osseous lesion.  IMPRESSION: 1. Marked improvement in previously seen cavitary lesion with consolidation air-fluid levels in the right upper lobe. Lesion is now thin walled with out significant inflammation. 2. Improvement in bibasilar airspace nodularity also consists with improved pulmonary infectious pattern. Mild nodularity remains in the left lung base. 3. Small mediastinal lymph nodes are not pathologic by size criteria improved from prior.     Assessment & Plan:  COPD (chronic obstructive pulmonary disease) ([place]) Please continue your tudorza and symbicort Continue albuterol as needed Use your oxygen at  night and to keep Spo2 > 90%.  CONGRATULATIONS on stopping smoking! Do NOT restart.  Get the flu shot this Fall.  Follow with Dr Lamonte Sakai in 6 months or sooner if you have any problems  Lung abscess Resolved. No indication at this time to repeat his CT scan of the chest.  Baltazar Apo, MD, PhD 04/20/2016, 11:03 AM [facility] [phone] or if no answer [phone]

## 2016-04-20 NOTE — Patient Instructions (Addendum)
Please continue your tudorza and symbicort Continue albuterol as needed Use your oxygen at night and to keep Spo2 > 90%.  CONGRATULATIONS on stopping smoking! Do NOT restart.  Get the flu shot this Fall.  Follow with Dr Lamonte Sakai in 6 months or sooner if you have any problems

## 2016-08-25 ENCOUNTER — Telehealth: Payer: Self-pay | Admitting: Emergency Medicine

## 2016-08-25 MED ORDER — DOXYCYCLINE HYCLATE 100 MG PO TABS: 100.0000 mg | ORAL_TABLET | Freq: Two times a day (BID) | ORAL | 0 refills | Status: AC

## 2016-08-25 NOTE — Telephone Encounter (Signed)
Pt spoke with pt's wife, who states their grandchildren currently have RSV. Pt c/o prod cough with orange to green mucus, increased sob, fever (unsure how high), body aches (feels it's from coughing), voice hoarseness & increased fatigue X 3d Pt taking mucinex DM daily, ventolin bid, duoneb nightly with slight relief. Pt is requesting an abx to be called in.   TP please advise. Thanks

## 2016-08-25 NOTE — Telephone Encounter (Signed)
Spoke with pt's wife, aware of results/recs.  rx sent to pharmacy.  Nothing further needed.

## 2016-08-25 NOTE — Telephone Encounter (Signed)
Doxycyline 100mg  Twice daily  #14, for 1 week no refills  Mucinex DM Twice daily  As needed  Cough/congestion  Please contact office for sooner follow up if symptoms do not improve or worsen or seek emergency care

## 2016-10-14 IMAGING — CR DG CHEST 1V PORT
1 series · 1 of 1 positions shown · non-contrast
Comparison: 12/11/2014

CLINICAL DATA: Followup pneumonia.

EXAM:
PORTABLE CHEST - 1 VIEW

[AP]
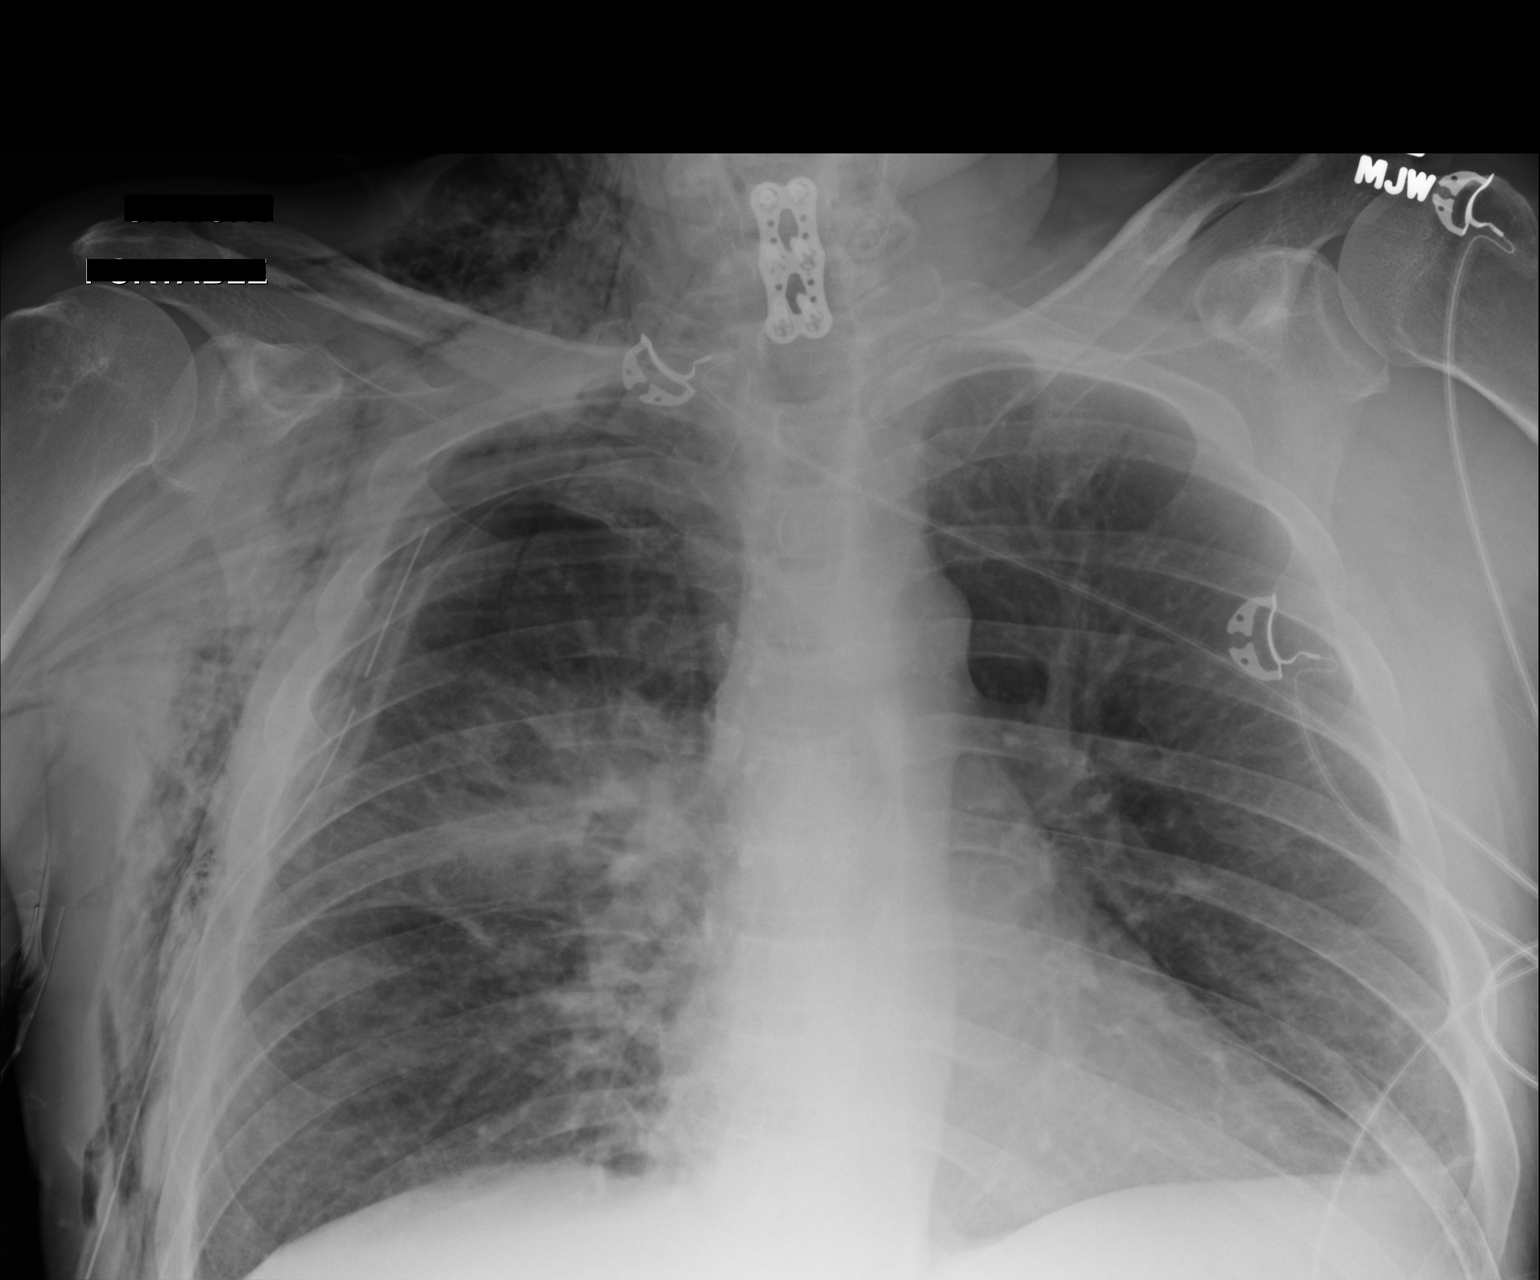

[1 of 1 positions shown; findings below may reference images not displayed]

FINDINGS: Since the previous day's study, a small right pneumothorax has
become apparent, estimated at 20%. Right-sided chest tube is stable.
Significant right-sided subcutaneous emphysema is also without
substantial change.

Airspace consolidation in right upper lobe extending from the right
hilum is similar to the prior exam. Reticular opacities are noted in
both lower lungs, increased from the previous day's study, likely
atelectasis.

No left pneumothorax.  No convincing pleural effusion.
IMPRESSION: 1. Approximately 20% right-sided pneumothorax, new from the previous
day's study.
2. Stable right upper lobe consolidation.
3. Mild increase in lung base reticular opacity most likely
atelectasis. No other change.

## 2016-10-15 IMAGING — CR DG CHEST 1V PORT
1 series · 1 of 1 positions shown · non-contrast
Comparison: 12/12/2014

CLINICAL DATA: Right pneumothorax.  Chest tube.

EXAM:
PORTABLE CHEST - 1 VIEW

[AP]
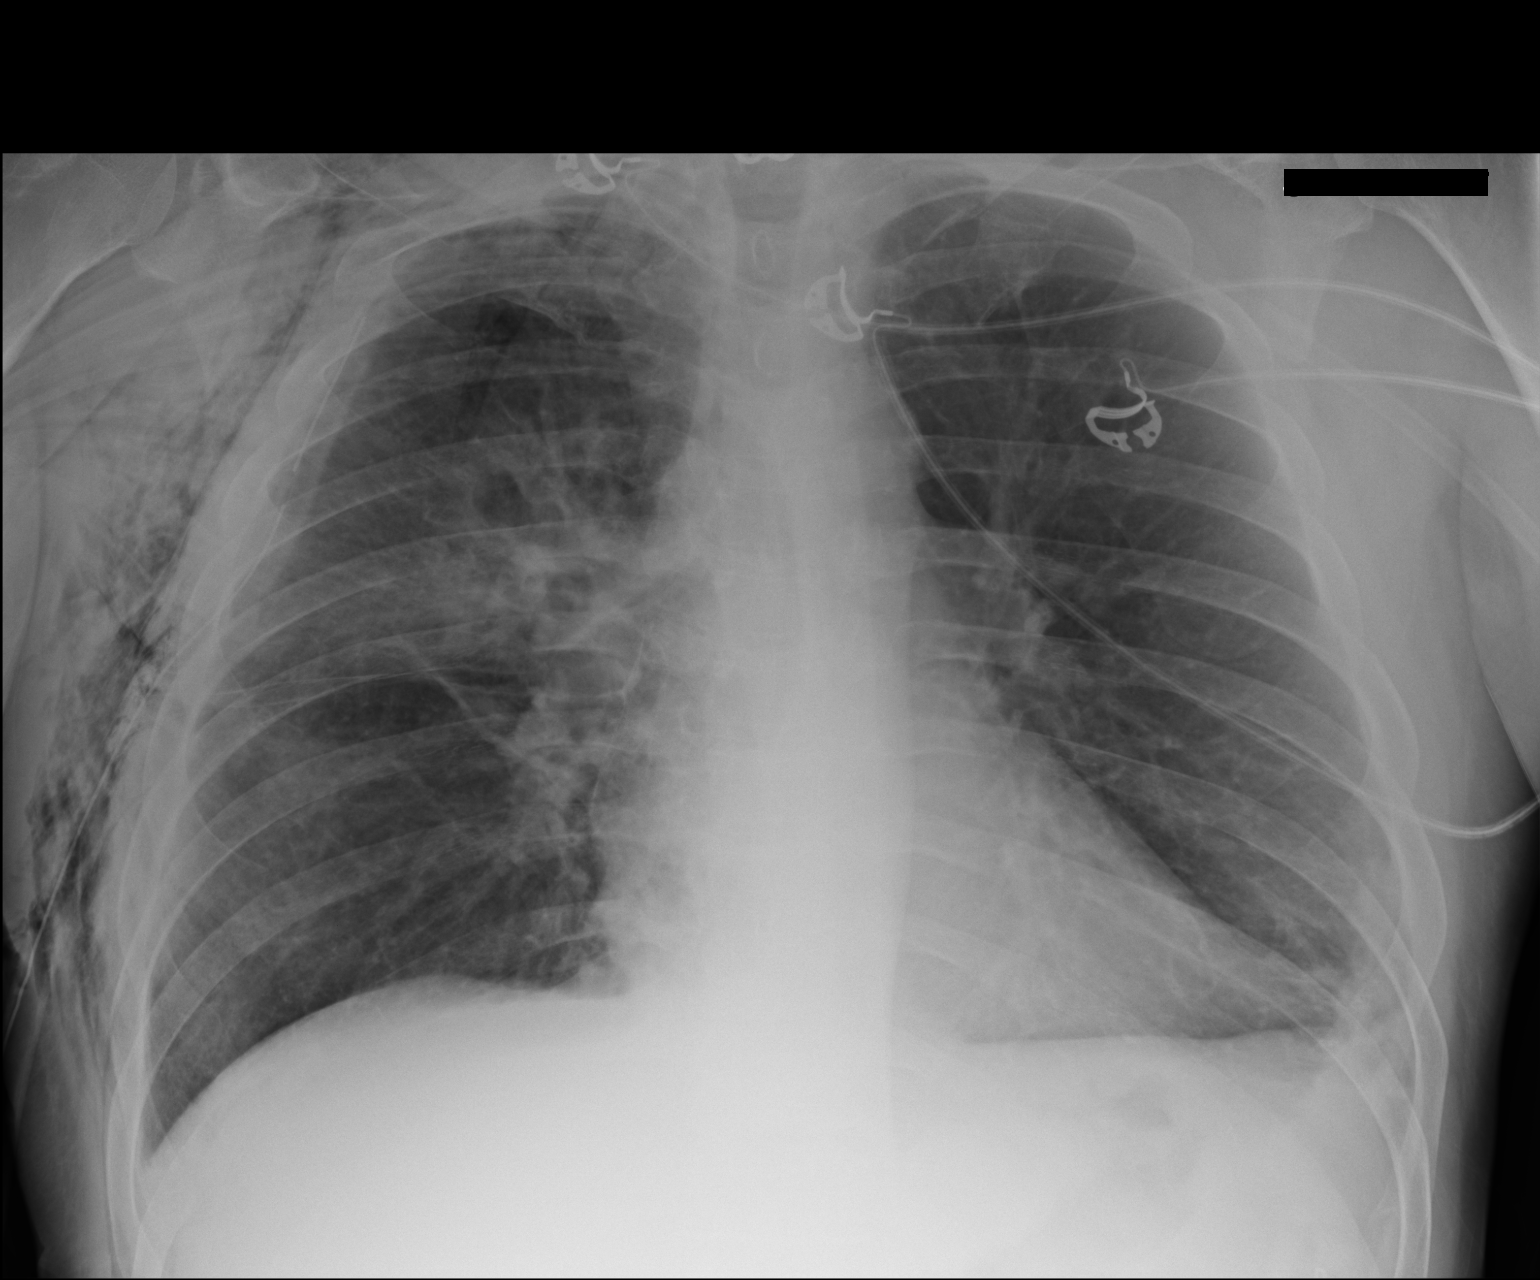

[1 of 1 positions shown; findings below may reference images not displayed]

FINDINGS: The right pneumothorax has resolved. Decreased subcutaneous
emphysema on the right. Chest tube remains in good position.
Improving cavitary infiltrate in the right upper lobe. Slight
progression of new atelectasis at the left lung base laterally.
Heart size and pulmonary vascularity are normal.
IMPRESSION: 1. Resolution of right pneumothorax.
2. Improving cavitary infiltrate in the right upper lobe.
3. Slightly increasing left base atelectasis laterally.

## 2016-10-17 IMAGING — CR DG CHEST 1V PORT
1 series · 1 of 1 positions shown · non-contrast
Comparison: 12/14/2014

CLINICAL DATA: Pneumothorax

EXAM:
PORTABLE CHEST - 1 VIEW

[AP]
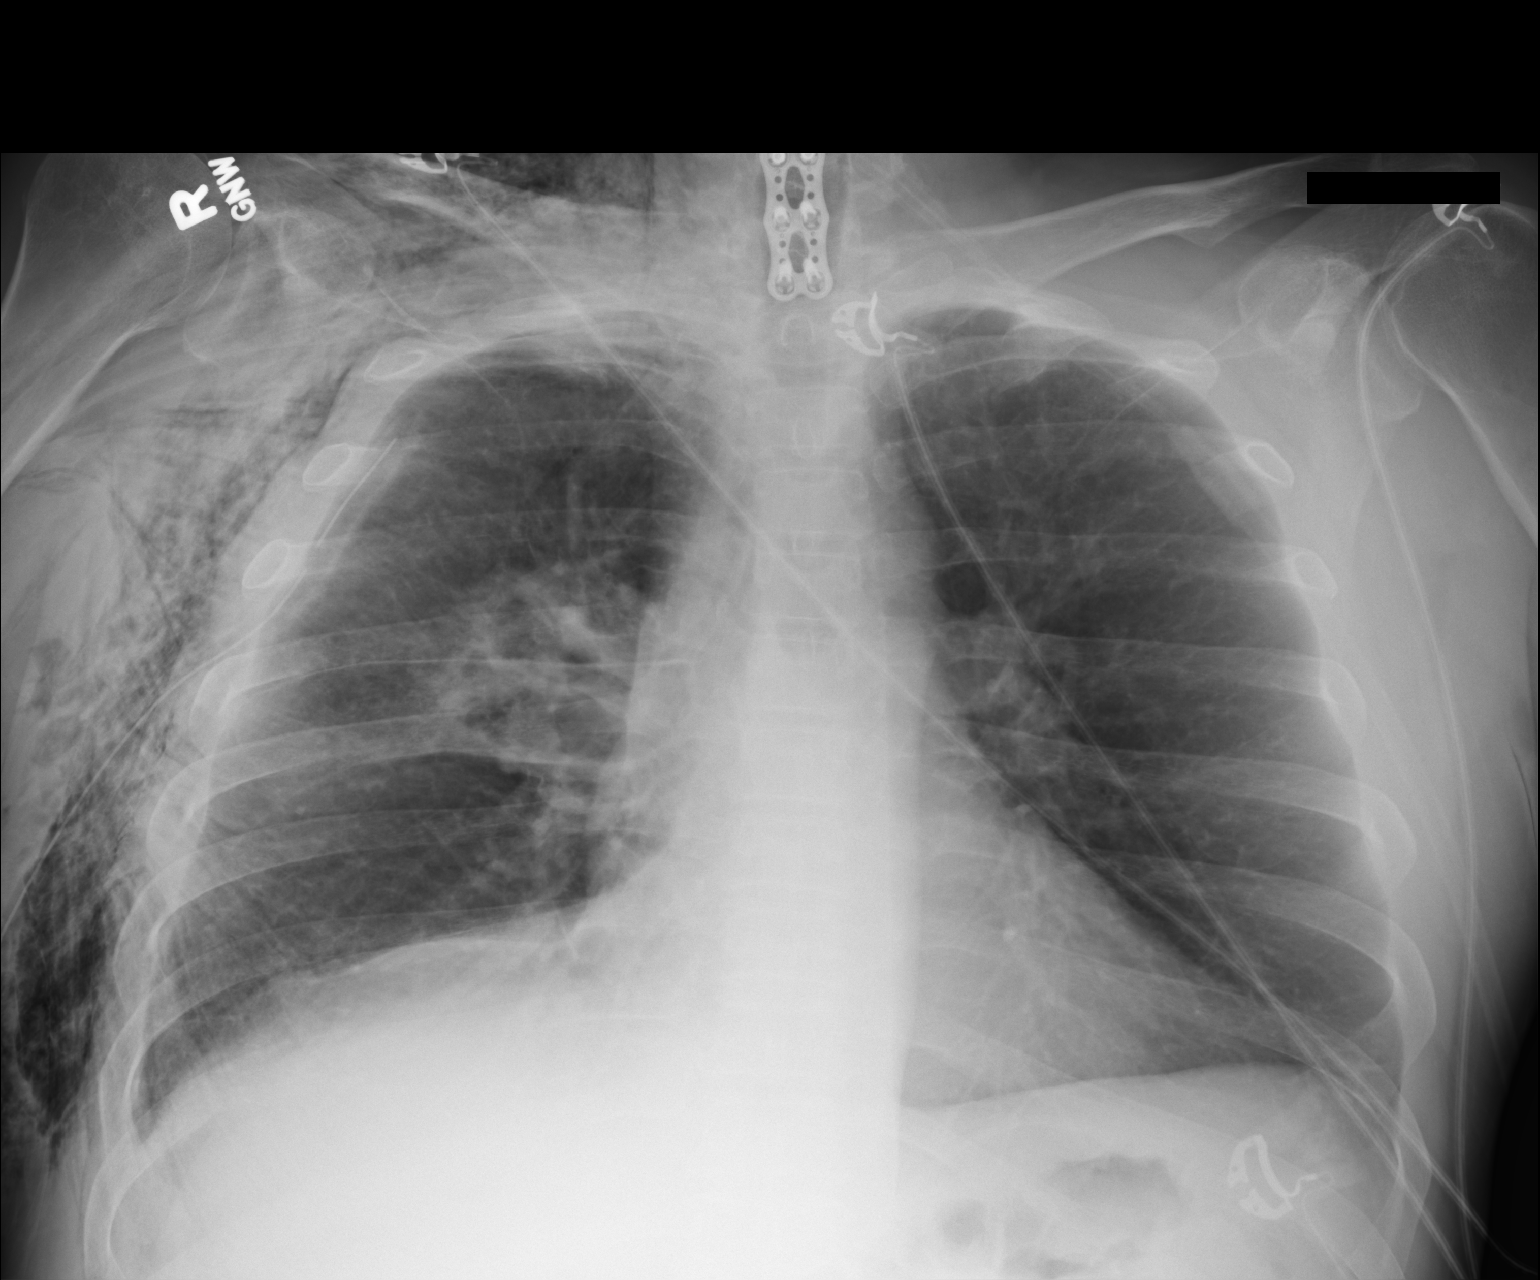

[1 of 1 positions shown; findings below may reference images not displayed]

FINDINGS: Unchanged positioning of the right-sided chest tube. A trace right
apical pneumothorax is present, 5% or less. Extensive right chest
wall emphysema is stable.

The cavitary pneumonia in the right upper lobe it is again noted.
Clear left chest. Normal heart size.
IMPRESSION: 1. Trace right apical pneumothorax with unchanged chest tube
positioning.
2. Cavitary right upper lobe pneumonia.

## 2016-11-05 ENCOUNTER — Ambulatory Visit: Payer: Self-pay | Admitting: Emergency Medicine

## 2016-11-08 ENCOUNTER — Ambulatory Visit (INDEPENDENT_AMBULATORY_CARE_PROVIDER_SITE_OTHER): Payer: Medicare Other | Admitting: Emergency Medicine

## 2016-11-08 ENCOUNTER — Encounter: Payer: Self-pay | Admitting: Emergency Medicine

## 2016-11-08 DIAGNOSIS — F17200 Nicotine dependence, unspecified, uncomplicated: Secondary | ICD-10-CM | POA: Insufficient documentation

## 2016-11-08 DIAGNOSIS — J449 Chronic obstructive pulmonary disease, unspecified: Secondary | ICD-10-CM

## 2016-11-08 NOTE — Patient Instructions (Addendum)
Continue to work on decreasing your cigarettes.  Continue tudorza and symbicort, daliresp as you are taking them  Flu shot up to date.  Take albuterol 2 puffs up to every 4 hours if needed for shortness of breath.  Follow with Dr Lamonte Sakai in 6 months or sooner if you have any problems

## 2016-11-08 NOTE — Assessment & Plan Note (Signed)
discussed cessation today.

## 2016-11-08 NOTE — Progress Notes (Signed)
Subjective:    Patient ID: Jason Ballard, male    DOB: 30-Nov-1965, 51 y.o.   MRN: [id number]  HPI 51 year old male former heavy  smoker, quit April 2016, seen for pulmonary consult for cavitary pneumonia/empyema requiring VATS Initially admitted April 11 through 12/24/2014 for empyema with cavitary pneumonia and pneumothorax. Readmitted May 2 for sepsis secondary to pneumonia.  Patient has a former history of heavy smoking. History of IV drug use. Chronic pain syndrome with previous neck surgery on chronic narcotics.  ROV 02/21/15 -- follow-up visit for history of tobacco use,necrotic right lower lobe pneumonia requiring VATS decortication on 12/20/2014. His last CT scan of the chest was 02/18/15 compared with 12/29/14. I reviewed the new scan which shows that his right upper lobe cavitary lesion is improved. The lesion now measures 5.3 x 4.3 cm, is thin-walled and does not appear to have any air-fluid level. No obvious pleural disease.   He is on [place], daliresp, albuterol.   ROV1/17/17 -- follow-up visit for abnormal CT scan of the chest due to necrotic right lower lobe pneumonia, also with a history of tobacco use and presumed COPD. He has been managed on [place]. Still smokes a few occasionally.  He has done well, denies any fevers, chills, sputum. He does use albuterol prn, not every day. Remains active. He had back imaging for an injury at [facility] that made comment about a R sided abnormality. Still some paraesthesia at chest tube site.  I personally reviewed MRI spine from [facility] from 09/05/15 that did show a right lower lobe opacity but could not be directly compared to his old CTs. descxribes some occasional lightheadedness w bending and exertion  ROV 04/20/16 -- Patient has a history of tobacco use, presumed COPD and a necrotic right lower lobe pneumonia status post left decortication in April 2016. He returns today after pulmonary function testing lab person  reviewed. This shows severe obstruction with a positive bronchodilator response, profound hyperinflation and a normal diffusion capacity. He underwent lumbar spinal surgery that was complicated by CSF leak and repeat surgeries. He is on tudorza and symbicort, daliresp - feels that this has helped him significantly.  Uses albuterol rarely, about once a week. Minimal cough, occasional wheeze. Does worst in the hot air.   ROV 11/08/16 -- history of COPD, necrotic RLL PNA s/p VATS decortication. Currently on  tudorza and symbicort, daliresp. Uses albuterol about 1x a day. Usually w exertion. Her has noticed some nasal congestion, some increased dyspnea w weather change. He is back to 5 cigarettes a day. He was treated for bronchitis after a URI in 12/17.    Review of Systems As per HPI.      Objective:   Physical Exam  Vitals:   11/08/16 0955  BP: 100/60  Pulse: 70  SpO2: 94%  Weight: 155 lb (70.3 kg)  Height: 5\' 8"  (1.727 m)   Gen: Pleasant, well-nourished, in no distress,  normal affect  ENT: No lesions,  mouth clear,  oropharynx clear, no postnasal drip  Neck: No JVD, no TMG, no carotid bruits  Lungs: No use of accessory muscles, clear without rales or rhonchi, insp squeak on R  Cardiovascular: RRR, heart sounds normal, no murmur or gallops, no peripheral edema  Musculoskeletal: No deformities, no cyanosis or clubbing  Neuro: alert, non focal  Skin: Warm, no lesions or rashes   02/18/15 --  COMPARISON: CT 12/29/2014  FINDINGS: Mediastinum/Nodes: No axillary supraclavicular lymphadenopathy. Small mediastinal lymph nodes are not pathologic  by size criteria decreased in volume compared to prior. No pericardial fluid. Esophagus normal.  Lungs/Pleura: There is improvement in the right upper lobe cavitary lesion seen on comparison exam. Now lesion is a thin walled multilobulated cystic lesion with thin internal septations measuring 5.3 x 4.3 cm. Previously the lesion was  consolidative with air-fluid levels measuring 6.2 x 4.8 cm. There is resolution of the fluid component and parenchymal inflammation.  There scattered branching nodules in the lef tlower lobe for less than 5 mm (image 39 through 44 series 3) are also improved.  Upper abdomen: Limited view of the liver, kidneys, pancreas are unremarkable. Normal adrenal glands.  Musculoskeletal: No aggressive osseous lesion.  IMPRESSION: 1. Marked improvement in previously seen cavitary lesion with consolidation air-fluid levels in the right upper lobe. Lesion is now thin walled with out significant inflammation. 2. Improvement in bibasilar airspace nodularity also consists with improved pulmonary infectious pattern. Mild nodularity remains in the left lung base. 3. Small mediastinal lymph nodes are not pathologic by size criteria improved from prior.     Assessment & Plan:  COPD (chronic obstructive pulmonary disease) ([place]) Continue to work on decreasing your cigarettes.  Continue tudorza and symbicort, daliresp as you are taking them  Flu shot up to date.  Take albuterol 2 puffs up to every 4 hours if needed for shortness of breath.  Follow with Dr Lamonte Sakai in 6 months or sooner if you have any problems  Tobacco use disorder discussed cessation today.   Baltazar Apo, MD, PhD 11/08/2016, 10:07 AM [facility] [phone] or if no answer [phone]

## 2016-11-08 NOTE — Assessment & Plan Note (Signed)
Continue to work on decreasing your cigarettes.  Continue tudorza and symbicort, daliresp as you are taking them  Flu shot up to date.  Take albuterol 2 puffs up to every 4 hours if needed for shortness of breath.  Follow with Dr Lamonte Sakai in 6 months or sooner if you have any problems

## 2017-03-28 ENCOUNTER — Telehealth: Payer: Self-pay | Admitting: Emergency Medicine

## 2017-03-28 NOTE — Telephone Encounter (Signed)
Called and spoke to pt's wife, Suanne Marker. She states the pt is still c/o prod cough and SOB. She states the pt was treated with abx by their PCP (augmentin) but this didn't help much. Advised pt's wife that pt will need to come in for an OV. Appt made with VS on 03/29/17 at 3:15p. Pt's wife verbalized understanding and denied any further questions or concerns at this time.

## 2017-03-29 ENCOUNTER — Ambulatory Visit: Payer: Self-pay | Admitting: Pulmonary Disease

## 2017-05-24 ENCOUNTER — Ambulatory Visit: Payer: Self-pay | Admitting: Emergency Medicine

## 2017-06-21 ENCOUNTER — Ambulatory Visit: Payer: Self-pay | Admitting: Adult Health

## 2018-09-23 DIAGNOSIS — E860 Dehydration: Secondary | ICD-10-CM

## 2018-09-23 DIAGNOSIS — Z720 Tobacco use: Secondary | ICD-10-CM

## 2018-09-23 DIAGNOSIS — J9601 Acute respiratory failure with hypoxia: Secondary | ICD-10-CM | POA: Diagnosis not present

## 2018-09-23 DIAGNOSIS — J441 Chronic obstructive pulmonary disease with (acute) exacerbation: Secondary | ICD-10-CM | POA: Diagnosis not present

## 2018-09-24 DIAGNOSIS — J9601 Acute respiratory failure with hypoxia: Secondary | ICD-10-CM | POA: Diagnosis not present

## 2018-09-24 DIAGNOSIS — J441 Chronic obstructive pulmonary disease with (acute) exacerbation: Secondary | ICD-10-CM | POA: Diagnosis not present

## 2019-01-29 ENCOUNTER — Other Ambulatory Visit: Payer: Self-pay | Admitting: *Deleted

## 2019-01-29 ENCOUNTER — Encounter: Payer: Self-pay | Admitting: *Deleted

## 2019-01-29 ENCOUNTER — Telehealth: Payer: Self-pay | Admitting: Diagnostic Neuroimaging

## 2019-01-29 NOTE — Telephone Encounter (Signed)
Pt gave consent for VV on the phone/ Pt understands that although there may be some limitations with this type of visit, we will take all precautions to reduce any security or privacy concerns.  Pt understands that this will be treated like an in office visit and we will file with pt's insurance, and there may be a patient responsible charge related to this service. Sent email to pt w [email or web address]

## 2019-01-29 NOTE — Telephone Encounter (Signed)
Updated EMR with referring notes dated 01/10/19.

## 2019-01-30 ENCOUNTER — Other Ambulatory Visit: Payer: Self-pay

## 2019-01-30 ENCOUNTER — Ambulatory Visit (INDEPENDENT_AMBULATORY_CARE_PROVIDER_SITE_OTHER): Payer: Medicare Other | Admitting: Diagnostic Neuroimaging

## 2019-01-30 ENCOUNTER — Encounter: Payer: Self-pay | Admitting: Diagnostic Neuroimaging

## 2019-01-30 DIAGNOSIS — R208 Other disturbances of skin sensation: Secondary | ICD-10-CM | POA: Diagnosis not present

## 2019-01-30 DIAGNOSIS — R200 Anesthesia of skin: Secondary | ICD-10-CM

## 2019-01-30 DIAGNOSIS — M5416 Radiculopathy, lumbar region: Secondary | ICD-10-CM

## 2019-01-30 NOTE — Progress Notes (Signed)
[facility]  PATIENT: Jason Ballard DOB: 1966/04/25  REFERRING CLINICIAN: N Jah HISTORY FROM: patient  REASON FOR VISIT: new consult    HISTORICAL  CHIEF COMPLAINT:  Chief Complaint  Patient presents with  . Other    left foot pain    HISTORY OF PRESENT ILLNESS:   53 year old male with history of low back pain radiating to left leg status post surgery x4, here for evaluation of left foot pain for past 2 months.  Patient describes burning, numbness, heaviness in his left foot.  He describes pain across the top of his foot and ankle.  Low back pain has been worsening.  Patient went to PCP who checked ultrasound of left foot and found "calcium on the tendons and ligaments" and referred patient to podiatrist.  Podiatrist examined patient and then referred here for consultation and possible testing for lumbar radiculopathy vs neuropathy.   Patient has tried prednisone, steroid injections, gabapentin without relief.  Patient has not been back to spine surgeon.   REVIEW OF SYSTEMS: Full 14 system review of systems performed and negative with exception of: As per HPI.   ALLERGIES: Allergies  Allergen Reactions  . Levaquin [Levofloxacin In D5w] Nausea And Vomiting and Other (See Comments)    Upset stomach     HOME MEDICATIONS: Outpatient Medications Prior to Visit  Medication Sig Dispense Refill  . Aclidinium Bromide 400 MCG/ACT AEPB Inhale 2 puffs into the lungs 2 (two) times daily. [place]    . albuterol (PROVENTIL HFA;VENTOLIN HFA) 108 (90 BASE) MCG/ACT inhaler Inhale 2 puffs into the lungs every 6 (six) hours as needed for wheezing.    Marland Kitchen alendronate (FOSAMAX) 70 MG tablet Take 70 mg by mouth every Friday.     Marland Kitchen amitriptyline (ELAVIL) 100 MG tablet Take 100 mg by mouth at bedtime.    . Cholecalciferol (VITAMIN D-3) 125 MCG (5000 UT) TABS Take by mouth.    . cyclobenzaprine (FLEXERIL) 10 MG tablet Take 10 mg by mouth at bedtime as needed for muscle  spasms.     Marland Kitchen doxycycline (VIBRA-TABS) 100 MG tablet Take 1 tablet (100 mg total) by mouth 2 (two) times daily. (Patient not taking: Reported on 01/29/2019) 14 tablet 0  . furosemide (LASIX) 20 MG tablet Take 20 mg by mouth daily.    Marland Kitchen gabapentin (NEURONTIN) 300 MG capsule Take 1 capsule (300 mg total) by mouth 3 (three) times daily. 90 capsule 2  . HYDROcodone-acetaminophen (NORCO) 10-325 MG tablet Take 1 tablet by mouth every 6 (six) hours as needed.    . Ipratropium-Albuterol (COMBIVENT) 20-100 MCG/ACT AERS respimat Inhale into the lungs as needed.    . methocarbamol (ROBAXIN) 750 MG tablet Take 1 tablet (750 mg total) by mouth 4 (four) times daily. (Patient not taking: Reported on 01/29/2019) 120 tablet 2  . ondansetron (ZOFRAN) 8 MG tablet Take by mouth every 8 (eight) hours as needed for nausea or vomiting.    . Oxycodone HCl 20 MG TABS Take 10-20 mg by mouth every 6 (six) hours as needed (For pain.).     Marland Kitchen pramipexole (MIRAPEX) 1 MG tablet Take 2 mg by mouth at bedtime.    . roflumilast (DALIRESP) 500 MCG TABS tablet Take 500 mcg by mouth daily.    . sildenafil (VIAGRA) 100 MG tablet Take 100 mg by mouth daily as needed for erectile dysfunction.    . SYMBICORT 160-4.5 MCG/ACT inhaler Inhale 2 puffs into the lungs 2 (two) times daily.     No  facility-administered medications prior to visit.     PAST MEDICAL HISTORY: Past Medical History:  Diagnosis Date  . Anxiety   . Asthma   . Cervicalgia   . Chronic pain syndrome   . COPD (chronic obstructive pulmonary disease) ([place])   . Emphysema   . GERD (gastroesophageal reflux disease)   . Heart failure ([place])   . Hyperlipidemia   . Hypertension   . Insomnia   . Neuropathy   . Numbness and tingling    and burning  . Other vitamin B12 deficiency anemias   . Pneumonia   . RLS (restless legs syndrome)     PAST SURGICAL HISTORY: Past Surgical History:  Procedure Laterality Date  . ANTERIOR CERVICAL DECOMP/DISCECTOMY FUSION N/A 05/03/2013    Procedure: ANTERIOR CERVICAL DECOMPRESSION/DISCECTOMY FUSION Cervical Three-Four;  Surgeon: Faythe Ghee, MD;  Location: [facility];  Service: Neurosurgery;  Laterality: N/A;  . APPENDECTOMY  2007  . BACK SURGERY  2008   neck surgery  . ELBOW SURGERY Right 2008  . LUMBAR LAMINECTOMY/DECOMPRESSION MICRODISCECTOMY N/A 02/05/2016   Procedure: Lumbar four- five Lumbar five-Sacral one Laminectomy with Left Side foraminotomy;  Surgeon: Kevan Ny Ditty, MD;  Location: [facility];  Service: Neurosurgery;  Laterality: N/A;  L4-5 L5-S1 Laminectomy with Left Side foraminotomy  . LUMBAR WOUND DEBRIDEMENT N/A 02/23/2016   Procedure: LUMBAR WOUND DEBRIDEMENT repair of CSF leak;  Surgeon: Kevan Ny Ditty, MD;  Location: [facility];  Service: Neurosurgery;  Laterality: N/A;  . LUMBAR WOUND DEBRIDEMENT N/A 03/05/2016   Procedure: Exploration of lumbar wound, CSF Leak Repair ;  Surgeon: Ashok Pall, MD;  Location: [facility];  Service: Neurosurgery;  Laterality: N/A;  Exploration of lumbar wound  . nerve replacement in right elbow, 2008    . VIDEO ASSISTED THORACOSCOPY (VATS)/EMPYEMA Right 12/19/2014   Procedure: VIDEO ASSISTED THORACOSCOPY/right thoracotomy (VATS)/DRAIN EMPYEMA;  Surgeon: Ivin Poot, MD;  Location: [facility];  Service: Thoracic;  Laterality: Right;  . WOUND EXPLORATION N/A 02/07/2016   Procedure: Lumbar WOUND EXPLORATION, Primary Dura Repair for Spinal Fluid Leak;  Surgeon: Ashok Pall, MD;  Location: [facility];  Service: Orthopedics;  Laterality: N/A;    FAMILY HISTORY: Family History  Problem Relation Age of Onset  . Thyroid disease Mother   . Diabetes Mellitus II Neg Hx   . Stroke Neg Hx     SOCIAL HISTORY: Social History   Socioeconomic History  . Marital status: Married    Spouse name: Not on file  . Number of children: 3  . Years of education: Not on file  . Highest education level: Not on file  Occupational History  . Not on file  Social Needs  .  Financial resource strain: Not on file  . Food insecurity:    Worry: Not on file    Inability: Not on file  . Transportation needs:    Medical: Not on file    Non-medical: Not on file  Tobacco Use  . Smoking status: Current Every Day Smoker    Packs/day: 1.50    Years: 33.00    Pack years: 49.50    Types: Cigarettes  . Smokeless tobacco: Never Used  . Tobacco comment: Pt states he smokes 5 cigarettes a day.   Substance and Sexual Activity  . Alcohol use: Yes    Comment: socially  . Drug use: No  . Sexual activity: Not on file  Lifestyle  . Physical activity:    Days per week: Not on  file    Minutes per session: Not on file  . Stress: Not on file  Relationships  . Social connections:    Talks on phone: Not on file    Gets together: Not on file    Attends religious service: Not on file    Active member of club or organization: Not on file    Attends meetings of clubs or organizations: Not on file    Relationship status: Not on file  . Intimate partner violence:    Fear of current or ex partner: Not on file    Emotionally abused: Not on file    Physically abused: Not on file    Forced sexual activity: Not on file  Other Topics Concern  . Not on file  Social History Narrative  . Not on file     PHYSICAL EXAM  VIDEO EXAM  GENERAL EXAM/CONSTITUTIONAL:  Vitals: There were no vitals filed for this visit.  There is no height or weight on file to calculate BMI. Wt Readings from Last 3 Encounters:  11/08/16 155 lb (70.3 kg)  04/20/16 155 lb (70.3 kg)  03/07/16 158 lb 3.2 oz (71.8 kg)     Patient is in no distress; well developed, nourished and groomed; neck is supple   NEUROLOGIC: MENTAL STATUS:  No flowsheet data found.  awake, alert, oriented to person, place and time  recent and remote memory intact  normal attention and concentration  language fluent, comprehension intact, naming intact  fund of knowledge appropriate  CRANIAL NERVE:   2nd, 3rd,  4th, 6th - visual fields full to confrontation, extraocular muscles intact, no nystagmus  5th - facial sensation symmetric  7th - facial strength symmetric  8th - hearing intact  11th - shoulder shrug symmetric  12th - tongue protrusion midline  MOTOR:   NO TREMOR; NO DRIFT IN BUE  SLIGHTLY SLOWER FOOT TAPPING ON LEFT  SENSORY:   normal and symmetric to light touch; EXCEPT DECR IN LEFT FOOT  COORDINATION:   fine finger movements normal      DIAGNOSTIC DATA (LABS, IMAGING, TESTING) - I reviewed patient records, labs, notes, testing and imaging myself where available.  Lab Results  Component Value Date   WBC 19.3 (H) 03/12/2016   HGB 15.4 03/12/2016   HCT 45.7 03/12/2016   MCV 88.2 03/12/2016   PLT 280 03/12/2016      Component Value Date/Time   NA 134 (L) 03/15/2016 0339   K 3.7 03/15/2016 0339   CL 101 03/15/2016 0339   CO2 29 03/15/2016 0339   GLUCOSE 96 03/15/2016 0339   BUN 12 03/15/2016 0339   CREATININE 0.72 03/15/2016 0339   CALCIUM 8.4 (L) 03/15/2016 0339   PROT 6.6 01/02/2015 0816   ALBUMIN 2.0 (L) 01/02/2015 0816   AST 29 01/02/2015 0816   ALT 36 01/02/2015 0816   ALKPHOS 141 (H) 01/02/2015 0816   BILITOT 0.5 01/02/2015 0816   GFRNONAA >60 03/15/2016 0339   GFRAA >60 03/15/2016 0339   No results found for: CHOL, HDL, LDLCALC, LDLDIRECT, TRIG, CHOLHDL Lab Results  Component Value Date   HGBA1C 5.7 (H) 12/11/2014   No results found for: VITAMINB12 No results found for: TSH   09/15/17 MRI lumbar spine [I reviewed images myself and agree with interpretation. There is moderate foraminal stenosis at L4-5 and L5-S1 on the left side. -VRP]  1. 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 Stable postsurgical changes at L5-S1.  2. No focal neural impingement.    ASSESSMENT AND PLAN  53 y.o. year old male here with new onset of left foot numbness, pain, heaviness in the last 2  months, with history of left lumbar radiculopathy status post surgery x4.  May represent recurrence of left lumbar radiculopathy.  Dx:  1. Numbness of left foot   2. Left lumbar radiculitis     Virtual Visit via Video Note  I connected with Alethia Berthold on 01/30/19 at 10:00 AM EDT by a video enabled telemedicine application and verified that I am speaking with the correct person using two identifiers.  Location: Patient: home  Provider: office   I discussed the limitations of evaluation and management by telemedicine and the availability of in person appointments. The patient expressed understanding and agreed to proceed.   I discussed the assessment and treatment plan with the patient. The patient was provided an opportunity to ask questions and all were answered. The patient agreed with the plan and demonstrated an understanding of the instructions.   The patient was advised to call back or seek an in-person evaluation if the symptoms worsen or if the condition fails to improve as anticipated.  I provided 30 minutes of non-face-to-face time during this encounter.   PLAN:  - patient would like follow up with spine surgeon / pain mgmt; will return patient to PCP to setup referral if needed - considered to check MRI lumbar spine, but will defer to spine surgeon (who may prefer CT myelogram vs MRI lumbar)  Return for return to PCP, pending if symptoms worsen or fail to improve.    Penni Bombard, MD 11/02/4654, 81:27 AM Certified in Neurology, Neurophysiology and Neuroimaging  [facility] [address] [place], [place] [address] [phone]

## 2019-02-08 ENCOUNTER — Encounter: Payer: Medicare Other | Admitting: Diagnostic Neuroimaging

## 2022-08-18 DIAGNOSIS — J449 Chronic obstructive pulmonary disease, unspecified: Secondary | ICD-10-CM | POA: Diagnosis not present

## 2022-08-18 DIAGNOSIS — G4700 Insomnia, unspecified: Secondary | ICD-10-CM | POA: Diagnosis not present

## 2022-08-18 DIAGNOSIS — E559 Vitamin D deficiency, unspecified: Secondary | ICD-10-CM | POA: Diagnosis not present

## 2022-08-18 DIAGNOSIS — M5442 Lumbago with sciatica, left side: Secondary | ICD-10-CM | POA: Diagnosis not present

## 2022-08-18 DIAGNOSIS — E782 Mixed hyperlipidemia: Secondary | ICD-10-CM | POA: Diagnosis not present

## 2022-08-18 DIAGNOSIS — I10 Essential (primary) hypertension: Secondary | ICD-10-CM | POA: Diagnosis not present

## 2022-08-18 DIAGNOSIS — F331 Major depressive disorder, recurrent, moderate: Secondary | ICD-10-CM | POA: Diagnosis not present

## 2022-08-24 DIAGNOSIS — J449 Chronic obstructive pulmonary disease, unspecified: Secondary | ICD-10-CM | POA: Diagnosis not present

## 2022-08-28 DIAGNOSIS — D518 Other vitamin B12 deficiency anemias: Secondary | ICD-10-CM | POA: Diagnosis not present

## 2022-08-28 DIAGNOSIS — E559 Vitamin D deficiency, unspecified: Secondary | ICD-10-CM | POA: Diagnosis not present

## 2022-08-28 DIAGNOSIS — E782 Mixed hyperlipidemia: Secondary | ICD-10-CM | POA: Diagnosis not present

## 2022-08-28 DIAGNOSIS — J449 Chronic obstructive pulmonary disease, unspecified: Secondary | ICD-10-CM | POA: Diagnosis not present

## 2022-08-28 DIAGNOSIS — J441 Chronic obstructive pulmonary disease with (acute) exacerbation: Secondary | ICD-10-CM | POA: Diagnosis not present

## 2022-08-28 DIAGNOSIS — F331 Major depressive disorder, recurrent, moderate: Secondary | ICD-10-CM | POA: Diagnosis not present

## 2022-08-28 DIAGNOSIS — E038 Other specified hypothyroidism: Secondary | ICD-10-CM | POA: Diagnosis not present

## 2022-08-28 DIAGNOSIS — M810 Age-related osteoporosis without current pathological fracture: Secondary | ICD-10-CM | POA: Diagnosis not present

## 2022-08-28 DIAGNOSIS — E119 Type 2 diabetes mellitus without complications: Secondary | ICD-10-CM | POA: Diagnosis not present

## 2022-09-13 DIAGNOSIS — E782 Mixed hyperlipidemia: Secondary | ICD-10-CM | POA: Diagnosis not present

## 2022-09-13 DIAGNOSIS — F331 Major depressive disorder, recurrent, moderate: Secondary | ICD-10-CM | POA: Diagnosis not present

## 2022-09-13 DIAGNOSIS — J449 Chronic obstructive pulmonary disease, unspecified: Secondary | ICD-10-CM | POA: Diagnosis not present

## 2022-09-13 DIAGNOSIS — I10 Essential (primary) hypertension: Secondary | ICD-10-CM | POA: Diagnosis not present

## 2022-09-24 DIAGNOSIS — J449 Chronic obstructive pulmonary disease, unspecified: Secondary | ICD-10-CM | POA: Diagnosis not present

## 2022-09-28 DIAGNOSIS — E559 Vitamin D deficiency, unspecified: Secondary | ICD-10-CM | POA: Diagnosis not present

## 2022-09-28 DIAGNOSIS — E782 Mixed hyperlipidemia: Secondary | ICD-10-CM | POA: Diagnosis not present

## 2022-09-28 DIAGNOSIS — J441 Chronic obstructive pulmonary disease with (acute) exacerbation: Secondary | ICD-10-CM | POA: Diagnosis not present

## 2022-09-28 DIAGNOSIS — D518 Other vitamin B12 deficiency anemias: Secondary | ICD-10-CM | POA: Diagnosis not present

## 2022-09-28 DIAGNOSIS — E038 Other specified hypothyroidism: Secondary | ICD-10-CM | POA: Diagnosis not present

## 2022-09-28 DIAGNOSIS — M810 Age-related osteoporosis without current pathological fracture: Secondary | ICD-10-CM | POA: Diagnosis not present

## 2022-09-28 DIAGNOSIS — J449 Chronic obstructive pulmonary disease, unspecified: Secondary | ICD-10-CM | POA: Diagnosis not present

## 2022-09-28 DIAGNOSIS — E119 Type 2 diabetes mellitus without complications: Secondary | ICD-10-CM | POA: Diagnosis not present

## 2022-09-28 DIAGNOSIS — F331 Major depressive disorder, recurrent, moderate: Secondary | ICD-10-CM | POA: Diagnosis not present

## 2022-10-13 DIAGNOSIS — Z5181 Encounter for therapeutic drug level monitoring: Secondary | ICD-10-CM | POA: Diagnosis not present

## 2022-10-13 DIAGNOSIS — E1165 Type 2 diabetes mellitus with hyperglycemia: Secondary | ICD-10-CM | POA: Diagnosis not present

## 2022-10-13 DIAGNOSIS — Z79899 Other long term (current) drug therapy: Secondary | ICD-10-CM | POA: Diagnosis not present

## 2022-10-13 DIAGNOSIS — E782 Mixed hyperlipidemia: Secondary | ICD-10-CM | POA: Diagnosis not present

## 2022-10-13 DIAGNOSIS — E559 Vitamin D deficiency, unspecified: Secondary | ICD-10-CM | POA: Diagnosis not present

## 2022-10-13 DIAGNOSIS — I10 Essential (primary) hypertension: Secondary | ICD-10-CM | POA: Diagnosis not present

## 2022-10-13 DIAGNOSIS — M5442 Lumbago with sciatica, left side: Secondary | ICD-10-CM | POA: Diagnosis not present

## 2022-10-13 DIAGNOSIS — J449 Chronic obstructive pulmonary disease, unspecified: Secondary | ICD-10-CM | POA: Diagnosis not present

## 2022-10-13 DIAGNOSIS — E291 Testicular hypofunction: Secondary | ICD-10-CM | POA: Diagnosis not present

## 2022-10-13 DIAGNOSIS — E038 Other specified hypothyroidism: Secondary | ICD-10-CM | POA: Diagnosis not present

## 2022-10-13 DIAGNOSIS — D518 Other vitamin B12 deficiency anemias: Secondary | ICD-10-CM | POA: Diagnosis not present

## 2022-10-13 DIAGNOSIS — F331 Major depressive disorder, recurrent, moderate: Secondary | ICD-10-CM | POA: Diagnosis not present

## 2022-10-15 DIAGNOSIS — R7303 Prediabetes: Secondary | ICD-10-CM | POA: Diagnosis not present

## 2022-10-15 DIAGNOSIS — E785 Hyperlipidemia, unspecified: Secondary | ICD-10-CM | POA: Diagnosis not present

## 2022-10-15 DIAGNOSIS — E559 Vitamin D deficiency, unspecified: Secondary | ICD-10-CM | POA: Diagnosis not present

## 2022-10-15 DIAGNOSIS — E291 Testicular hypofunction: Secondary | ICD-10-CM | POA: Diagnosis not present

## 2022-10-28 DIAGNOSIS — E559 Vitamin D deficiency, unspecified: Secondary | ICD-10-CM | POA: Diagnosis not present

## 2022-10-28 DIAGNOSIS — E782 Mixed hyperlipidemia: Secondary | ICD-10-CM | POA: Diagnosis not present

## 2022-10-28 DIAGNOSIS — E038 Other specified hypothyroidism: Secondary | ICD-10-CM | POA: Diagnosis not present

## 2022-10-28 DIAGNOSIS — J441 Chronic obstructive pulmonary disease with (acute) exacerbation: Secondary | ICD-10-CM | POA: Diagnosis not present

## 2022-10-28 DIAGNOSIS — J449 Chronic obstructive pulmonary disease, unspecified: Secondary | ICD-10-CM | POA: Diagnosis not present

## 2022-10-28 DIAGNOSIS — F331 Major depressive disorder, recurrent, moderate: Secondary | ICD-10-CM | POA: Diagnosis not present

## 2022-10-28 DIAGNOSIS — E119 Type 2 diabetes mellitus without complications: Secondary | ICD-10-CM | POA: Diagnosis not present

## 2022-10-28 DIAGNOSIS — D518 Other vitamin B12 deficiency anemias: Secondary | ICD-10-CM | POA: Diagnosis not present

## 2022-10-28 DIAGNOSIS — M810 Age-related osteoporosis without current pathological fracture: Secondary | ICD-10-CM | POA: Diagnosis not present

## 2022-11-01 DIAGNOSIS — M5442 Lumbago with sciatica, left side: Secondary | ICD-10-CM | POA: Diagnosis not present

## 2022-11-01 DIAGNOSIS — F331 Major depressive disorder, recurrent, moderate: Secondary | ICD-10-CM | POA: Diagnosis not present

## 2022-11-01 DIAGNOSIS — E782 Mixed hyperlipidemia: Secondary | ICD-10-CM | POA: Diagnosis not present

## 2022-11-01 DIAGNOSIS — M542 Cervicalgia: Secondary | ICD-10-CM | POA: Diagnosis not present

## 2022-11-01 DIAGNOSIS — J449 Chronic obstructive pulmonary disease, unspecified: Secondary | ICD-10-CM | POA: Diagnosis not present

## 2022-11-02 DIAGNOSIS — R011 Cardiac murmur, unspecified: Secondary | ICD-10-CM | POA: Diagnosis not present

## 2022-11-03 DIAGNOSIS — R350 Frequency of micturition: Secondary | ICD-10-CM | POA: Diagnosis not present

## 2022-11-03 DIAGNOSIS — N200 Calculus of kidney: Secondary | ICD-10-CM | POA: Diagnosis not present

## 2022-11-04 DIAGNOSIS — M545 Low back pain, unspecified: Secondary | ICD-10-CM | POA: Diagnosis not present

## 2022-11-05 DIAGNOSIS — N402 Nodular prostate without lower urinary tract symptoms: Secondary | ICD-10-CM | POA: Diagnosis not present

## 2022-11-05 DIAGNOSIS — N400 Enlarged prostate without lower urinary tract symptoms: Secondary | ICD-10-CM | POA: Diagnosis not present

## 2022-11-16 DIAGNOSIS — F419 Anxiety disorder, unspecified: Secondary | ICD-10-CM | POA: Diagnosis not present

## 2022-11-23 DIAGNOSIS — J449 Chronic obstructive pulmonary disease, unspecified: Secondary | ICD-10-CM | POA: Diagnosis not present

## 2022-11-26 DIAGNOSIS — M810 Age-related osteoporosis without current pathological fracture: Secondary | ICD-10-CM | POA: Diagnosis not present

## 2022-11-26 DIAGNOSIS — E782 Mixed hyperlipidemia: Secondary | ICD-10-CM | POA: Diagnosis not present

## 2022-11-26 DIAGNOSIS — E559 Vitamin D deficiency, unspecified: Secondary | ICD-10-CM | POA: Diagnosis not present

## 2022-11-26 DIAGNOSIS — E119 Type 2 diabetes mellitus without complications: Secondary | ICD-10-CM | POA: Diagnosis not present

## 2022-11-26 DIAGNOSIS — F331 Major depressive disorder, recurrent, moderate: Secondary | ICD-10-CM | POA: Diagnosis not present

## 2022-11-26 DIAGNOSIS — D518 Other vitamin B12 deficiency anemias: Secondary | ICD-10-CM | POA: Diagnosis not present

## 2022-11-26 DIAGNOSIS — J449 Chronic obstructive pulmonary disease, unspecified: Secondary | ICD-10-CM | POA: Diagnosis not present

## 2022-11-26 DIAGNOSIS — E038 Other specified hypothyroidism: Secondary | ICD-10-CM | POA: Diagnosis not present

## 2022-11-26 DIAGNOSIS — J441 Chronic obstructive pulmonary disease with (acute) exacerbation: Secondary | ICD-10-CM | POA: Diagnosis not present

## 2022-12-02 DIAGNOSIS — F331 Major depressive disorder, recurrent, moderate: Secondary | ICD-10-CM | POA: Diagnosis not present

## 2022-12-02 DIAGNOSIS — J449 Chronic obstructive pulmonary disease, unspecified: Secondary | ICD-10-CM | POA: Diagnosis not present

## 2022-12-02 DIAGNOSIS — E782 Mixed hyperlipidemia: Secondary | ICD-10-CM | POA: Diagnosis not present

## 2022-12-02 DIAGNOSIS — I10 Essential (primary) hypertension: Secondary | ICD-10-CM | POA: Diagnosis not present

## 2022-12-02 DIAGNOSIS — M5442 Lumbago with sciatica, left side: Secondary | ICD-10-CM | POA: Diagnosis not present

## 2022-12-02 DIAGNOSIS — G4700 Insomnia, unspecified: Secondary | ICD-10-CM | POA: Diagnosis not present

## 2022-12-21 DIAGNOSIS — E038 Other specified hypothyroidism: Secondary | ICD-10-CM | POA: Diagnosis not present

## 2022-12-21 DIAGNOSIS — M810 Age-related osteoporosis without current pathological fracture: Secondary | ICD-10-CM | POA: Diagnosis not present

## 2022-12-21 DIAGNOSIS — J449 Chronic obstructive pulmonary disease, unspecified: Secondary | ICD-10-CM | POA: Diagnosis not present

## 2022-12-21 DIAGNOSIS — E119 Type 2 diabetes mellitus without complications: Secondary | ICD-10-CM | POA: Diagnosis not present

## 2022-12-21 DIAGNOSIS — E782 Mixed hyperlipidemia: Secondary | ICD-10-CM | POA: Diagnosis not present

## 2022-12-21 DIAGNOSIS — E559 Vitamin D deficiency, unspecified: Secondary | ICD-10-CM | POA: Diagnosis not present

## 2022-12-21 DIAGNOSIS — D518 Other vitamin B12 deficiency anemias: Secondary | ICD-10-CM | POA: Diagnosis not present

## 2022-12-21 DIAGNOSIS — F331 Major depressive disorder, recurrent, moderate: Secondary | ICD-10-CM | POA: Diagnosis not present

## 2022-12-21 DIAGNOSIS — J441 Chronic obstructive pulmonary disease with (acute) exacerbation: Secondary | ICD-10-CM | POA: Diagnosis not present

## 2023-01-05 DIAGNOSIS — Z0000 Encounter for general adult medical examination without abnormal findings: Secondary | ICD-10-CM | POA: Diagnosis not present

## 2023-01-05 DIAGNOSIS — Z5181 Encounter for therapeutic drug level monitoring: Secondary | ICD-10-CM | POA: Diagnosis not present

## 2023-01-05 DIAGNOSIS — I10 Essential (primary) hypertension: Secondary | ICD-10-CM | POA: Diagnosis not present

## 2023-01-05 DIAGNOSIS — J449 Chronic obstructive pulmonary disease, unspecified: Secondary | ICD-10-CM | POA: Diagnosis not present

## 2023-01-05 DIAGNOSIS — E1165 Type 2 diabetes mellitus with hyperglycemia: Secondary | ICD-10-CM | POA: Diagnosis not present

## 2023-01-05 DIAGNOSIS — D518 Other vitamin B12 deficiency anemias: Secondary | ICD-10-CM | POA: Diagnosis not present

## 2023-01-05 DIAGNOSIS — E291 Testicular hypofunction: Secondary | ICD-10-CM | POA: Diagnosis not present

## 2023-01-05 DIAGNOSIS — E038 Other specified hypothyroidism: Secondary | ICD-10-CM | POA: Diagnosis not present

## 2023-01-05 DIAGNOSIS — M5442 Lumbago with sciatica, left side: Secondary | ICD-10-CM | POA: Diagnosis not present

## 2023-01-05 DIAGNOSIS — Z79899 Other long term (current) drug therapy: Secondary | ICD-10-CM | POA: Diagnosis not present

## 2023-01-05 DIAGNOSIS — E559 Vitamin D deficiency, unspecified: Secondary | ICD-10-CM | POA: Diagnosis not present

## 2023-01-05 DIAGNOSIS — E782 Mixed hyperlipidemia: Secondary | ICD-10-CM | POA: Diagnosis not present

## 2023-01-11 ENCOUNTER — Institutional Professional Consult (permissible substitution): Payer: Medicare Other | Admitting: Internal Medicine

## 2023-01-20 ENCOUNTER — Encounter: Payer: Self-pay | Admitting: Internal Medicine

## 2023-01-23 DIAGNOSIS — J449 Chronic obstructive pulmonary disease, unspecified: Secondary | ICD-10-CM | POA: Diagnosis not present

## 2023-01-28 DIAGNOSIS — J441 Chronic obstructive pulmonary disease with (acute) exacerbation: Secondary | ICD-10-CM | POA: Diagnosis not present

## 2023-01-28 DIAGNOSIS — E119 Type 2 diabetes mellitus without complications: Secondary | ICD-10-CM | POA: Diagnosis not present

## 2023-01-28 DIAGNOSIS — E782 Mixed hyperlipidemia: Secondary | ICD-10-CM | POA: Diagnosis not present

## 2023-01-28 DIAGNOSIS — D518 Other vitamin B12 deficiency anemias: Secondary | ICD-10-CM | POA: Diagnosis not present

## 2023-01-28 DIAGNOSIS — M810 Age-related osteoporosis without current pathological fracture: Secondary | ICD-10-CM | POA: Diagnosis not present

## 2023-01-28 DIAGNOSIS — F331 Major depressive disorder, recurrent, moderate: Secondary | ICD-10-CM | POA: Diagnosis not present

## 2023-01-28 DIAGNOSIS — E038 Other specified hypothyroidism: Secondary | ICD-10-CM | POA: Diagnosis not present

## 2023-01-28 DIAGNOSIS — J449 Chronic obstructive pulmonary disease, unspecified: Secondary | ICD-10-CM | POA: Diagnosis not present

## 2023-01-28 DIAGNOSIS — E559 Vitamin D deficiency, unspecified: Secondary | ICD-10-CM | POA: Diagnosis not present

## 2023-02-02 DIAGNOSIS — J449 Chronic obstructive pulmonary disease, unspecified: Secondary | ICD-10-CM | POA: Diagnosis not present

## 2023-02-02 DIAGNOSIS — F331 Major depressive disorder, recurrent, moderate: Secondary | ICD-10-CM | POA: Diagnosis not present

## 2023-02-02 DIAGNOSIS — I10 Essential (primary) hypertension: Secondary | ICD-10-CM | POA: Diagnosis not present

## 2023-02-02 DIAGNOSIS — M5442 Lumbago with sciatica, left side: Secondary | ICD-10-CM | POA: Diagnosis not present

## 2023-02-02 DIAGNOSIS — G4700 Insomnia, unspecified: Secondary | ICD-10-CM | POA: Diagnosis not present

## 2023-02-02 DIAGNOSIS — E291 Testicular hypofunction: Secondary | ICD-10-CM | POA: Diagnosis not present

## 2023-02-02 DIAGNOSIS — E559 Vitamin D deficiency, unspecified: Secondary | ICD-10-CM | POA: Diagnosis not present

## 2023-02-02 DIAGNOSIS — E782 Mixed hyperlipidemia: Secondary | ICD-10-CM | POA: Diagnosis not present

## 2023-02-23 DIAGNOSIS — J449 Chronic obstructive pulmonary disease, unspecified: Secondary | ICD-10-CM | POA: Diagnosis not present

## 2023-02-25 DIAGNOSIS — E119 Type 2 diabetes mellitus without complications: Secondary | ICD-10-CM | POA: Diagnosis not present

## 2023-02-25 DIAGNOSIS — E782 Mixed hyperlipidemia: Secondary | ICD-10-CM | POA: Diagnosis not present

## 2023-02-25 DIAGNOSIS — F331 Major depressive disorder, recurrent, moderate: Secondary | ICD-10-CM | POA: Diagnosis not present

## 2023-02-25 DIAGNOSIS — J441 Chronic obstructive pulmonary disease with (acute) exacerbation: Secondary | ICD-10-CM | POA: Diagnosis not present

## 2023-02-25 DIAGNOSIS — M810 Age-related osteoporosis without current pathological fracture: Secondary | ICD-10-CM | POA: Diagnosis not present

## 2023-02-25 DIAGNOSIS — E559 Vitamin D deficiency, unspecified: Secondary | ICD-10-CM | POA: Diagnosis not present

## 2023-02-25 DIAGNOSIS — D518 Other vitamin B12 deficiency anemias: Secondary | ICD-10-CM | POA: Diagnosis not present

## 2023-02-25 DIAGNOSIS — J449 Chronic obstructive pulmonary disease, unspecified: Secondary | ICD-10-CM | POA: Diagnosis not present

## 2023-02-25 DIAGNOSIS — E038 Other specified hypothyroidism: Secondary | ICD-10-CM | POA: Diagnosis not present

## 2023-11-10 DIAGNOSIS — R079 Chest pain, unspecified: Secondary | ICD-10-CM | POA: Diagnosis not present

## 2024-06-04 ENCOUNTER — Other Ambulatory Visit: Payer: Self-pay

## 2024-06-04 ENCOUNTER — Other Ambulatory Visit (HOSPITAL_BASED_OUTPATIENT_CLINIC_OR_DEPARTMENT_OTHER): Payer: Self-pay

## 2024-06-04 MED ORDER — AIRSUPRA 90-80 MCG/ACT IN AERO
2.0000 | INHALATION_SPRAY | Freq: Every day | RESPIRATORY_TRACT | 2 refills | Status: AC
  Filled 2024-06-04: qty 10.7, 10d supply, fill #0

## 2024-06-04 MED ORDER — SPIRIVA RESPIMAT 2.5 MCG/ACT IN AERS
2.0000 | INHALATION_SPRAY | Freq: Every day | RESPIRATORY_TRACT | 2 refills | Status: DC
  Filled 2024-06-04: qty 4, 30d supply, fill #0
  Filled 2024-06-28: qty 4, 30d supply, fill #1
  Filled 2024-08-06: qty 4, 30d supply, fill #2

## 2024-06-11 ENCOUNTER — Other Ambulatory Visit (HOSPITAL_BASED_OUTPATIENT_CLINIC_OR_DEPARTMENT_OTHER): Payer: Self-pay

## 2024-06-28 ENCOUNTER — Other Ambulatory Visit (HOSPITAL_BASED_OUTPATIENT_CLINIC_OR_DEPARTMENT_OTHER): Payer: Self-pay

## 2024-08-06 ENCOUNTER — Other Ambulatory Visit (HOSPITAL_BASED_OUTPATIENT_CLINIC_OR_DEPARTMENT_OTHER): Payer: Self-pay

## 2024-08-15 ENCOUNTER — Other Ambulatory Visit (HOSPITAL_BASED_OUTPATIENT_CLINIC_OR_DEPARTMENT_OTHER): Payer: Self-pay

## 2024-08-15 MED ORDER — AIRSUPRA 90-80 MCG/ACT IN AERO
2.0000 | INHALATION_SPRAY | Freq: Every day | RESPIRATORY_TRACT | 2 refills | Status: AC
  Filled 2024-08-15: qty 10.7, 10d supply, fill #0

## 2024-08-29 ENCOUNTER — Other Ambulatory Visit (HOSPITAL_BASED_OUTPATIENT_CLINIC_OR_DEPARTMENT_OTHER): Payer: Self-pay

## 2024-09-21 ENCOUNTER — Other Ambulatory Visit (HOSPITAL_BASED_OUTPATIENT_CLINIC_OR_DEPARTMENT_OTHER): Payer: Self-pay

## 2024-09-21 MED ORDER — SPIRIVA RESPIMAT 2.5 MCG/ACT IN AERS
2.0000 | INHALATION_SPRAY | Freq: Every day | RESPIRATORY_TRACT | 2 refills | Status: AC
  Filled 2024-09-21: qty 4, 30d supply, fill #0

## 2024-09-25 ENCOUNTER — Other Ambulatory Visit (HOSPITAL_BASED_OUTPATIENT_CLINIC_OR_DEPARTMENT_OTHER): Payer: Self-pay
